# Patient Record
Sex: Male | Born: 1945 | Race: White | Hispanic: No | Smoking: Current every day smoker
Health system: Southern US, Community
[De-identification: ages and names within clinical notes are randomized; demographics above are authoritative.]

## PROBLEM LIST (undated history)

## (undated) DIAGNOSIS — I350 Nonrheumatic aortic (valve) stenosis: Secondary | ICD-10-CM

## (undated) DIAGNOSIS — I10 Essential (primary) hypertension: Secondary | ICD-10-CM

## (undated) DIAGNOSIS — F32A Depression, unspecified: Secondary | ICD-10-CM

## (undated) DIAGNOSIS — I779 Disorder of arteries and arterioles, unspecified: Secondary | ICD-10-CM

## (undated) DIAGNOSIS — K219 Gastro-esophageal reflux disease without esophagitis: Secondary | ICD-10-CM

## (undated) DIAGNOSIS — F419 Anxiety disorder, unspecified: Secondary | ICD-10-CM

## (undated) DIAGNOSIS — R011 Cardiac murmur, unspecified: Secondary | ICD-10-CM

## (undated) DIAGNOSIS — J189 Pneumonia, unspecified organism: Secondary | ICD-10-CM

## (undated) DIAGNOSIS — I739 Peripheral vascular disease, unspecified: Secondary | ICD-10-CM

## (undated) DIAGNOSIS — K5790 Diverticulosis of intestine, part unspecified, without perforation or abscess without bleeding: Secondary | ICD-10-CM

## (undated) DIAGNOSIS — I700 Atherosclerosis of aorta: Secondary | ICD-10-CM

## (undated) DIAGNOSIS — G629 Polyneuropathy, unspecified: Secondary | ICD-10-CM

## (undated) DIAGNOSIS — I509 Heart failure, unspecified: Secondary | ICD-10-CM

## (undated) DIAGNOSIS — E119 Type 2 diabetes mellitus without complications: Secondary | ICD-10-CM

## (undated) DIAGNOSIS — N529 Male erectile dysfunction, unspecified: Secondary | ICD-10-CM

## (undated) DIAGNOSIS — J449 Chronic obstructive pulmonary disease, unspecified: Secondary | ICD-10-CM

## (undated) DIAGNOSIS — H409 Unspecified glaucoma: Secondary | ICD-10-CM

## (undated) DIAGNOSIS — R0600 Dyspnea, unspecified: Secondary | ICD-10-CM

## (undated) DIAGNOSIS — Z8639 Personal history of other endocrine, nutritional and metabolic disease: Secondary | ICD-10-CM

## (undated) DIAGNOSIS — M1990 Unspecified osteoarthritis, unspecified site: Secondary | ICD-10-CM

## (undated) DIAGNOSIS — Z8719 Personal history of other diseases of the digestive system: Secondary | ICD-10-CM

## (undated) HISTORY — DX: Diverticulosis of intestine, part unspecified, without perforation or abscess without bleeding: K57.90

## (undated) HISTORY — PX: CARDIAC CATHETERIZATION: SHX172

## (undated) HISTORY — PX: EYE SURGERY: SHX253

## (undated) HISTORY — DX: Atherosclerosis of aorta: I70.0

## (undated) HISTORY — DX: Type 2 diabetes mellitus without complications: E11.9

## (undated) HISTORY — DX: Disorder of arteries and arterioles, unspecified: I77.9

## (undated) HISTORY — DX: Essential (primary) hypertension: I10

## (undated) HISTORY — DX: Male erectile dysfunction, unspecified: N52.9

## (undated) HISTORY — PX: TONSILLECTOMY: SUR1361

## (undated) HISTORY — DX: Peripheral vascular disease, unspecified: I73.9

## (undated) HISTORY — DX: Nonrheumatic aortic (valve) stenosis: I35.0

## (undated) HISTORY — PX: HERNIA REPAIR: SHX51

---

## 2009-06-06 ENCOUNTER — Ambulatory Visit (HOSPITAL_COMMUNITY): Admission: RE | Admit: 2009-06-06 | Discharge: 2009-06-06 | Payer: Self-pay | Admitting: Family Medicine

## 2013-03-23 NOTE — H&P (Signed)
  NTS SOAP Note  Vital Signs:  Vitals as of: 03/23/2013: Systolic 198: Diastolic 119: Heart Rate 68: Temp 37F: Height 38ft 1in: Weight 174Lbs 0 Ounces: BMI 22.96  BMI : 22.96 kg/m2  Subjective: This 67 Years 31 Months old Male presents for screening TCS.  Never has had a colonoscopy.  Denies any significant gi complaints.  No family h/o colon cancer.   Review of Symptoms:  Constitutional:unremarkable   Head:unremarkable    Eyes:unremarkable   Nose/Mouth/Throat:unremarkable Cardiovascular:  unremarkable   Respiratory:  dyspnea,cough Gastrointestin    heartburn Genitourinary:    frequency Musculoskeletal:unremarkable   Skin:unremarkable Hematolgic/Lymphatic:unremarkable     Allergic/Immunologic:unremarkable     Past Medical History:    Reviewed   Past Medical History  Surgical History: hernia, hip dislocation Medical Problems:  Diabetes, High Blood pressure Allergies: nkda Medications: enalapril, timolol, lantanoprost, metformin, glipizide   Social History:Reviewed  Social History  Preferred Language: English Race:  White Ethnicity: Not Hispanic / Latino Age: 67 Years 6 Months Marital Status:  S Alcohol:  No Recreational drug(s):  No   Smoking Status: Current every day smoker reviewed on 03/23/2013 Started Date: [birth year]/30/1965 Packs per day: 2.00 Functional Status reviewed on mm/dd/yyyy ------------------------------------------------ Bathing: Normal Cooking: Normal Dressing: Normal Driving: Normal Eating: Normal Managing Meds: Normal Oral Care: Normal Shopping: Normal Toileting: Normal Transferring: Normal Walking: Normal Cognitive Status reviewed on mm/dd/yyyy ------------------------------------------------ Attention: Normal Decision Making: Normal Language: Normal Memory: Normal Motor: Normal Perception: Normal Problem Solving: Normal Visual and Spatial: Normal   Family History:  Reviewed   Family  History  Is there a family history of:No family h/o colon cancer    Objective Information: General:  Well appearing, well nourished in no distress. Heart:  RRR, no murmur Lungs:    CTA bilaterally, no wheezes, rhonchi, rales.  Breathing unlabored. Abdomen:Soft, NT/ND, no HSM, no masses.   deferred to procedure  Assessment:Need for screening TCS  Diagnosis &amp; Procedure Smart Code   Plan:Scheduled for TCS on 04/06/13.   Patient Education:Alternative treatments to surgery were discussed with patient (and family).  Risks and benefits  of procedure including bleeding and perforation were fully explained to the patient (and family) who gave informed consent. Patient/family questions were addressed.  Follow-up:Pending Surgery

## 2013-03-25 ENCOUNTER — Encounter (HOSPITAL_COMMUNITY): Payer: Self-pay | Admitting: Pharmacy Technician

## 2013-04-06 ENCOUNTER — Encounter (HOSPITAL_COMMUNITY): Payer: Self-pay

## 2013-04-06 ENCOUNTER — Ambulatory Visit (HOSPITAL_COMMUNITY)
Admission: RE | Admit: 2013-04-06 | Discharge: 2013-04-06 | Disposition: A | Discharge status: Home / Self Care | Payer: Medicare Other | Source: Ambulatory Visit | Attending: General Surgery | Admitting: General Surgery

## 2013-04-06 ENCOUNTER — Encounter (HOSPITAL_COMMUNITY)
Admission: RE | Disposition: A | Discharge status: Home / Self Care | Payer: Self-pay | Source: Ambulatory Visit | Attending: General Surgery

## 2013-04-06 DIAGNOSIS — K5730 Diverticulosis of large intestine without perforation or abscess without bleeding: Secondary | ICD-10-CM | POA: Insufficient documentation

## 2013-04-06 DIAGNOSIS — E119 Type 2 diabetes mellitus without complications: Secondary | ICD-10-CM | POA: Insufficient documentation

## 2013-04-06 DIAGNOSIS — I10 Essential (primary) hypertension: Secondary | ICD-10-CM | POA: Insufficient documentation

## 2013-04-06 DIAGNOSIS — D126 Benign neoplasm of colon, unspecified: Secondary | ICD-10-CM | POA: Insufficient documentation

## 2013-04-06 DIAGNOSIS — Z01812 Encounter for preprocedural laboratory examination: Secondary | ICD-10-CM | POA: Insufficient documentation

## 2013-04-06 DIAGNOSIS — D128 Benign neoplasm of rectum: Secondary | ICD-10-CM | POA: Insufficient documentation

## 2013-04-06 DIAGNOSIS — Z1211 Encounter for screening for malignant neoplasm of colon: Secondary | ICD-10-CM | POA: Insufficient documentation

## 2013-04-06 HISTORY — DX: Type 2 diabetes mellitus without complications: E11.9

## 2013-04-06 HISTORY — DX: Chronic obstructive pulmonary disease, unspecified: J44.9

## 2013-04-06 HISTORY — DX: Personal history of other diseases of the digestive system: Z87.19

## 2013-04-06 HISTORY — PX: COLONOSCOPY: SHX5424

## 2013-04-06 HISTORY — DX: Essential (primary) hypertension: I10

## 2013-04-06 HISTORY — DX: Gastro-esophageal reflux disease without esophagitis: K21.9

## 2013-04-06 SURGERY — COLONOSCOPY
Anesthesia: Moderate Sedation

## 2013-04-06 MED ORDER — MEPERIDINE HCL 25 MG/ML IJ SOLN
INTRAMUSCULAR | Status: DC | PRN
  Administered 2013-04-06: 50 mg via INTRAVENOUS

## 2013-04-06 MED ORDER — MEPERIDINE HCL 50 MG/ML IJ SOLN
INTRAMUSCULAR | Status: AC
  Filled 2013-04-06: qty 1

## 2013-04-06 MED ORDER — MIDAZOLAM HCL 5 MG/5ML IJ SOLN
INTRAMUSCULAR | Status: AC
  Filled 2013-04-06: qty 5

## 2013-04-06 MED ORDER — STERILE WATER FOR IRRIGATION IR SOLN
Status: DC | PRN
  Administered 2013-04-06: 09:00:00

## 2013-04-06 MED ORDER — MIDAZOLAM HCL 5 MG/5ML IJ SOLN
INTRAMUSCULAR | Status: DC | PRN
  Administered 2013-04-06: 1 mg via INTRAVENOUS
  Administered 2013-04-06: 4 mg via INTRAVENOUS

## 2013-04-06 MED ORDER — SODIUM CHLORIDE 0.9 % IV SOLN
INTRAVENOUS | Status: DC
  Administered 2013-04-06: 09:00:00 via INTRAVENOUS

## 2013-04-06 NOTE — Op Note (Signed)
[facility] [address] [place], [address]   COLONOSCOPY PROCEDURE REPORT  PATIENT: Patrick, Beck  MR#: [id number] BIRTHDATE: 01-25-1946 , 66  yrs. old GENDER: Male ENDOSCOPIST: Franky Macho, MD REFERRED [id number], Brett Canales PROCEDURE DATE:  04/06/2013 PROCEDURE:   Colonoscopy with snare polypectomy ASA CLASS:   Class II INDICATIONS:Average risk patient for colon cancer. MEDICATIONS: Versed 5 mg IV and Demerol 50 mg IV  DESCRIPTION OF PROCEDURE:   After the risks benefits and alternatives of the procedure were thoroughly explained, informed consent was obtained.  A digital rectal exam revealed a palpable rectal mass.   The EC-[id number] ([id number])  endoscope was introduced through the anus and advanced to the cecum, which was identified by both the appendix and ileocecal valve. No adverse events experienced.   The quality of the prep was adequate, using MoviPrep The instrument was then slowly withdrawn as the colon was fully examined.      COLON FINDINGS: A small sessile polyp was found in the proximal transverse colon.  A polypectomy was performed using snare cautery. The resection was complete and the polyp tissue was completely retrieved.   A small sessile polyp was found in the distal transverse colon.  A polypectomy was performed using snare cautery. The resection was complete and the polyp tissue was completely retrieved.   A large polypoid shaped pedunculated polyp was found in rectum seen upon the retroflexed view.  A polypectomy was performed using snare cautery.  The resection was complete and the polyp tissue was completely retrieved.   There was mild scattered diverticulosis noted throughout the entire examined colon.     The time to cecum=7 minutes 0 seconds.  Withdrawal time=32 minutes 0 seconds.  The scope was withdrawn and the procedure completed. COMPLICATIONS: There were no complications.  ENDOSCOPIC IMPRESSION: 1.   Small sessile polyp was  found in the proximal transverse colon; polypectomy was performed using snare cautery 2.   Small sessile polyp was found in the distal transverse colon; polypectomy was performed using snare cautery 3.   Large pedunculated polyp was found in rectum seen upon the retroflexed view; polypectomy was performed using snare cautery 4.   There was mild diverticulosis noted throughout the entire examined colon  RECOMMENDATIONS: 1.  Await pathology results 2.  Timing of repeat colonoscopy will be determined by pathology findings.   eSigned:  Franky Macho, MD 04/06/2013 10:13 AM   cc:   PATIENT NAME:  Patrick, Beck MR#: [id number]

## 2013-04-06 NOTE — Interval H&P Note (Signed)
History and Physical Interval Note:  04/06/2013 9:12 AM  Patrick Beck  has presented today for surgery, with the diagnosis of screening  The various methods of treatment have been discussed with the patient and family. After consideration of risks, benefits and other options for treatment, the patient has consented to  Procedure(s): COLONOSCOPY (N/A) as a surgical intervention .  The patient's history has been reviewed, patient examined, no change in status, stable for surgery.  I have reviewed the patient's chart and labs.  Questions were answered to the patient's satisfaction.     Franky Macho A

## 2013-04-12 ENCOUNTER — Encounter (HOSPITAL_COMMUNITY): Payer: Self-pay | Admitting: General Surgery

## 2015-09-17 ENCOUNTER — Emergency Department (HOSPITAL_COMMUNITY): Payer: Medicare Other

## 2015-09-17 ENCOUNTER — Encounter (HOSPITAL_COMMUNITY): Payer: Self-pay | Admitting: Emergency Medicine

## 2015-09-17 ENCOUNTER — Emergency Department (HOSPITAL_COMMUNITY)
Admission: EM | Admit: 2015-09-17 | Discharge: 2015-09-17 | Disposition: A | Discharge status: Home / Self Care | Payer: Medicare Other | Attending: Emergency Medicine | Admitting: Emergency Medicine

## 2015-09-17 DIAGNOSIS — Z7982 Long term (current) use of aspirin: Secondary | ICD-10-CM | POA: Insufficient documentation

## 2015-09-17 DIAGNOSIS — I10 Essential (primary) hypertension: Secondary | ICD-10-CM | POA: Diagnosis not present

## 2015-09-17 DIAGNOSIS — M25562 Pain in left knee: Secondary | ICD-10-CM

## 2015-09-17 DIAGNOSIS — E119 Type 2 diabetes mellitus without complications: Secondary | ICD-10-CM | POA: Diagnosis not present

## 2015-09-17 DIAGNOSIS — Z720 Tobacco use: Secondary | ICD-10-CM | POA: Insufficient documentation

## 2015-09-17 DIAGNOSIS — W010XXA Fall on same level from slipping, tripping and stumbling without subsequent striking against object, initial encounter: Secondary | ICD-10-CM | POA: Insufficient documentation

## 2015-09-17 DIAGNOSIS — Z8719 Personal history of other diseases of the digestive system: Secondary | ICD-10-CM | POA: Diagnosis not present

## 2015-09-17 DIAGNOSIS — Y9389 Activity, other specified: Secondary | ICD-10-CM | POA: Insufficient documentation

## 2015-09-17 DIAGNOSIS — Y9289 Other specified places as the place of occurrence of the external cause: Secondary | ICD-10-CM | POA: Diagnosis not present

## 2015-09-17 DIAGNOSIS — J449 Chronic obstructive pulmonary disease, unspecified: Secondary | ICD-10-CM | POA: Insufficient documentation

## 2015-09-17 DIAGNOSIS — Y998 Other external cause status: Secondary | ICD-10-CM | POA: Diagnosis not present

## 2015-09-17 DIAGNOSIS — Z79899 Other long term (current) drug therapy: Secondary | ICD-10-CM | POA: Diagnosis not present

## 2015-09-17 DIAGNOSIS — S8992XA Unspecified injury of left lower leg, initial encounter: Secondary | ICD-10-CM | POA: Diagnosis present

## 2015-09-17 DIAGNOSIS — W19XXXA Unspecified fall, initial encounter: Secondary | ICD-10-CM

## 2015-09-17 NOTE — Discharge Instructions (Signed)
Crutches, ice, elevate, Tylenol. Recommend knee brace at [facility] or [facility].  Follow-up with orthopedic doctor if not getting better. Phone number given.

## 2015-09-17 NOTE — ED Provider Notes (Signed)
CSN: [id number]     Arrival date & time 09/17/15  8341 History  This chart was scribed for Nat Christen, MD by Stephania Fragmin, ED Scribe. This patient was seen in room APA04/APA04 and the patient's care was started at 9:21 AM.   Chief Complaint  Patient presents with  . Fall   The history is provided by the patient. No language interpreter was used.    HPI Comments: Lawrnce Beck is a 69 y.o. male with a history of HTN, COPD, and DM, who presents to the Emergency Department S/P tripping and falling yesterday afternoon, complaining of constant, moderate, worsening left knee pain and swelling. Patient states he tripped over wires in his friend's backyard from standing height, falling on both knees and left wrist. He denies any other injuries, including wrist pain or any complaints with his right knee.   Past Medical History  Diagnosis Date  . Hypertension   . COPD (chronic obstructive pulmonary disease)   . Diabetes mellitus without complication   . GERD (gastroesophageal reflux disease)   . H/O hiatal hernia    Past Surgical History  Procedure Laterality Date  . Hernia repair      times 3 all before 1966  . Colonoscopy N/A 04/06/2013    Procedure: COLONOSCOPY;  Surgeon: Jamesetta So, MD;  Location: [facility];  Service: Gastroenterology;  Laterality: N/A;   History reviewed. No pertinent family history. Social History  Substance Use Topics  . Smoking status: Heavy Tobacco Smoker -- 2.00 packs/day for 50 years    Types: Cigarettes  . Smokeless tobacco: None  . Alcohol Use: No    Review of Systems  Musculoskeletal: Positive for joint swelling and arthralgias (left knee).  All other systems reviewed and are negative.  Allergies  Macadamia nut oil  Home Medications   Prior to Admission medications   Medication Sig Start Date End Date Taking? Authorizing Avion Patella  aspirin 325 MG EC tablet Take 325 mg by mouth daily.   Yes Historical Vitalia Stough, MD  enalapril (VASOTEC) 10 MG tablet  Take 5 mg by mouth 2 (two) times daily.    Yes Historical Ayumi Wangerin, MD  glimepiride (AMARYL) 2 MG tablet Take 2 mg by mouth daily before breakfast.   Yes Historical Gerardine Peltz, MD  ibuprofen (ADVIL,MOTRIN) 200 MG tablet Take 800 mg by mouth every 6 (six) hours as needed for mild pain.   Yes Historical Umar Patmon, MD  metFORMIN (GLUCOPHAGE) 1000 MG tablet Take 1,000 mg by mouth 2 (two) times daily with a meal.   Yes Historical Hajra Port, MD  pravastatin (PRAVACHOL) 40 MG tablet Take 40 mg by mouth daily.   Yes Historical Aubrie Lucien, MD  timolol (BETIMOL) 0.5 % ophthalmic solution Place 1 drop into both eyes daily.   Yes Historical Senie Lanese, MD   BP 146/93 mmHg  Pulse 70  Temp(Src) 97.9 F (36.6 C) (Oral)  Resp 18  Ht 6\' 1"  (1.854 m)  Wt 166 lb (75.297 kg)  BMI 21.91 kg/m2  SpO2 97% Physical Exam  Constitutional: He is oriented to person, place, and time. He appears well-developed and well-nourished. No distress.  HENT:  Head: Normocephalic and atraumatic.  Eyes: Conjunctivae and EOM are normal.  Neck: Neck supple. No tracheal deviation present.  Cardiovascular: Normal rate.   Pulmonary/Chest: Effort normal. No respiratory distress.  Musculoskeletal: Normal range of motion. He exhibits tenderness.  Swelling in distal femur and distal quadriceps. Diffusely tender throughout knee.   Neurological: He is alert and oriented to person, place, and  time.  Skin: Skin is warm and dry.  Psychiatric: He has a normal mood and affect. His behavior is normal.  Nursing note and vitals reviewed.   ED Course  Procedures (including critical care time)  DIAGNOSTIC STUDIES: Oxygen Saturation is 100% on RA, normal by my interpretation.    COORDINATION OF CARE: 9:31 AM - Discussed treatment plan with pt at bedside which includes elevation, ice, pain management, rest, and time. Will perform left knee XR to r/o bony involvement.  Pt verbalized understanding and agreed to plan.   10:57 AM - XR findings  reviewed with patient. Reiterated treatment plan with pt at bedside. Will give crutches. Will also prescribe Rx Flexeril for pain. Pt verbalized understanding and agreed to plan.  Imaging Review No results found. I have personally reviewed and evaluated these images and lab results as part of my medical decision-making.  MDM   Final diagnoses:  Fall, initial encounter  Left knee pain   x-ray of left knee shows a joint effusion with no fracture. Ice, elevate, compression, referral to orthopedics.  I personally performed the services described in this documentation, which was scribed in my presence. The recorded information has been reviewed and is accurate.     Nat Christen, MD 09/19/15 [phone]

## 2015-09-17 NOTE — ED Notes (Signed)
Fell yesterday on both knees but only c/o pain to left knee.  Rates pain 8/10.  Took Advil without relief.  Caught self with right hand and both knees.

## 2015-09-17 NOTE — ED Notes (Signed)
Pt made aware to return if symptoms worsen or if any life threatening symptoms occur.   

## 2017-10-15 ENCOUNTER — Other Ambulatory Visit (HOSPITAL_COMMUNITY): Payer: Self-pay | Admitting: Family Medicine

## 2017-10-15 ENCOUNTER — Ambulatory Visit (HOSPITAL_COMMUNITY)
Admission: RE | Admit: 2017-10-15 | Discharge: 2017-10-15 | Disposition: A | Discharge status: Home / Self Care | Payer: Medicare Other | Source: Ambulatory Visit | Attending: Family Medicine | Admitting: Family Medicine

## 2017-10-15 DIAGNOSIS — J449 Chronic obstructive pulmonary disease, unspecified: Secondary | ICD-10-CM | POA: Diagnosis not present

## 2017-10-15 DIAGNOSIS — R799 Abnormal finding of blood chemistry, unspecified: Secondary | ICD-10-CM

## 2017-10-15 DIAGNOSIS — R918 Other nonspecific abnormal finding of lung field: Secondary | ICD-10-CM | POA: Insufficient documentation

## 2017-10-21 ENCOUNTER — Other Ambulatory Visit (HOSPITAL_COMMUNITY): Payer: Self-pay | Admitting: Family Medicine

## 2017-10-21 DIAGNOSIS — R9389 Abnormal findings on diagnostic imaging of other specified body structures: Secondary | ICD-10-CM

## 2017-10-21 DIAGNOSIS — B59 Pneumocystosis: Secondary | ICD-10-CM

## 2017-10-22 ENCOUNTER — Ambulatory Visit (HOSPITAL_COMMUNITY)
Admission: RE | Admit: 2017-10-22 | Discharge: 2017-10-22 | Disposition: A | Discharge status: Home / Self Care | Payer: Medicare Other | Source: Ambulatory Visit | Attending: Family Medicine | Admitting: Family Medicine

## 2017-10-22 ENCOUNTER — Encounter (HOSPITAL_COMMUNITY): Payer: Self-pay

## 2017-10-22 ENCOUNTER — Inpatient Hospital Stay (HOSPITAL_COMMUNITY)
Admission: AD | Admit: 2017-10-22 | Discharge: 2017-10-24 | DRG: 194 | Disposition: A | Discharge status: Home / Self Care | Payer: Medicare Other | Source: Ambulatory Visit | Attending: Family Medicine | Admitting: Family Medicine

## 2017-10-22 DIAGNOSIS — Z23 Encounter for immunization: Secondary | ICD-10-CM | POA: Diagnosis not present

## 2017-10-22 DIAGNOSIS — R9389 Abnormal findings on diagnostic imaging of other specified body structures: Secondary | ICD-10-CM

## 2017-10-22 DIAGNOSIS — F17200 Nicotine dependence, unspecified, uncomplicated: Secondary | ICD-10-CM | POA: Diagnosis not present

## 2017-10-22 DIAGNOSIS — J984 Other disorders of lung: Secondary | ICD-10-CM

## 2017-10-22 DIAGNOSIS — Z7982 Long term (current) use of aspirin: Secondary | ICD-10-CM | POA: Diagnosis not present

## 2017-10-22 DIAGNOSIS — E119 Type 2 diabetes mellitus without complications: Secondary | ICD-10-CM | POA: Diagnosis present

## 2017-10-22 DIAGNOSIS — J440 Chronic obstructive pulmonary disease with acute lower respiratory infection: Secondary | ICD-10-CM | POA: Diagnosis present

## 2017-10-22 DIAGNOSIS — I10 Essential (primary) hypertension: Secondary | ICD-10-CM | POA: Diagnosis present

## 2017-10-22 DIAGNOSIS — J189 Pneumonia, unspecified organism: Principal | ICD-10-CM | POA: Diagnosis present

## 2017-10-22 DIAGNOSIS — Z66 Do not resuscitate: Secondary | ICD-10-CM | POA: Diagnosis present

## 2017-10-22 DIAGNOSIS — J449 Chronic obstructive pulmonary disease, unspecified: Secondary | ICD-10-CM | POA: Diagnosis not present

## 2017-10-22 DIAGNOSIS — B59 Pneumocystosis: Secondary | ICD-10-CM

## 2017-10-22 DIAGNOSIS — H409 Unspecified glaucoma: Secondary | ICD-10-CM | POA: Diagnosis present

## 2017-10-22 DIAGNOSIS — F17210 Nicotine dependence, cigarettes, uncomplicated: Secondary | ICD-10-CM | POA: Diagnosis present

## 2017-10-22 DIAGNOSIS — Z79899 Other long term (current) drug therapy: Secondary | ICD-10-CM | POA: Diagnosis not present

## 2017-10-22 DIAGNOSIS — Z825 Family history of asthma and other chronic lower respiratory diseases: Secondary | ICD-10-CM

## 2017-10-22 DIAGNOSIS — Z809 Family history of malignant neoplasm, unspecified: Secondary | ICD-10-CM | POA: Diagnosis not present

## 2017-10-22 DIAGNOSIS — K219 Gastro-esophageal reflux disease without esophagitis: Secondary | ICD-10-CM | POA: Diagnosis present

## 2017-10-22 DIAGNOSIS — Z7984 Long term (current) use of oral hypoglycemic drugs: Secondary | ICD-10-CM | POA: Diagnosis not present

## 2017-10-22 HISTORY — DX: Unspecified glaucoma: H40.9

## 2017-10-22 LAB — BASIC METABOLIC PANEL
ANION GAP: 11 (ref 5–15)
BUN: 18 mg/dL (ref 6–20)
CHLORIDE: 96 mmol/L — AB (ref 101–111)
CO2: 27 mmol/L (ref 22–32)
Calcium: 8.1 mg/dL — ABNORMAL LOW (ref 8.9–10.3)
Creatinine, Ser: 0.99 mg/dL (ref 0.61–1.24)
GFR calc non Af Amer: >60 mL/min (ref >60)
Glucose, Bld: 103 mg/dL — ABNORMAL HIGH (ref 65–99)
Potassium: 3.7 mmol/L (ref 3.5–5.1)
Sodium: 134 mmol/L — ABNORMAL LOW (ref 135–145)

## 2017-10-22 LAB — EXPECTORATED SPUTUM ASSESSMENT W GRAM STAIN, RFLX TO RESP C: Special Requests: NORMAL

## 2017-10-22 LAB — GLUCOSE, CAPILLARY: Glucose-Capillary: 176 mg/dL — ABNORMAL HIGH (ref 65–99)

## 2017-10-22 MED ORDER — ENOXAPARIN SODIUM 40 MG/0.4ML ~~LOC~~ SOLN
40.0000 mg | SUBCUTANEOUS | Status: DC
  Administered 2017-10-22 – 2017-10-23 (×2): 40 mg via SUBCUTANEOUS
  Filled 2017-10-22 (×2): qty 0.4

## 2017-10-22 MED ORDER — INSULIN ASPART 100 UNIT/ML ~~LOC~~ SOLN: 0.0000 [IU] | Freq: Every day | SUBCUTANEOUS | Status: DC

## 2017-10-22 MED ORDER — IOPAMIDOL (ISOVUE-300) INJECTION 61%
75.0000 mL | Freq: Once | INTRAVENOUS | Status: AC | PRN
  Administered 2017-10-22: 75 mL via INTRAVENOUS

## 2017-10-22 MED ORDER — INSULIN ASPART 100 UNIT/ML ~~LOC~~ SOLN
0.0000 [IU] | Freq: Three times a day (TID) | SUBCUTANEOUS | Status: DC
  Administered 2017-10-23: 2 [IU] via SUBCUTANEOUS
  Administered 2017-10-23: 5 [IU] via SUBCUTANEOUS
  Administered 2017-10-24: 2 [IU] via SUBCUTANEOUS

## 2017-10-22 MED ORDER — PIPERACILLIN-TAZOBACTAM 3.375 G IVPB
3.3750 g | Freq: Three times a day (TID) | INTRAVENOUS | Status: DC
  Administered 2017-10-22 – 2017-10-24 (×5): 3.375 g via INTRAVENOUS
  Filled 2017-10-22 (×6): qty 50

## 2017-10-22 MED ORDER — ALBUTEROL SULFATE (2.5 MG/3ML) 0.083% IN NEBU: 2.5000 mg | INHALATION_SOLUTION | RESPIRATORY_TRACT | Status: DC | PRN

## 2017-10-22 MED ORDER — PNEUMOCOCCAL VAC POLYVALENT 25 MCG/0.5ML IJ INJ
0.5000 mL | INJECTION | INTRAMUSCULAR | Status: AC
  Administered 2017-10-23: 0.5 mL via INTRAMUSCULAR

## 2017-10-22 MED ORDER — IPRATROPIUM-ALBUTEROL 0.5-2.5 (3) MG/3ML IN SOLN
3.0000 mL | Freq: Four times a day (QID) | RESPIRATORY_TRACT | Status: DC
  Administered 2017-10-22 – 2017-10-23 (×2): 3 mL via RESPIRATORY_TRACT
  Filled 2017-10-22 (×3): qty 3

## 2017-10-22 MED ORDER — PRAVASTATIN SODIUM 40 MG PO TABS
40.0000 mg | ORAL_TABLET | Freq: Every day | ORAL | Status: DC
  Administered 2017-10-23 – 2017-10-24 (×2): 40 mg via ORAL
  Filled 2017-10-22 (×2): qty 1

## 2017-10-22 MED ORDER — LATANOPROST 0.005 % OP SOLN
1.0000 [drp] | Freq: Every day | OPHTHALMIC | Status: DC
  Administered 2017-10-23: 1 [drp] via OPHTHALMIC
  Filled 2017-10-22: qty 2.5

## 2017-10-22 MED ORDER — TIMOLOL MALEATE 0.5 % OP SOLN
1.0000 [drp] | Freq: Every day | OPHTHALMIC | Status: DC
  Filled 2017-10-22: qty 5

## 2017-10-22 MED ORDER — ASPIRIN EC 325 MG PO TBEC
325.0000 mg | DELAYED_RELEASE_TABLET | Freq: Every day | ORAL | Status: DC
  Administered 2017-10-22 – 2017-10-24 (×3): 325 mg via ORAL
  Filled 2017-10-22 (×3): qty 1

## 2017-10-22 MED ORDER — SODIUM CHLORIDE 0.9 % IV SOLN
INTRAVENOUS | Status: DC
  Administered 2017-10-22 – 2017-10-24 (×2): via INTRAVENOUS

## 2017-10-22 MED ORDER — GUAIFENESIN-CODEINE 100-10 MG/5ML PO SOLN
10.0000 mL | ORAL | Status: DC | PRN
  Administered 2017-10-23 – 2017-10-24 (×3): 10 mL via ORAL
  Filled 2017-10-22 (×3): qty 10

## 2017-10-22 MED ORDER — TIMOLOL HEMIHYDRATE 0.5 % OP SOLN: 1.0000 [drp] | Freq: Every day | OPHTHALMIC | Status: DC

## 2017-10-22 MED ORDER — ASPIRIN EC 325 MG PO TBEC: 325.0000 mg | DELAYED_RELEASE_TABLET | Freq: Every day | ORAL | Status: DC

## 2017-10-22 NOTE — Progress Notes (Signed)
Dr. Lorin Mercy paged and made aware that no abx were ordered for this pt with diagnosis of PNA. Waiting for orders.

## 2017-10-22 NOTE — H&P (Signed)
History and Physical    Ammar Moffatt YKD:[id number] DOB: [birth year] DOA: 10/22/2017  PCP: Lemmie Evens, MD Consultants:  Gershon Crane - ophthalmology Patient coming from:  Home - lives with wife; [facility]: wife, [phone]  Chief Complaint: chest congestion  HPI: Patrick Beck is a 71 y.o. male with medical history significant of HTN, Glaucoma, GERD, DM, and COPD (not on home O2) presenting with chest congestion, getting worse and worse and worse.  Symptoms started about a month ago. He saw Dr. Karie Kirks when his symptoms started and his BP was 93/63.  He did not receive antibiotics at that time.  He reports that it "feels like a pleurisy", which he has had on and off for years, but this is the worst it's ever been and the longest it's ever lasted.  He usually treats it with Theraflu and Advil with resolution after a couple of days, but this time it is not getting better and actually getting worse.  He ended up with chest x-rays and then a CT.   + Bad night sweats, bad chills occasionally for 45-60 mintues.  ?fever - no thermometer.  Weight loss but also anorexia.  +productive cough - either very thick and clear or very thick and cloudy white.  No blood in his sputum.  No sick contacts.    +SOB with ambulation.  When he walks the dog outside, he has to sit outside for 15 minutes before he can take her back in; this has been getting worse.  He was given Levaquin 2 days ago and took it the last 2 days (none today) without improvement.  He was seen again by Dr. Karie Kirks today after his CT and was sent for direct admission.    Review of Systems: As per HPI; otherwise review of systems reviewed and negative.   Ambulatory Status:  Ambulates without assistance  Past Medical History:  Diagnosis Date  . COPD (chronic obstructive pulmonary disease) (HCC)    not on home O2  . Diabetes mellitus without complication ([place])   . GERD (gastroesophageal reflux disease)   . Glaucoma   . H/O hiatal hernia   .  Hypertension     Past Surgical History:  Procedure Laterality Date  . COLONOSCOPY N/A 04/06/2013   Procedure: COLONOSCOPY;  Surgeon: Jamesetta So, MD;  Location: [facility];  Service: Gastroenterology;  Laterality: N/A;  . HERNIA REPAIR     times 3 all before 1966    Social History   Social History  . Marital status: Divorced    Spouse name: N/A  . Number of children: N/A  . Years of education: N/A   Occupational History  . retired    Social History Main Topics  . Smoking status: Heavy Tobacco Smoker    Packs/day: 2.00    Years: 56.00    Types: Cigarettes  . Smokeless tobacco: Never Used     Comment: pt refuses hand outs   . Alcohol use No  . Drug use: No  . Sexual activity: Not on file   Other Topics Concern  . Not on file   Social History Narrative  . No narrative on file    Allergies  Allergen Reactions  . Macadamia Nut Oil     Throat swelling and can't breathe    Family History  Problem Relation Age of Onset  . Cancer Neg Hx     Prior to Admission medications   Medication Sig Start Date End Date Taking? Authorizing Provider  aspirin 325 MG EC  tablet Take 325 mg by mouth daily.   Yes [provider]  enalapril (VASOTEC) 10 MG tablet Take 5 mg by mouth 2 (two) times daily.    Yes [provider]  glimepiride (AMARYL) 2 MG tablet Take 2 mg by mouth daily before breakfast.   Yes [provider]  metFORMIN (GLUCOPHAGE) 1000 MG tablet Take 1,000 mg by mouth 2 (two) times daily with a meal.   Yes [provider]  pravastatin (PRAVACHOL) 40 MG tablet Take 40 mg by mouth daily.   Yes [provider]  timolol (BETIMOL) 0.5 % ophthalmic solution Place 1 drop into both eyes daily.   Yes [provider]  ibuprofen (ADVIL,MOTRIN) 200 MG tablet Take 800 mg by mouth every 6 (six) hours as needed for mild pain.    [provider]    Physical Exam: Vitals:   10/22/17 1620  BP: 116/68  Pulse: 99    Resp: 12  Temp: 98.2 F (36.8 C)  TempSrc: Oral  SpO2: 96%  Weight: 65.1 kg (143 lb 7 oz)  Height: 6\' 1"  (1.854 m)     General:  Appears calm and comfortable and is NAD Eyes:  PERRL, EOMI, normal lids, iris ENT:  grossly normal hearing, lips & tongue, mmm; very poor dentition but without apparent abscess Neck:  no LAD, masses or thyromegaly Cardiovascular:  RRR, no m/r/g. No LE edema.  Respiratory:   CTA bilaterally with no wheezes/rales/rhonchi. Diminished air movement in LUL.  Normal respiratory effort. Abdomen:  soft, NT, ND, NABS Back:   normal alignment, no CVAT Skin:  no rash or induration seen on limited exam Musculoskeletal:  grossly normal tone BUE/BLE, good ROM, no bony abnormality Lower extremity:  Trace pedal edema. 2+ distal pulses. Psychiatric:  grossly normal mood and affect, speech fluent and appropriate, AOx3 Neurologic:  CN 2-12 grossly intact, moves all extremities in coordinated fashion, sensation intact    Radiological Exams on Admission: Ct Chest W Contrast  Result Date: 10/22/2017 CLINICAL DATA:  Abnormal chest radiograph EXAM: CT CHEST WITH CONTRAST TECHNIQUE: Multidetector CT imaging of the chest was performed during intravenous contrast administration. CONTRAST:  73mL ISOVUE-300 IOPAMIDOL (ISOVUE-300) INJECTION 61% COMPARISON:  10/15/2017 FINDINGS: Cardiovascular: The heart size appears within normal. No pericardial effusion identified. Calcification in the LAD coronary artery identified. No pericardial effusion. Mediastinum/Nodes: Right paratracheal node is enlarged measuring 2.2 cm. There is a left-sided pre-vascular lymph node which measures 1.3 cm, image 73 of series 2. Enlarged left hilar node measures 1.4 cm, image 86 of series 2. No enlarged supraclavicular or axillary adenopathy. The trachea is patent and appears midline. Normal appearance of the esophagus. Lungs/Pleura: Moderate changes of centrilobular paraseptal emphysema. No pleural effusion.  There is a large area of masslike consolidation with central cavitation involving the anterior and anteromedial left upper lobe measuring 7.6 x 10.6 by 9.1 cm. Additional patchy areas of ground-glass attenuation scattered throughout the left lung. A second cavitary process is identified within the central aspect of the posteromedial right lower lobe measuring 3.1 x 1.7 by 1.8 cm. A few scattered subcentimeter solid-appearing nodules are identified in both lungs and are nonspecific. Within the medial right upper lobe there is a solid nodule measuring 5 mm, image 39 of series 4. Upper Abdomen: Low-attenuation structure in the left lobe of liver likely represents a small cyst. There is no acute abnormality noted within the upper abdomen. Musculoskeletal: No aggressive lytic or sclerotic bone lesions identified. IMPRESSION: 1. Large cavitary mass within the  left upper lobe is identified. In the acute setting, in a patient with signs and symptoms of infection, findings are compatible with necrotizing pneumonia. This would be difficult to distinguish from necrotic tumor and followup imaging following appropriate antibiotic therapy is advised to ensure resolution. 2. A second smaller area of cavitation is identified within the central portion of the posterior and medial right lower lobe. Again, findings are compatible with either necrotizing pneumonia or tumor and follow-up imaging is advised following appropriate antibiotic therapy to ensure resolution. 3. Enlarged mediastinal and hilar lymph nodes. In the setting of infection these may be reactive in etiology. Cannot exclude metastatic adenopathy and attention to these nodes on follow-up imaging is advised. 4. Aortic Atherosclerosis (ICD10-I70.0) and Emphysema (ICD10-J43.9). LAD coronary artery calcifications noted. These results were called by telephone at the time of interpretation on 10/22/2017 at 9:44 am to Lenon Oms NP who verbally acknowledged these results.  Electronically Signed   By: Kerby Moors M.D.   On: 10/22/2017 09:46    EKG: not done   Labs on Admission: I have personally reviewed the available labs and imaging studies at the time of the admission.  Pertinent labs:   None   Assessment/Plan Principal Problem:   Cavitary pneumonia Active Problems:   COPD (chronic obstructive pulmonary disease) (HCC)   Tobacco dependence   Type 2 diabetes mellitus without complication (HCC)   Essential hypertension   Glaucoma   Cavitary lung lesion -Persistent respiratory symptoms for about 1 month -Also with B symptoms -CXR on 10/17 concerning for PNA - ordered Levaquin and he took 2 days' worth -CT today shows 2 cavitary lesions with LAD - concerning for PNA vs. Malignancy -He does not have significant symptoms suggestive of PNA such as other infectious symptoms including fever -Aspiration PNA, poor dentition may be contributing factors -His age alone gives him a CURB-65 score of 1 -He is hemodynamically stable and not requiring O2 -Based on 2 days of Levaquin, this marginally qualifies for failure of outpatient therapy. -Will start Zosyn for now for empiric coverage of aspiration PNA and other anaerobic causes of cavitary lesions. -NS @ 75cc/hr -Fever control -Repeat CBC in am -Sputum cultures -Blood cultures -Strep pneumo testing -albuterol PRN -Standing Duonebs -Robitussin AC for cough -In addition to PNA, there is at least as great a concern that this is due to an underlying malignancy. -Will consult Dr. Luan Pulling, pulmonology, as the patient may benefit from bronchoscopy for further evaluation.  COPD -Despite a large tobacco pack-year history, he does not appear to be taking any medications for COPD. -He denies wheezing -Will add Duonebs with prn albuterol to see if this brings improvement to his cough. -No hypoxia.  DM -Will check A1c -hold Glucophage and Amaryl -Cover with sensitive-scale SSI  HTN -Patient brought  in his medication bottles and I reviewed them but did not see enalapril among them -BP is appropriate for now -Will hold enalapril with plan to resume if needed  Tobacco dependence -Tobacco Dependence: encourage cessation.  This was discussed with the patient and should be reviewed on an ongoing basis.   -Patch declined by patient.  Glaucoma -Patient was very clear about latanoprost and had his home bottle at bedside -He did not mention Timolol or have this bottle with him but it was listed by pharmacy on his medication list -will order Timolol for now but if patient refuses will discontinue  DVT prophylaxis: Lovenox Code Status:  DNR - confirmed with patient/family Family Communication: Wife present throughout  evaluation  Disposition Plan:  Home once clinically improved Consults called: Pulmonology  Admission status: Admit - It is my clinical opinion that admission to INPATIENT is reasonable and necessary because this patient will require at least 2 midnights in the hospital to treat this condition based on the medical complexity of the problems presented.  Given the aforementioned information, the predictability of an adverse outcome is felt to be significant.    Karmen Bongo MD [facility]  If note is complete, please contact covering daytime or nighttime physician. [email or web address] Password [facility]  10/22/2017, 8:39 PM

## 2017-10-22 NOTE — Progress Notes (Signed)
Pharmacy Antibiotic Note  Patrick Beck is a 71 y.o. male admitted on 10/22/2017 with pneumonia.  Pharmacy has been consulted for Zosyn dosing.  Plan: Zosyn 3.375g IV q8h (4 hour infusion).  Monitor labs, micro and vitals.   Height: 6\' 1"  (185.4 cm) Weight: 143 lb 7 oz (65.1 kg) IBW/kg (Calculated) : 79.9  Temp (24hrs), Avg:98.2 F (36.8 C), Min:98.2 F (36.8 C), Max:98.2 F (36.8 C)  No results for input(s): WBC, CREATININE, LATICACIDVEN, VANCOTROUGH, VANCOPEAK, VANCORANDOM, GENTTROUGH, GENTPEAK, GENTRANDOM, TOBRATROUGH, TOBRAPEAK, TOBRARND, AMIKACINPEAK, AMIKACINTROU, AMIKACIN in the last 168 hours.  CrCl cannot be calculated (No order found.).    Allergies  Allergen Reactions  . Macadamia Nut Oil     Throat swelling and can't breathe    Antimicrobials this admission: Zosyn 10/24 >>    >>   Dose adjustments this admission: N/a, renal labs pending  Microbiology results: 10/24 BCx: pending  UCx:   10/24 Sputum: pending   MRSA PCR:  10/24 Strep pneumoniae UA: pending  Thank you for allowing pharmacy to be a part of this patient's care.  Pricilla Larsson 10/22/2017 7:42 PM

## 2017-10-23 DIAGNOSIS — E119 Type 2 diabetes mellitus without complications: Secondary | ICD-10-CM

## 2017-10-23 DIAGNOSIS — J189 Pneumonia, unspecified organism: Principal | ICD-10-CM

## 2017-10-23 DIAGNOSIS — I10 Essential (primary) hypertension: Secondary | ICD-10-CM

## 2017-10-23 DIAGNOSIS — H409 Unspecified glaucoma: Secondary | ICD-10-CM

## 2017-10-23 DIAGNOSIS — F17200 Nicotine dependence, unspecified, uncomplicated: Secondary | ICD-10-CM

## 2017-10-23 DIAGNOSIS — J449 Chronic obstructive pulmonary disease, unspecified: Secondary | ICD-10-CM

## 2017-10-23 DIAGNOSIS — J984 Other disorders of lung: Secondary | ICD-10-CM

## 2017-10-23 LAB — CBC WITH DIFFERENTIAL/PLATELET
BASOS ABS: 0 10*3/uL (ref 0.0–0.1)
BASOS PCT: 0 %
Eosinophils Absolute: 0.2 10*3/uL (ref 0.0–0.7)
Eosinophils Relative: 1 %
HEMATOCRIT: 32.4 % — AB (ref 39.0–52.0)
HEMOGLOBIN: 10.4 g/dL — AB (ref 13.0–17.0)
LYMPHS PCT: 13 %
Lymphs Abs: 2 10*3/uL (ref 0.7–4.0)
MCH: 30 pg (ref 26.0–34.0)
MCHC: 32.1 g/dL (ref 30.0–36.0)
MCV: 93.4 fL (ref 78.0–100.0)
Monocytes Absolute: 1.1 10*3/uL — ABNORMAL HIGH (ref 0.1–1.0)
Monocytes Relative: 8 %
NEUTROS ABS: 11.6 10*3/uL — AB (ref 1.7–7.7)
NEUTROS PCT: 78 %
Platelets: 521 10*3/uL — ABNORMAL HIGH (ref 150–400)
RBC: 3.47 MIL/uL — ABNORMAL LOW (ref 4.22–5.81)
RDW: 14 % (ref 11.5–15.5)
WBC: 14.9 10*3/uL — ABNORMAL HIGH (ref 4.0–10.5)

## 2017-10-23 LAB — STREP PNEUMONIAE URINARY ANTIGEN: Strep Pneumo Urinary Antigen: NEGATIVE

## 2017-10-23 LAB — BASIC METABOLIC PANEL
ANION GAP: 8 (ref 5–15)
BUN: 16 mg/dL (ref 6–20)
CALCIUM: 7.6 mg/dL — AB (ref 8.9–10.3)
CO2: 31 mmol/L (ref 22–32)
Chloride: 96 mmol/L — ABNORMAL LOW (ref 101–111)
Creatinine, Ser: 1.01 mg/dL (ref 0.61–1.24)
Glucose, Bld: 66 mg/dL (ref 65–99)
POTASSIUM: 3.9 mmol/L (ref 3.5–5.1)
Sodium: 135 mmol/L (ref 135–145)

## 2017-10-23 LAB — GLUCOSE, CAPILLARY
GLUCOSE-CAPILLARY: 72 mg/dL (ref 65–99)
GLUCOSE-CAPILLARY: 161 mg/dL — AB (ref 65–99)
Glucose-Capillary: 297 mg/dL — ABNORMAL HIGH (ref 65–99)

## 2017-10-23 MED ORDER — TRAZODONE HCL 50 MG PO TABS
50.0000 mg | ORAL_TABLET | Freq: Once | ORAL | Status: AC | PRN
  Administered 2017-10-23: 50 mg via ORAL
  Filled 2017-10-23: qty 1

## 2017-10-23 MED ORDER — NON FORMULARY: 6.0000 mg | Freq: Once | Status: DC | PRN

## 2017-10-23 NOTE — Progress Notes (Signed)
Initial Nutrition Assessment  DOCUMENTATION CODES:      INTERVENTION:   High protein snack q hs   NUTRITION DIAGNOSIS:   Increased nutrient needs   GOAL:  .go Patient will meet greater than or equal to 90% of their needs   MONITOR:   PO intake, Labs, Weight trends  REASON FOR ASSESSMENT:   Malnutrition Screening Tool    ASSESSMENT:  71 yo male with cavitary pneumonia. He has a hx of COPD, DM, HTN, GERD and smokes 2 ppd cigarettes.  His home diet is "healthy". He and spouse say they eat very little processed foods. Meals usually include protein vegetable and starch. His appetite has been very good up until the past week when he has not been eating as much. Expect that chart weight may be erroneous. His weight at the Dr. office yesterday 150 lb. He weighed 166 lbs 2 years ago. His weight has gradually "fallen off". He appears thin and has mild buccal fat depletion and temporal muscle loss.    Recent Labs Lab 10/22/17 1840 10/23/17 0407  NA 134* 135  K 3.7 3.9  CL 96* 96*  CO2 27 31  BUN 18 16  CREATININE 0.99 1.01  CALCIUM 8.1* 7.6*  GLUCOSE 103* 66   Labs and meds reviewed.   Diet Order:  Diet Carb Modified Fluid consistency: Thin; Room service appropriate? Yes  Skin:  Reviewed, no issues  Last BM:  10/24  Height:   Ht Readings from Last 1 Encounters:  10/22/17 6\' 1"  (1.854 m)    Weight:   Wt Readings from Last 1 Encounters:  10/22/17 143 lb 7 oz (65.1 kg)    Ideal Body Weight:  84 kg  BMI:  Body mass index is 18.92 kg/m.  Estimated Nutritional Needs:   Kcal:  1950-2145 (30-33 kcal/kg)  Protein:  78-91 (1.2-1.4 gr/kg)  Fluid:  1.9-2.0 liters daily  EDUCATION NEEDS: encouraged continued heart healthy diet. Based on diet hx pt is compliant with carbohydrate modified diet  Education needs addressed Colman Cater MS,RD,CSG,LDN Office: [phone] Pager: [phone]

## 2017-10-23 NOTE — Consult Note (Signed)
Consult requested by: [facility] Consult requested for: Cavitary pneumonia  HPI: This is a 71 year old who's had an approximately month-long history of chest congestion cough productive of clear sputum. He has been having chills at home. He has not checked his temperature. He has a little bit of chest pain. He is more short of breath than usual and does have COPD. He's not had nausea vomiting or diarrhea. No headache. He has some nasal drainage. His blood sugar has been out of control which is unusual for him.  Past Medical History:  Diagnosis Date  . COPD (chronic obstructive pulmonary disease) (HCC)    not on home O2  . Diabetes mellitus without complication ([place])   . GERD (gastroesophageal reflux disease)   . Glaucoma   . H/O hiatal hernia   . Hypertension      Family History  Problem Relation Age of Onset  . Cancer Neg Hx    Positive history of COPD  Social History   Social History  . Marital status: Divorced    Spouse name: N/A  . Number of children: N/A  . Years of education: N/A   Occupational History  . retired    Social History Main Topics  . Smoking status: Heavy Tobacco Smoker    Packs/day: 2.00    Years: 56.00    Types: Cigarettes  . Smokeless tobacco: Never Used     Comment: pt refuses hand outs   . Alcohol use No  . Drug use: No  . Sexual activity: Not Asked   Other Topics Concern  . None   Social History Narrative  . None     ROS: Except as mentioned 10 point review of systems is negative. No significant weight loss. No hemoptysis    Objective: Vital signs in last 24 hours: Temp:  [98.2 F (36.8 C)-99 F (37.2 C)] 98.9 F (37.2 C) (10/25 0521) Pulse Rate:  [89-99] 89 (10/25 0521) Resp:  [12-20] 18 (10/25 0521) BP: (115-121)/(63-68) 121/68 (10/25 0521) SpO2:  [93 %-96 %] 96 % (10/25 0751) Weight:  [65.1 kg (143 lb 7 oz)] 65.1 kg (143 lb 7 oz) (10/24 1620) Weight change:  Last BM Date: 10/22/17  Intake/Output from previous  day: 10/24 0701 - 10/25 0700 In: 657.5 [P.O.:240; I.V.:367.5; IV Piggyback:50] Out: 800 [Urine:800]  PHYSICAL EXAM Constitutional: He is awake and alert and in no acute distress. Eyes: Pupils react EOMI. Ears nose mouth and throat: His throat is clear. He does not have any sinus tenderness. His hearing is grossly normal. Cardiovascular: His heart is regular with normal heart sounds. No edema. Respiratory: His respiratory effort is normal. His lungs show some rhonchi bilaterally. Gastrointestinal: His abdomen is soft with no masses. Skin: Warm and dry. Musculoskeletal: Normal strength in all extremities. Neurological: No focal abnormalities. Psychiatric: Normal mood and affect  Lab Results: Basic Metabolic Panel:  Recent Labs  10/22/17 1840 10/23/17 0407  NA 134* 135  K 3.7 3.9  CL 96* 96*  CO2 27 31  GLUCOSE 103* 66  BUN 18 16  CREATININE 0.99 1.01  CALCIUM 8.1* 7.6*   Liver Function Tests: No results for input(s): AST, ALT, ALKPHOS, BILITOT, PROT, ALBUMIN in the last 72 hours. No results for input(s): LIPASE, AMYLASE in the last 72 hours. No results for input(s): AMMONIA in the last 72 hours. CBC:  Recent Labs  10/23/17 0407  WBC 14.9*  NEUTROABS 11.6*  HGB 10.4*  HCT 32.4*  MCV 93.4  PLT 521*   Cardiac Enzymes:  No results for input(s): CKTOTAL, CKMB, CKMBINDEX, TROPONINI in the last 72 hours. BNP: No results for input(s): PROBNP in the last 72 hours. D-Dimer: No results for input(s): DDIMER in the last 72 hours. CBG:  Recent Labs  10/22/17 2156  GLUCAP 176*   Hemoglobin A1C: No results for input(s): HGBA1C in the last 72 hours. Fasting Lipid Panel: No results for input(s): CHOL, HDL, LDLCALC, TRIG, CHOLHDL, LDLDIRECT in the last 72 hours. Thyroid Function Tests: No results for input(s): TSH, T4TOTAL, FREET4, T3FREE, THYROIDAB in the last 72 hours. Anemia Panel: No results for input(s): VITAMINB12, FOLATE, FERRITIN, TIBC, IRON, RETICCTPCT in the last 72  hours. Coagulation: No results for input(s): LABPROT, INR in the last 72 hours. Urine Drug Screen: Drugs of Abuse  No results found for: LABOPIA, COCAINSCRNUR, LABBENZ, AMPHETMU, THCU, LABBARB  Alcohol Level: No results for input(s): ETH in the last 72 hours. Urinalysis: No results for input(s): COLORURINE, LABSPEC, PHURINE, GLUCOSEU, HGBUR, BILIRUBINUR, KETONESUR, PROTEINUR, UROBILINOGEN, NITRITE, LEUKOCYTESUR in the last 72 hours.  Invalid input(s): APPERANCEUR Misc. Labs:   ABGS: No results for input(s): PHART, PO2ART, TCO2, HCO3 in the last 72 hours.  Invalid input(s): PCO2   MICROBIOLOGY: Recent Results (from the past 240 hour(s))  Culture, blood (routine x 2) Call MD if unable to obtain prior to antibiotics being given     Status: None (Preliminary result)   Collection Time: 10/22/17  6:42 PM  Result Value Ref Range Status   Specimen Description LEFT ANTECUBITAL  Final   Special Requests   Final    BOTTLES DRAWN AEROBIC AND ANAEROBIC Blood Culture adequate volume   Culture NO GROWTH < 12 HOURS  Final   Report Status PENDING  Incomplete  Culture, blood (routine x 2) Call MD if unable to obtain prior to antibiotics being given     Status: None (Preliminary result)   Collection Time: 10/22/17  6:42 PM  Result Value Ref Range Status   Specimen Description BLOOD LEFT HAND  Final   Special Requests   Final    BOTTLES DRAWN AEROBIC AND ANAEROBIC Blood Culture adequate volume   Culture NO GROWTH < 12 HOURS  Final   Report Status PENDING  Incomplete  Culture, sputum-assessment     Status: None   Collection Time: 10/22/17 10:40 PM  Result Value Ref Range Status   Specimen Description SPUTUM  Final   Special Requests Normal  Final   Sputum evaluation   Final    THIS SPECIMEN IS ACCEPTABLE FOR SPUTUM CULTURE PERFORMED AT [facility]    Report Status 10/22/2017 FINAL  Final    Studies/Results: Ct Chest W Contrast  Result Date: 10/22/2017 CLINICAL DATA:  Abnormal chest  radiograph EXAM: CT CHEST WITH CONTRAST TECHNIQUE: Multidetector CT imaging of the chest was performed during intravenous contrast administration. CONTRAST:  50mL ISOVUE-300 IOPAMIDOL (ISOVUE-300) INJECTION 61% COMPARISON:  10/15/2017 FINDINGS: Cardiovascular: The heart size appears within normal. No pericardial effusion identified. Calcification in the LAD coronary artery identified. No pericardial effusion. Mediastinum/Nodes: Right paratracheal node is enlarged measuring 2.2 cm. There is a left-sided pre-vascular lymph node which measures 1.3 cm, image 73 of series 2. Enlarged left hilar node measures 1.4 cm, image 86 of series 2. No enlarged supraclavicular or axillary adenopathy. The trachea is patent and appears midline. Normal appearance of the esophagus. Lungs/Pleura: Moderate changes of centrilobular paraseptal emphysema. No pleural effusion. There is a large area of masslike consolidation with central cavitation involving the anterior and anteromedial left upper lobe measuring 7.6 x  10.6 by 9.1 cm. Additional patchy areas of ground-glass attenuation scattered throughout the left lung. A second cavitary process is identified within the central aspect of the posteromedial right lower lobe measuring 3.1 x 1.7 by 1.8 cm. A few scattered subcentimeter solid-appearing nodules are identified in both lungs and are nonspecific. Within the medial right upper lobe there is a solid nodule measuring 5 mm, image 39 of series 4. Upper Abdomen: Low-attenuation structure in the left lobe of liver likely represents a small cyst. There is no acute abnormality noted within the upper abdomen. Musculoskeletal: No aggressive lytic or sclerotic bone lesions identified. IMPRESSION: 1. Large cavitary mass within the left upper lobe is identified. In the acute setting, in a patient with signs and symptoms of infection, findings are compatible with necrotizing pneumonia. This would be difficult to distinguish from necrotic tumor and  followup imaging following appropriate antibiotic therapy is advised to ensure resolution. 2. A second smaller area of cavitation is identified within the central portion of the posterior and medial right lower lobe. Again, findings are compatible with either necrotizing pneumonia or tumor and follow-up imaging is advised following appropriate antibiotic therapy to ensure resolution. 3. Enlarged mediastinal and hilar lymph nodes. In the setting of infection these may be reactive in etiology. Cannot exclude metastatic adenopathy and attention to these nodes on follow-up imaging is advised. 4. Aortic Atherosclerosis (ICD10-I70.0) and Emphysema (ICD10-J43.9). LAD coronary artery calcifications noted. These results were called by telephone at the time of interpretation on 10/22/2017 at 9:44 am to Lenon Oms NP who verbally acknowledged these results. Electronically Signed   By: Kerby Moors M.D.   On: 10/22/2017 09:46    Medications:  I have reviewed the patient's current medications. Prior to Admission:  Prescriptions Prior to Admission  Medication Sig Dispense Refill Last Dose  . aspirin 325 MG EC tablet Take 325 mg by mouth daily.   10/21/2017 at 2000  . enalapril (VASOTEC) 10 MG tablet Take 5 mg by mouth 2 (two) times daily.    10/21/2017 at 2000  . glimepiride (AMARYL) 2 MG tablet Take 2 mg by mouth daily before breakfast.   10/22/2017 at 0900  . latanoprost (XALATAN) 0.005 % ophthalmic solution PLACE 1 DROP INTO BOTH EYES AT BEDTIME     . metFORMIN (GLUCOPHAGE) 1000 MG tablet Take 1,000 mg by mouth 2 (two) times daily with a meal.   10/22/2017 at 0900  . pravastatin (PRAVACHOL) 40 MG tablet Take 40 mg by mouth daily.   10/21/2017 at 2000  . timolol (BETIMOL) 0.5 % ophthalmic solution Place 1 drop into both eyes daily.   10/21/2017 at 2000  . ibuprofen (ADVIL,MOTRIN) 200 MG tablet Take 800 mg by mouth every 6 (six) hours as needed for mild pain.   09/17/2015 at Unknown time   Scheduled: .  aspirin EC  325 mg Oral Daily  . enoxaparin (LOVENOX) injection  40 mg Subcutaneous Q24H  . insulin aspart  0-5 Units Subcutaneous QHS  . insulin aspart  0-9 Units Subcutaneous TID WC  . ipratropium-albuterol  3 mL Nebulization Q6H  . latanoprost  1 drop Both Eyes QHS  . pneumococcal 23 valent vaccine  0.5 mL Intramuscular Tomorrow-1000  . pravastatin  40 mg Oral Daily  . timolol  1 drop Both Eyes Daily   Continuous: . sodium chloride 75 mL/hr at 10/22/17 2233  . piperacillin-tazobactam (ZOSYN)  IV Stopped (10/23/17 0741)   KDX:[id number], guaiFENesin-codeine  Assesment: He was admitted with cavitary pneumonia. I agree with  current treatment. He has COPD and a significant smoking history so certainly this could be a cavitary cancer. I discussed that with him and he understands. Principal Problem:   Cavitary pneumonia Active Problems:   COPD (chronic obstructive pulmonary disease) (HCC)   Tobacco dependence   Type 2 diabetes mellitus without complication ([place])   Essential hypertension   Glaucoma    Plan: Continue IV antibiotics    LOS: 1 day   Layah Skousen L 10/23/2017, 9:08 AM

## 2017-10-23 NOTE — Progress Notes (Signed)
Pt stating he wants Melatonin to sleep tonight. Dr. Kennon Holter ordered Melatonin 6mg , but pharmacy states we do not carry melatonin here. Dr. Kennon Holter paged again to request something else for sleep. Waiting for orders/call back.

## 2017-10-23 NOTE — Progress Notes (Signed)

## 2017-10-23 NOTE — Progress Notes (Signed)
PROGRESS NOTE    Patrick Beck  VZC:[id number]  DOB: Oct 01, 1946  DOA: 10/22/2017 PCP: Lemmie Evens, MD  Brief Admission Hx: Patrick Beck is a 71 y.o. male with medical history significant of HTN, Glaucoma, GERD, DM, and COPD (not on home O2) presenting with chest congestion, getting worse and worse and worse.  Symptoms started about a month ago.  He was given Levaquin 2 days ago and took it the last 2 days (none today) without improvement.  He was seen again by Dr. Karie Kirks today after his CT and was sent for direct admission.  MDM/Assessment & Plan:   1. Left upper lobe large cavitary lung mass - suspicious for necrotizing pneumonia - He is being treated for the possible pneumonia, but the differential also includes cancer.  I have asked for an inpatient pulmonary consultation for further evaluation.  Continue IV antibiotics for now.  He is on zosyn which also provides some anaerobic coverage.   2. COPD - Continue duonebs as needed.  No supplemental oxygen has been required.  3. Leukocytosis - suspect secondary to presumed pneumonia, follow CBC with appropriate treatment.  4. Type 2 Diabetes Mellitus - continue sliding scale coverage, follow up on A1c.  5. Chronic active nicotine dependence - continue counseling on tobacco cessation, Pt has been resistant, refusing nicotine patch.  6. Glaucoma - resume home eye drop medications.   DVT prophylaxis: lovenox Code Status: DNR Family Communication:  Disposition Plan: Home    Consultants:  pulmonology  Subjective: Pt says that he is having clear to whitish sputum, it has not been blood tinged, no SOB  Objective: Vitals:   10/22/17 1620 10/22/17 2050 10/22/17 2212 10/23/17 0521  BP: 116/68  115/63 121/68  Pulse: 99  98 89  Resp: 12  20 18   Temp: 98.2 F (36.8 C)  99 F (37.2 C) 98.9 F (37.2 C)  TempSrc: Oral  Oral Oral  SpO2: 96% 95% 93% 95%  Weight: 65.1 kg (143 lb 7 oz)     Height: 6\' 1"  (1.854 m)       Intake/Output  Summary (Last 24 hours) at 10/23/17 1287 Last data filed at 10/23/17 0450  Gross per 24 hour  Intake            657.5 ml  Output              800 ml  Net           -142.5 ml   Filed Weights   10/22/17 1620  Weight: 65.1 kg (143 lb 7 oz)    REVIEW OF SYSTEMS  As per history otherwise all reviewed and reported negative  Exam:  General exam: awake, alert, NAD, thin male. Cooperative.   Respiratory system: fairly clear.  No increased work of breathing. Cardiovascular system: S1 & S2 heard, RRR. No JVD, murmurs, gallops, clicks or pedal edema. Gastrointestinal system: Abdomen is nondistended, soft and nontender. Normal bowel sounds heard. Central nervous system: Alert and oriented. No focal neurological deficits. Extremities: no cyanosis or edema.   Data Reviewed: Basic Metabolic Panel:  Recent Labs Lab 10/22/17 1840 10/23/17 0407  NA 134* 135  K 3.7 3.9  CL 96* 96*  CO2 27 31  GLUCOSE 103* 66  BUN 18 16  CREATININE 0.99 1.01  CALCIUM 8.1* 7.6*   Liver Function Tests: No results for input(s): AST, ALT, ALKPHOS, BILITOT, PROT, ALBUMIN in the last 168 hours. No results for input(s): LIPASE, AMYLASE in the last 168 hours. No results for  input(s): AMMONIA in the last 168 hours. CBC:  Recent Labs Lab 10/23/17 0407  WBC 14.9*  NEUTROABS 11.6*  HGB 10.4*  HCT 32.4*  MCV 93.4  PLT 521*   Cardiac Enzymes: No results for input(s): CKTOTAL, CKMB, CKMBINDEX, TROPONINI in the last 168 hours. CBG (last 3)   Recent Labs  10/22/17 2156  GLUCAP 176*   Recent Results (from the past 240 hour(s))  Culture, blood (routine x 2) Call MD if unable to obtain prior to antibiotics being given     Status: None (Preliminary result)   Collection Time: 10/22/17  6:42 PM  Result Value Ref Range Status   Specimen Description LEFT ANTECUBITAL  Final   Special Requests   Final    BOTTLES DRAWN AEROBIC AND ANAEROBIC Blood Culture adequate volume   Culture PENDING  Incomplete   Report  Status PENDING  Incomplete  Culture, blood (routine x 2) Call MD if unable to obtain prior to antibiotics being given     Status: None (Preliminary result)   Collection Time: 10/22/17  6:42 PM  Result Value Ref Range Status   Specimen Description BLOOD LEFT HAND  Final   Special Requests   Final    BOTTLES DRAWN AEROBIC AND ANAEROBIC Blood Culture adequate volume   Culture PENDING  Incomplete   Report Status PENDING  Incomplete  Culture, sputum-assessment     Status: None   Collection Time: 10/22/17 10:40 PM  Result Value Ref Range Status   Specimen Description SPUTUM  Final   Special Requests Normal  Final   Sputum evaluation   Final    THIS SPECIMEN IS ACCEPTABLE FOR SPUTUM CULTURE PERFORMED AT [facility]    Report Status 10/22/2017 FINAL  Final     Studies: Ct Chest W Contrast  Result Date: 10/22/2017 CLINICAL DATA:  Abnormal chest radiograph EXAM: CT CHEST WITH CONTRAST TECHNIQUE: Multidetector CT imaging of the chest was performed during intravenous contrast administration. CONTRAST:  27mL ISOVUE-300 IOPAMIDOL (ISOVUE-300) INJECTION 61% COMPARISON:  10/15/2017 FINDINGS: Cardiovascular: The heart size appears within normal. No pericardial effusion identified. Calcification in the LAD coronary artery identified. No pericardial effusion. Mediastinum/Nodes: Right paratracheal node is enlarged measuring 2.2 cm. There is a left-sided pre-vascular lymph node which measures 1.3 cm, image 73 of series 2. Enlarged left hilar node measures 1.4 cm, image 86 of series 2. No enlarged supraclavicular or axillary adenopathy. The trachea is patent and appears midline. Normal appearance of the esophagus. Lungs/Pleura: Moderate changes of centrilobular paraseptal emphysema. No pleural effusion. There is a large area of masslike consolidation with central cavitation involving the anterior and anteromedial left upper lobe measuring 7.6 x 10.6 by 9.1 cm. Additional patchy areas of ground-glass attenuation scattered  throughout the left lung. A second cavitary process is identified within the central aspect of the posteromedial right lower lobe measuring 3.1 x 1.7 by 1.8 cm. A few scattered subcentimeter solid-appearing nodules are identified in both lungs and are nonspecific. Within the medial right upper lobe there is a solid nodule measuring 5 mm, image 39 of series 4. Upper Abdomen: Low-attenuation structure in the left lobe of liver likely represents a small cyst. There is no acute abnormality noted within the upper abdomen. Musculoskeletal: No aggressive lytic or sclerotic bone lesions identified. IMPRESSION: 1. Large cavitary mass within the left upper lobe is identified. In the acute setting, in a patient with signs and symptoms of infection, findings are compatible with necrotizing pneumonia. This would be difficult to distinguish from necrotic tumor and  followup imaging following appropriate antibiotic therapy is advised to ensure resolution. 2. A second smaller area of cavitation is identified within the central portion of the posterior and medial right lower lobe. Again, findings are compatible with either necrotizing pneumonia or tumor and follow-up imaging is advised following appropriate antibiotic therapy to ensure resolution. 3. Enlarged mediastinal and hilar lymph nodes. In the setting of infection these may be reactive in etiology. Cannot exclude metastatic adenopathy and attention to these nodes on follow-up imaging is advised. 4. Aortic Atherosclerosis (ICD10-I70.0) and Emphysema (ICD10-J43.9). LAD coronary artery calcifications noted. These results were called by telephone at the time of interpretation on 10/22/2017 at 9:44 am to Lenon Oms NP who verbally acknowledged these results. Electronically Signed   By: Kerby Moors M.D.   On: 10/22/2017 09:46     Scheduled Meds: . aspirin EC  325 mg Oral Daily  . enoxaparin (LOVENOX) injection  40 mg Subcutaneous Q24H  . insulin aspart  0-5 Units  Subcutaneous QHS  . insulin aspart  0-9 Units Subcutaneous TID WC  . ipratropium-albuterol  3 mL Nebulization Q6H  . latanoprost  1 drop Both Eyes QHS  . pneumococcal 23 valent vaccine  0.5 mL Intramuscular Tomorrow-1000  . pravastatin  40 mg Oral Daily  . timolol  1 drop Both Eyes Daily   Continuous Infusions: . sodium chloride 75 mL/hr at 10/22/17 2233  . piperacillin-tazobactam (ZOSYN)  IV 3.375 g (10/23/17 0352)    Principal Problem:   Cavitary pneumonia Active Problems:   COPD (chronic obstructive pulmonary disease) (HCC)   Tobacco dependence   Type 2 diabetes mellitus without complication (HCC)   Essential hypertension   Glaucoma   Time spent:   Irwin Brakeman, MD, FAAFP [facility] Pager [phone]  If 7PM-7AM, please contact night-coverage [email or web address] Password [email or web address] 10/23/2017, 7:28 AM    LOS: 1 day

## 2017-10-24 LAB — COMPREHENSIVE METABOLIC PANEL
ALBUMIN: 2.2 g/dL — AB (ref 3.5–5.0)
ALT: 18 U/L (ref 17–63)
AST: 31 U/L (ref 15–41)
Alkaline Phosphatase: 96 U/L (ref 38–126)
Anion gap: 9 (ref 5–15)
BUN: 9 mg/dL (ref 6–20)
CHLORIDE: 99 mmol/L — AB (ref 101–111)
CO2: 28 mmol/L (ref 22–32)
Calcium: 7.7 mg/dL — ABNORMAL LOW (ref 8.9–10.3)
Creatinine, Ser: 0.83 mg/dL (ref 0.61–1.24)
GFR calc Af Amer: >60 mL/min (ref >60)
GLUCOSE: 154 mg/dL — AB (ref 65–99)
POTASSIUM: 4 mmol/L (ref 3.5–5.1)
SODIUM: 136 mmol/L (ref 135–145)
Total Bilirubin: 0.6 mg/dL (ref 0.3–1.2)
Total Protein: 5.6 g/dL — ABNORMAL LOW (ref 6.5–8.1)

## 2017-10-24 LAB — GLUCOSE, CAPILLARY
Glucose-Capillary: 124 mg/dL — ABNORMAL HIGH (ref 65–99)
Glucose-Capillary: 156 mg/dL — ABNORMAL HIGH (ref 65–99)

## 2017-10-24 LAB — CBC WITH DIFFERENTIAL/PLATELET
Basophils Absolute: 0 10*3/uL (ref 0.0–0.1)
Basophils Relative: 0 %
EOS PCT: 2 %
Eosinophils Absolute: 0.3 10*3/uL (ref 0.0–0.7)
HCT: 34.1 % — ABNORMAL LOW (ref 39.0–52.0)
HEMOGLOBIN: 10.9 g/dL — AB (ref 13.0–17.0)
LYMPHS ABS: 1.9 10*3/uL (ref 0.7–4.0)
LYMPHS PCT: 14 %
MCH: 29.8 pg (ref 26.0–34.0)
MCHC: 32 g/dL (ref 30.0–36.0)
MCV: 93.2 fL (ref 78.0–100.0)
Monocytes Absolute: 1.1 10*3/uL — ABNORMAL HIGH (ref 0.1–1.0)
Monocytes Relative: 8 %
Neutro Abs: 10.9 10*3/uL — ABNORMAL HIGH (ref 1.7–7.7)
Neutrophils Relative %: 76 %
Platelets: 498 10*3/uL — ABNORMAL HIGH (ref 150–400)
RBC: 3.66 MIL/uL — AB (ref 4.22–5.81)
RDW: 13.7 % (ref 11.5–15.5)
WBC: 14.3 10*3/uL — AB (ref 4.0–10.5)

## 2017-10-24 LAB — HEMOGLOBIN A1C
HEMOGLOBIN A1C: 7.3 % — AB (ref 4.8–5.6)
Mean Plasma Glucose: 163 mg/dL

## 2017-10-24 LAB — HIV ANTIBODY (ROUTINE TESTING W REFLEX): HIV Screen 4th Generation wRfx: NONREACTIVE

## 2017-10-24 MED ORDER — GUAIFENESIN-CODEINE 100-10 MG/5ML PO SOLN: 10.0000 mL | ORAL | 0 refills | Status: DC | PRN

## 2017-10-24 MED ORDER — AMOXICILLIN-POT CLAVULANATE 875-125 MG PO TABS: 1.0000 | ORAL_TABLET | Freq: Two times a day (BID) | ORAL | 0 refills | Status: AC

## 2017-10-24 NOTE — Discharge Summary (Signed)
Physician Discharge Summary  Patrick Beck RJJ:[id number] DOB: 1946-10-01 DOA: 10/22/2017  PCP: Lemmie Evens, MD Pulmonologist: Dr. Luan Pulling  Admit date: 10/22/2017 Discharge date: 10/24/2017  Admitted From: Home  Disposition: Home   Recommendations for Outpatient Follow-up:  1. Follow up with PCP in 1 weeks 2. Follow up with pulmonologist in 2-4 weeks 3. Please repeat CXR in about 2 weeks after completing antibiotic treatment to ensure full resolution  4. Please follow up on the following pending results: final culture results 5. Please repeat CBC, CMP on outpatient follow up  Discharge Condition: STABLE   CODE STATUS: DNR   Brief Hospitalization Summary: Please see all hospital notes, images, labs for full details of the hospitalization. FROM HPI: Patrick Beck is a 71 y.o. male with medical history significant of HTN, Glaucoma, GERD, DM, and COPD (not on home O2) presenting with chest congestion, getting worse and worse and worse.  Symptoms started about a month ago. He saw Dr. Karie Kirks when his symptoms started and his BP was 93/63.  He did not receive antibiotics at that time.  He reports that it "feels like a pleurisy", which he has had on and off for years, but this is the worst it's ever been and the longest it's ever lasted.  He usually treats it with Theraflu and Advil with resolution after a couple of days, but this time it is not getting better and actually getting worse.  He ended up with chest x-rays and then a CT.   + Bad night sweats, bad chills occasionally for 45-60 mintues.  ?fever - no thermometer.  Weight loss but also anorexia.  +productive cough - either very thick and clear or very thick and cloudy white.  No blood in his sputum.  No sick contacts.    +SOB with ambulation.  When he walks the dog outside, he has to sit outside for 15 minutes before he can take her back in; this has been getting worse.  He was given Levaquin 2 days ago and took it the last 2 days (none today)  without improvement.  He was seen again by Dr. Karie Kirks today after his CT and was sent for direct admission.  Brief Admission Hx: Patrick Beck a 71 y.o.malewith medical history significant of HTN, Glaucoma, GERD, DM, and COPD (not on home O2) presenting with chest congestion, getting worse and worse and worse. Symptoms started about a month ago.  He was given Levaquin 2 days ago and took it the last 2 days (none today) without improvement. He was seen again by Dr. Karie Kirks today after his CT and was sent for direct admission.  MDM/Assessment & Plan:   1. Left upper lobe large cavitary lung mass - suspicious for necrotizing pneumonia - He is being treated for the possible pneumonia, but the differential also includes cancer.  I have asked for an inpatient pulmonary consultation for further evaluation.  He was treated with IV antibiotics and felt better and asked to go home.  I spoke with Dr. Luan Pulling, he recommended that he take 2 weeks of oral augmentin 875 mg and then have repeat CXR done in about 2 weeks to ensure full resolution.     2. COPD - Continue duonebs as needed.  No supplemental oxygen has been required.  3. Leukocytosis - suspect secondary to presumed pneumonia, follow CBC with appropriate treatment.  4. Type 2 Diabetes Mellitus - continue sliding scale coverage, follow up on A1c.  5. Chronic active nicotine dependence - continue counseling on tobacco  cessation, Pt has been resistant, refusing nicotine patch.  6. Glaucoma - resume home eye drop medications.   DVT prophylaxis: lovenox Code Status: DNR Family Communication:  Disposition Plan: Home   Discharge Diagnoses:  Principal Problem:   Cavitary pneumonia Active Problems:   COPD (chronic obstructive pulmonary disease) (HCC)   Tobacco dependence   Type 2 diabetes mellitus without complication ([place])   Essential hypertension   Glaucoma  Discharge Instructions: Discharge Instructions    Call MD for:  difficulty  breathing, headache or visual disturbances    Complete by:  As directed    Call MD for:  extreme fatigue    Complete by:  As directed    Call MD for:  persistant dizziness or light-headedness    Complete by:  As directed    Call MD for:  persistant nausea and vomiting    Complete by:  As directed    Call MD for:  severe uncontrolled pain    Complete by:  As directed    Increase activity slowly    Complete by:  As directed      Allergies as of 10/24/2017      Reactions   Macadamia Nut Oil    Throat swelling and can't breathe      Medication List    TAKE these medications   amoxicillin-clavulanate 875-125 MG tablet Commonly known as:  AUGMENTIN Take 1 tablet by mouth 2 (two) times daily.   aspirin 325 MG EC tablet Take 325 mg by mouth daily.   enalapril 10 MG tablet Commonly known as:  VASOTEC Take 5 mg by mouth 2 (two) times daily.   glimepiride 2 MG tablet Commonly known as:  AMARYL Take 2 mg by mouth daily before breakfast.   guaiFENesin-codeine 100-10 MG/5ML syrup Take 10 mLs by mouth every 4 (four) hours as needed for cough.   ibuprofen 200 MG tablet Commonly known as:  ADVIL,MOTRIN Take 800 mg by mouth every 6 (six) hours as needed for mild pain.   latanoprost 0.005 % ophthalmic solution Commonly known as:  XALATAN PLACE 1 DROP INTO BOTH EYES AT BEDTIME   metFORMIN 1000 MG tablet Commonly known as:  GLUCOPHAGE Take 1,000 mg by mouth 2 (two) times daily with a meal.   pravastatin 40 MG tablet Commonly known as:  PRAVACHOL Take 40 mg by mouth daily.   timolol 0.5 % ophthalmic solution Commonly known as:  BETIMOL Place 1 drop into both eyes daily.      Follow-up Information    Lemmie Evens, MD. Schedule an appointment as soon as possible for a visit in 1 week(s).   Specialty:  Family Medicine Why:  Hospital Follow Up  Contact information: [place] [address] [phone]        Sinda Du, MD. Schedule an  appointment as soon as possible for a visit in 2 week(s).   Specialty:  Pulmonary Disease Why:  Hospital Follow Up  Contact information: [address] [place] [address] [phone]          Allergies  Allergen Reactions  . Macadamia Nut Oil     Throat swelling and can't breathe   Current Discharge Medication List    START taking these medications   Details  amoxicillin-clavulanate (AUGMENTIN) 875-125 MG tablet Take 1 tablet by mouth 2 (two) times daily. Qty: 28 tablet, Refills: 0    guaiFENesin-codeine 100-10 MG/5ML syrup Take 10 mLs by mouth every 4 (four) hours as needed for  cough. Qty: 120 mL, Refills: 0      CONTINUE these medications which have NOT CHANGED   Details  aspirin 325 MG EC tablet Take 325 mg by mouth daily.    enalapril (VASOTEC) 10 MG tablet Take 5 mg by mouth 2 (two) times daily.     glimepiride (AMARYL) 2 MG tablet Take 2 mg by mouth daily before breakfast.    ibuprofen (ADVIL,MOTRIN) 200 MG tablet Take 800 mg by mouth every 6 (six) hours as needed for mild pain.    latanoprost (XALATAN) 0.005 % ophthalmic solution PLACE 1 DROP INTO BOTH EYES AT BEDTIME    metFORMIN (GLUCOPHAGE) 1000 MG tablet Take 1,000 mg by mouth 2 (two) times daily with a meal.    pravastatin (PRAVACHOL) 40 MG tablet Take 40 mg by mouth daily.    timolol (BETIMOL) 0.5 % ophthalmic solution Place 1 drop into both eyes daily.        Procedures/Studies: Dg Chest 2 View  Result Date: 10/15/2017 CLINICAL DATA:  Abnormal blood finding EXAM: CHEST  2 VIEW COMPARISON:  None. FINDINGS: COPD with pulmonary hyperinflation. Patchy airspace disease left upper lobe anteriorly. This could be due to infection or tumor. Negative for heart failure. Right lung clear. No effusion. Atherosclerotic aorta. No acute skeletal abnormality. IMPRESSION: COPD with patchy left upper lobe airspace disease. Possible pneumonia however tumor cannot be excluded. If the patient has  symptoms of acute pneumonia, close follow-up radiographs can be performed. Otherwise, CT chest with contrast at this time is recommended to rule out neoplasm. Electronically Signed   By: Franchot Gallo M.D.   On: 10/15/2017 15:36   Ct Chest W Contrast  Result Date: 10/22/2017 CLINICAL DATA:  Abnormal chest radiograph EXAM: CT CHEST WITH CONTRAST TECHNIQUE: Multidetector CT imaging of the chest was performed during intravenous contrast administration. CONTRAST:  93mL ISOVUE-300 IOPAMIDOL (ISOVUE-300) INJECTION 61% COMPARISON:  10/15/2017 FINDINGS: Cardiovascular: The heart size appears within normal. No pericardial effusion identified. Calcification in the LAD coronary artery identified. No pericardial effusion. Mediastinum/Nodes: Right paratracheal node is enlarged measuring 2.2 cm. There is a left-sided pre-vascular lymph node which measures 1.3 cm, image 73 of series 2. Enlarged left hilar node measures 1.4 cm, image 86 of series 2. No enlarged supraclavicular or axillary adenopathy. The trachea is patent and appears midline. Normal appearance of the esophagus. Lungs/Pleura: Moderate changes of centrilobular paraseptal emphysema. No pleural effusion. There is a large area of masslike consolidation with central cavitation involving the anterior and anteromedial left upper lobe measuring 7.6 x 10.6 by 9.1 cm. Additional patchy areas of ground-glass attenuation scattered throughout the left lung. A second cavitary process is identified within the central aspect of the posteromedial right lower lobe measuring 3.1 x 1.7 by 1.8 cm. A few scattered subcentimeter solid-appearing nodules are identified in both lungs and are nonspecific. Within the medial right upper lobe there is a solid nodule measuring 5 mm, image 39 of series 4. Upper Abdomen: Low-attenuation structure in the left lobe of liver likely represents a small cyst. There is no acute abnormality noted within the upper abdomen. Musculoskeletal: No  aggressive lytic or sclerotic bone lesions identified. IMPRESSION: 1. Large cavitary mass within the left upper lobe is identified. In the acute setting, in a patient with signs and symptoms of infection, findings are compatible with necrotizing pneumonia. This would be difficult to distinguish from necrotic tumor and followup imaging following appropriate antibiotic therapy is advised to ensure resolution. 2. A second smaller area of cavitation is  identified within the central portion of the posterior and medial right lower lobe. Again, findings are compatible with either necrotizing pneumonia or tumor and follow-up imaging is advised following appropriate antibiotic therapy to ensure resolution. 3. Enlarged mediastinal and hilar lymph nodes. In the setting of infection these may be reactive in etiology. Cannot exclude metastatic adenopathy and attention to these nodes on follow-up imaging is advised. 4. Aortic Atherosclerosis (ICD10-I70.0) and Emphysema (ICD10-J43.9). LAD coronary artery calcifications noted. These results were called by telephone at the time of interpretation on 10/22/2017 at 9:44 am to Lenon Oms NP who verbally acknowledged these results. Electronically Signed   By: Kerby Moors M.D.   On: 10/22/2017 09:46      Subjective: Pt says he feels better and asking about going home.    Discharge Exam: Vitals:   10/23/17 2139 10/24/17 0524  BP: 129/65 (!) 148/88  Pulse: 95 84  Resp: 18 18  Temp: 98.7 F (37.1 C) 98.8 F (37.1 C)  SpO2: 96% 96%   Vitals:   10/23/17 0751 10/23/17 1343 10/23/17 2139 10/24/17 0524  BP:  134/72 129/65 (!) 148/88  Pulse:  94 95 84  Resp:  18 18 18   Temp:  98.6 F (37 C) 98.7 F (37.1 C) 98.8 F (37.1 C)  TempSrc:  Oral Oral Oral  SpO2: 96% 95% 96% 96%  Weight:      Height:       General exam: awake, alert, NAD, thin male. Cooperative.   Respiratory system: clear.  No increased work of breathing. Cardiovascular system: S1 & S2 heard,  RRR. No JVD, murmurs, gallops, clicks or pedal edema. Gastrointestinal system: Abdomen is nondistended, soft and nontender. Normal bowel sounds heard. Central nervous system: Alert and oriented. No focal neurological deficits. Extremities: no cyanosis or edema.    The results of significant diagnostics from this hospitalization (including imaging, microbiology, ancillary and laboratory) are listed below for reference.     Microbiology: Recent Results (from the past 240 hour(s))  Culture, blood (routine x 2) Call MD if unable to obtain prior to antibiotics being given     Status: None (Preliminary result)   Collection Time: 10/22/17  6:42 PM  Result Value Ref Range Status   Specimen Description LEFT ANTECUBITAL  Final   Special Requests   Final    BOTTLES DRAWN AEROBIC AND ANAEROBIC Blood Culture adequate volume   Culture NO GROWTH 2 DAYS  Final   Report Status PENDING  Incomplete  Culture, blood (routine x 2) Call MD if unable to obtain prior to antibiotics being given     Status: None (Preliminary result)   Collection Time: 10/22/17  6:42 PM  Result Value Ref Range Status   Specimen Description BLOOD LEFT HAND  Final   Special Requests   Final    BOTTLES DRAWN AEROBIC AND ANAEROBIC Blood Culture adequate volume   Culture NO GROWTH 2 DAYS  Final   Report Status PENDING  Incomplete  Culture, sputum-assessment     Status: None   Collection Time: 10/22/17 10:40 PM  Result Value Ref Range Status   Specimen Description SPUTUM  Final   Special Requests Normal  Final   Sputum evaluation   Final    THIS SPECIMEN IS ACCEPTABLE FOR SPUTUM CULTURE PERFORMED AT [facility]    Report Status 10/22/2017 FINAL  Final  Culture, respiratory (NON-Expectorated)     Status: None (Preliminary result)   Collection Time: 10/22/17 10:40 PM  Result Value Ref Range Status   Specimen  Description SPUTUM  Final   Special Requests Normal Reflexed from [id number]  Final   Gram Stain   Final    RARE WBC PRESENT,BOTH  PMN AND MONONUCLEAR FEW SQUAMOUS EPITHELIAL CELLS PRESENT FEW GRAM POSITIVE COCCI IN PAIRS RARE GRAM VARIABLE ROD Performed at [facility], [place] [address] [place], [place] [address]    Culture PENDING  Incomplete   Report Status PENDING  Incomplete     Labs: BNP (last 3 results) No results for input(s): BNP in the last 8760 hours. Basic Metabolic Panel:  Recent Labs Lab 10/22/17 1840 10/23/17 0407 10/24/17 0450  NA 134* 135 136  K 3.7 3.9 4.0  CL 96* 96* 99*  CO2 27 31 28   GLUCOSE 103* 66 154*  BUN 18 16 9   CREATININE 0.99 1.01 0.83  CALCIUM 8.1* 7.6* 7.7*   Liver Function Tests:  Recent Labs Lab 10/24/17 0450  AST 31  ALT 18  ALKPHOS 96  BILITOT 0.6  PROT 5.6*  ALBUMIN 2.2*   No results for input(s): LIPASE, AMYLASE in the last 168 hours. No results for input(s): AMMONIA in the last 168 hours. CBC:  Recent Labs Lab 10/23/17 0407 10/24/17 0450  WBC 14.9* 14.3*  NEUTROABS 11.6* 10.9*  HGB 10.4* 10.9*  HCT 32.4* 34.1*  MCV 93.4 93.2  PLT 521* 498*   Cardiac Enzymes: No results for input(s): CKTOTAL, CKMB, CKMBINDEX, TROPONINI in the last 168 hours. BNP: Invalid input(s): POCBNP CBG:  Recent Labs Lab 10/22/17 2156 10/23/17 0732 10/23/17 1115 10/23/17 1607  GLUCAP 176* 72 297* 161*   D-Dimer No results for input(s): DDIMER in the last 72 hours. Hgb A1c  Recent Labs  10/22/17 1842  HGBA1C 7.3*   Lipid Profile No results for input(s): CHOL, HDL, LDLCALC, TRIG, CHOLHDL, LDLDIRECT in the last 72 hours. Thyroid function studies No results for input(s): TSH, T4TOTAL, T3FREE, THYROIDAB in the last 72 hours.  Invalid input(s): FREET3 Anemia work up No results for input(s): VITAMINB12, FOLATE, FERRITIN, TIBC, IRON, RETICCTPCT in the last 72 hours. Urinalysis No results found for: COLORURINE, APPEARANCEUR, [place], [place], [place], [place], [place], [place], PROTEINUR, UROBILINOGEN, NITRITE, LEUKOCYTESUR Sepsis Labs Invalid  input(s): PROCALCITONIN,  WBC,  LACTICIDVEN Microbiology Recent Results (from the past 240 hour(s))  Culture, blood (routine x 2) Call MD if unable to obtain prior to antibiotics being given     Status: None (Preliminary result)   Collection Time: 10/22/17  6:42 PM  Result Value Ref Range Status   Specimen Description LEFT ANTECUBITAL  Final   Special Requests   Final    BOTTLES DRAWN AEROBIC AND ANAEROBIC Blood Culture adequate volume   Culture NO GROWTH 2 DAYS  Final   Report Status PENDING  Incomplete  Culture, blood (routine x 2) Call MD if unable to obtain prior to antibiotics being given     Status: None (Preliminary result)   Collection Time: 10/22/17  6:42 PM  Result Value Ref Range Status   Specimen Description BLOOD LEFT HAND  Final   Special Requests   Final    BOTTLES DRAWN AEROBIC AND ANAEROBIC Blood Culture adequate volume   Culture NO GROWTH 2 DAYS  Final   Report Status PENDING  Incomplete  Culture, sputum-assessment     Status: None   Collection Time: 10/22/17 10:40 PM  Result Value Ref Range Status   Specimen Description SPUTUM  Final   Special Requests Normal  Final   Sputum evaluation   Final    THIS SPECIMEN IS  ACCEPTABLE FOR SPUTUM CULTURE PERFORMED AT [facility]    Report Status 10/22/2017 FINAL  Final  Culture, respiratory (NON-Expectorated)     Status: None (Preliminary result)   Collection Time: 10/22/17 10:40 PM  Result Value Ref Range Status   Specimen Description SPUTUM  Final   Special Requests Normal Reflexed from [id number]  Final   Gram Stain   Final    RARE WBC PRESENT,BOTH PMN AND MONONUCLEAR FEW SQUAMOUS EPITHELIAL CELLS PRESENT FEW GRAM POSITIVE COCCI IN PAIRS RARE GRAM VARIABLE ROD Performed at [facility], [place] [address], [place], [place] [address]    Culture PENDING  Incomplete   Report Status PENDING  Incomplete    Time coordinating discharge: 32 mins  SIGNED:  Irwin Brakeman, MD  [facility] 10/24/2017, 9:08 AM Pager  [phone]  If 7PM-7AM, please contact night-coverage [email or web address] Password [email or web address]

## 2017-10-24 NOTE — Care Management Important Message (Signed)
Important Message  Patient Details  Name: Taino Maertens MRN: [id number] Date of Birth: 11/07/1946   Medicare Important Message Given:       Nickola Major, RN 10/24/2017, 9:55 AM

## 2017-10-24 NOTE — Progress Notes (Signed)
Subjective: He says he feels much better and wants to go home. He has no new complaints. He wondered about having a repeat CT but I think it's much too early for any significant change and I explained that to him.  Objective: Vital signs in last 24 hours: Temp:  [98.6 F (37 C)-98.8 F (37.1 C)] 98.8 F (37.1 C) (10/26 0524) Pulse Rate:  [84-95] 84 (10/26 0524) Resp:  [18] 18 (10/26 0524) BP: (129-148)/(65-88) 148/88 (10/26 0524) SpO2:  [95 %-96 %] 96 % (10/26 0524) Weight change:  Last BM Date: 10/23/17  Intake/Output from previous day: 10/25 0701 - 10/26 0700 In: 2337.5 [P.O.:840; I.V.:1347.5; IV Piggyback:150] Out: 1950 [Urine:1950]  PHYSICAL EXAM General appearance: alert, cooperative and no distress Resp: rhonchi bilaterally Cardio: regular rate and rhythm, S1, S2 normal, no murmur, click, rub or gallop GI: soft, non-tender; bowel sounds normal; no masses,  no organomegaly Extremities: extremities normal, atraumatic, no cyanosis or edema Skin turgor good  Lab Results:  Results for orders placed or performed during the hospital encounter of 10/22/17 (from the past 48 hour(s))  Basic metabolic panel     Status: Abnormal   Collection Time: 10/22/17  6:40 PM  Result Value Ref Range   Sodium 134 (L) 135 - 145 mmol/L   Potassium 3.7 3.5 - 5.1 mmol/L   Chloride 96 (L) 101 - 111 mmol/L   CO2 27 22 - 32 mmol/L   Glucose, Bld 103 (H) 65 - 99 mg/dL   BUN 18 6 - 20 mg/dL   Creatinine, Ser 0.99 0.61 - 1.24 mg/dL   Calcium 8.1 (L) 8.9 - 10.3 mg/dL   GFR calc non Af Amer >60 >60 mL/min   GFR calc Af Amer >60 >60 mL/min    Comment: (NOTE) The eGFR has been calculated using the CKD EPI equation. This calculation has not been validated in all clinical situations. eGFR's persistently <60 mL/min signify possible Chronic Kidney Disease.    Anion gap 11 5 - 15  HIV antibody     Status: None   Collection Time: 10/22/17  6:42 PM  Result Value Ref Range   HIV Screen 4th Generation  wRfx Non Reactive Non Reactive    Comment: (NOTE) Performed At: [facility] [address] [place], [place] [id number] Lindon Romp MD WU:[id number]   Culture, blood (routine x 2) Call MD if unable to obtain prior to antibiotics being given     Status: None (Preliminary result)   Collection Time: 10/22/17  6:42 PM  Result Value Ref Range   Specimen Description LEFT ANTECUBITAL    Special Requests      BOTTLES DRAWN AEROBIC AND ANAEROBIC Blood Culture adequate volume   Culture NO GROWTH 2 DAYS    Report Status PENDING   Culture, blood (routine x 2) Call MD if unable to obtain prior to antibiotics being given     Status: None (Preliminary result)   Collection Time: 10/22/17  6:42 PM  Result Value Ref Range   Specimen Description BLOOD LEFT HAND    Special Requests      BOTTLES DRAWN AEROBIC AND ANAEROBIC Blood Culture adequate volume   Culture NO GROWTH 2 DAYS    Report Status PENDING   Hemoglobin A1c     Status: Abnormal   Collection Time: 10/22/17  6:42 PM  Result Value Ref Range   Hgb A1c MFr Bld 7.3 (H) 4.8 - 5.6 %    Comment: (NOTE)  Prediabetes: 5.7 - 6.4         Diabetes: >6.4         Glycemic control for adults with diabetes: <7.0    Mean Plasma Glucose 163 mg/dL    Comment: (NOTE) Performed At: [facility] [place], [place] [id number] Lindon Romp MD [id number]   Glucose, capillary     Status: Abnormal   Collection Time: 10/22/17  9:56 PM  Result Value Ref Range   Glucose-Capillary 176 (H) 65 - 99 mg/dL  Culture, sputum-assessment     Status: None   Collection Time: 10/22/17 10:40 PM  Result Value Ref Range   Specimen Description SPUTUM    Special Requests Normal    Sputum evaluation      THIS SPECIMEN IS ACCEPTABLE FOR SPUTUM CULTURE PERFORMED AT [facility]    Report Status 10/22/2017 FINAL   Culture, respiratory (NON-Expectorated)     Status: None (Preliminary result)   Collection Time: 10/22/17 10:40 PM  Result  Value Ref Range   Specimen Description SPUTUM    Special Requests Normal Reflexed from [id number]    Gram Stain      RARE WBC PRESENT,BOTH PMN AND MONONUCLEAR FEW SQUAMOUS EPITHELIAL CELLS PRESENT FEW GRAM POSITIVE COCCI IN PAIRS RARE GRAM VARIABLE ROD Performed at [facility], [place] [address] [place], [place] [address]    Culture PENDING    Report Status PENDING   Strep pneumoniae urinary antigen     Status: None   Collection Time: 10/23/17 12:00 AM  Result Value Ref Range   Strep Pneumo Urinary Antigen NEGATIVE NEGATIVE    Comment:        Infection due to S. pneumoniae cannot be absolutely ruled out since the antigen present may be below the detection limit of the test. Performed at [facility], [place] [address], [place], [place] [address]   Basic metabolic panel     Status: Abnormal   Collection Time: 10/23/17  4:07 AM  Result Value Ref Range   Sodium 135 135 - 145 mmol/L   Potassium 3.9 3.5 - 5.1 mmol/L   Chloride 96 (L) 101 - 111 mmol/L   CO2 31 22 - 32 mmol/L   Glucose, Bld 66 65 - 99 mg/dL   BUN 16 6 - 20 mg/dL   Creatinine, Ser 1.01 0.61 - 1.24 mg/dL   Calcium 7.6 (L) 8.9 - 10.3 mg/dL   GFR calc non Af Amer >60 >60 mL/min   GFR calc Af Amer >60 >60 mL/min    Comment: (NOTE) The eGFR has been calculated using the CKD EPI equation. This calculation has not been validated in all clinical situations. eGFR's persistently <60 mL/min signify possible Chronic Kidney Disease.    Anion gap 8 5 - 15  CBC WITH DIFFERENTIAL     Status: Abnormal   Collection Time: 10/23/17  4:07 AM  Result Value Ref Range   WBC 14.9 (H) 4.0 - 10.5 K/uL   RBC 3.47 (L) 4.22 - 5.81 MIL/uL   Hemoglobin 10.4 (L) 13.0 - 17.0 g/dL   HCT 32.4 (L) 39.0 - 52.0 %   MCV 93.4 78.0 - 100.0 fL   MCH 30.0 26.0 - 34.0 pg   MCHC 32.1 30.0 - 36.0 g/dL   RDW 14.0 11.5 - 15.5 %   Platelets 521 (H) 150 - 400 K/uL   Neutrophils Relative % 78 %   Neutro Abs 11.6 (H) 1.7 - 7.7 K/uL   Lymphocytes  Relative 13 %  Lymphs Abs 2.0 0.7 - 4.0 K/uL   Monocytes Relative 8 %   Monocytes Absolute 1.1 (H) 0.1 - 1.0 K/uL   Eosinophils Relative 1 %   Eosinophils Absolute 0.2 0.0 - 0.7 K/uL   Basophils Relative 0 %   Basophils Absolute 0.0 0.0 - 0.1 K/uL  Glucose, capillary     Status: None   Collection Time: 10/23/17  7:32 AM  Result Value Ref Range   Glucose-Capillary 72 65 - 99 mg/dL  Glucose, capillary     Status: Abnormal   Collection Time: 10/23/17 11:15 AM  Result Value Ref Range   Glucose-Capillary 297 (H) 65 - 99 mg/dL  Glucose, capillary     Status: Abnormal   Collection Time: 10/23/17  4:07 PM  Result Value Ref Range   Glucose-Capillary 161 (H) 65 - 99 mg/dL  Comprehensive metabolic panel     Status: Abnormal   Collection Time: 10/24/17  4:50 AM  Result Value Ref Range   Sodium 136 135 - 145 mmol/L   Potassium 4.0 3.5 - 5.1 mmol/L   Chloride 99 (L) 101 - 111 mmol/L   CO2 28 22 - 32 mmol/L   Glucose, Bld 154 (H) 65 - 99 mg/dL   BUN 9 6 - 20 mg/dL   Creatinine, Ser 0.83 0.61 - 1.24 mg/dL   Calcium 7.7 (L) 8.9 - 10.3 mg/dL   Total Protein 5.6 (L) 6.5 - 8.1 g/dL   Albumin 2.2 (L) 3.5 - 5.0 g/dL   AST 31 15 - 41 U/L   ALT 18 17 - 63 U/L   Alkaline Phosphatase 96 38 - 126 U/L   Total Bilirubin 0.6 0.3 - 1.2 mg/dL   GFR calc non Af Amer >60 >60 mL/min   GFR calc Af Amer >60 >60 mL/min    Comment: (NOTE) The eGFR has been calculated using the CKD EPI equation. This calculation has not been validated in all clinical situations. eGFR's persistently <60 mL/min signify possible Chronic Kidney Disease.    Anion gap 9 5 - 15  CBC with Differential/Platelet     Status: Abnormal   Collection Time: 10/24/17  4:50 AM  Result Value Ref Range   WBC 14.3 (H) 4.0 - 10.5 K/uL   RBC 3.66 (L) 4.22 - 5.81 MIL/uL   Hemoglobin 10.9 (L) 13.0 - 17.0 g/dL   HCT 34.1 (L) 39.0 - 52.0 %   MCV 93.2 78.0 - 100.0 fL   MCH 29.8 26.0 - 34.0 pg   MCHC 32.0 30.0 - 36.0 g/dL   RDW 13.7 11.5 - 15.5  %   Platelets 498 (H) 150 - 400 K/uL   Neutrophils Relative % 76 %   Neutro Abs 10.9 (H) 1.7 - 7.7 K/uL   Lymphocytes Relative 14 %   Lymphs Abs 1.9 0.7 - 4.0 K/uL   Monocytes Relative 8 %   Monocytes Absolute 1.1 (H) 0.1 - 1.0 K/uL   Eosinophils Relative 2 %   Eosinophils Absolute 0.3 0.0 - 0.7 K/uL   Basophils Relative 0 %   Basophils Absolute 0.0 0.0 - 0.1 K/uL    ABGS No results for input(s): PHART, PO2ART, TCO2, HCO3 in the last 72 hours.  Invalid input(s): PCO2 CULTURES Recent Results (from the past 240 hour(s))  Culture, blood (routine x 2) Call MD if unable to obtain prior to antibiotics being given     Status: None (Preliminary result)   Collection Time: 10/22/17  6:42 PM  Result Value Ref Range Status  Specimen Description LEFT ANTECUBITAL  Final   Special Requests   Final    BOTTLES DRAWN AEROBIC AND ANAEROBIC Blood Culture adequate volume   Culture NO GROWTH 2 DAYS  Final   Report Status PENDING  Incomplete  Culture, blood (routine x 2) Call MD if unable to obtain prior to antibiotics being given     Status: None (Preliminary result)   Collection Time: 10/22/17  6:42 PM  Result Value Ref Range Status   Specimen Description BLOOD LEFT HAND  Final   Special Requests   Final    BOTTLES DRAWN AEROBIC AND ANAEROBIC Blood Culture adequate volume   Culture NO GROWTH 2 DAYS  Final   Report Status PENDING  Incomplete  Culture, sputum-assessment     Status: None   Collection Time: 10/22/17 10:40 PM  Result Value Ref Range Status   Specimen Description SPUTUM  Final   Special Requests Normal  Final   Sputum evaluation   Final    THIS SPECIMEN IS ACCEPTABLE FOR SPUTUM CULTURE PERFORMED AT [facility]    Report Status 10/22/2017 FINAL  Final  Culture, respiratory (NON-Expectorated)     Status: None (Preliminary result)   Collection Time: 10/22/17 10:40 PM  Result Value Ref Range Status   Specimen Description SPUTUM  Final   Special Requests Normal Reflexed from [id number]  Final    Gram Stain   Final    RARE WBC PRESENT,BOTH PMN AND MONONUCLEAR FEW SQUAMOUS EPITHELIAL CELLS PRESENT FEW GRAM POSITIVE COCCI IN PAIRS RARE GRAM VARIABLE ROD Performed at [facility], [place] [address], [place], [place] [address]    Culture PENDING  Incomplete   Report Status PENDING  Incomplete   Studies/Results: Ct Chest W Contrast  Result Date: 10/22/2017 CLINICAL DATA:  Abnormal chest radiograph EXAM: CT CHEST WITH CONTRAST TECHNIQUE: Multidetector CT imaging of the chest was performed during intravenous contrast administration. CONTRAST:  40m ISOVUE-300 IOPAMIDOL (ISOVUE-300) INJECTION 61% COMPARISON:  10/15/2017 FINDINGS: Cardiovascular: The heart size appears within normal. No pericardial effusion identified. Calcification in the LAD coronary artery identified. No pericardial effusion. Mediastinum/Nodes: Right paratracheal node is enlarged measuring 2.2 cm. There is a left-sided pre-vascular lymph node which measures 1.3 cm, image 73 of series 2. Enlarged left hilar node measures 1.4 cm, image 86 of series 2. No enlarged supraclavicular or axillary adenopathy. The trachea is patent and appears midline. Normal appearance of the esophagus. Lungs/Pleura: Moderate changes of centrilobular paraseptal emphysema. No pleural effusion. There is a large area of masslike consolidation with central cavitation involving the anterior and anteromedial left upper lobe measuring 7.6 x 10.6 by 9.1 cm. Additional patchy areas of ground-glass attenuation scattered throughout the left lung. A second cavitary process is identified within the central aspect of the posteromedial right lower lobe measuring 3.1 x 1.7 by 1.8 cm. A few scattered subcentimeter solid-appearing nodules are identified in both lungs and are nonspecific. Within the medial right upper lobe there is a solid nodule measuring 5 mm, image 39 of series 4. Upper Abdomen: Low-attenuation structure in the left lobe of liver likely represents a  small cyst. There is no acute abnormality noted within the upper abdomen. Musculoskeletal: No aggressive lytic or sclerotic bone lesions identified. IMPRESSION: 1. Large cavitary mass within the left upper lobe is identified. In the acute setting, in a patient with signs and symptoms of infection, findings are compatible with necrotizing pneumonia. This would be difficult to distinguish from necrotic tumor and followup imaging following appropriate antibiotic therapy is advised  to ensure resolution. 2. A second smaller area of cavitation is identified within the central portion of the posterior and medial right lower lobe. Again, findings are compatible with either necrotizing pneumonia or tumor and follow-up imaging is advised following appropriate antibiotic therapy to ensure resolution. 3. Enlarged mediastinal and hilar lymph nodes. In the setting of infection these may be reactive in etiology. Cannot exclude metastatic adenopathy and attention to these nodes on follow-up imaging is advised. 4. Aortic Atherosclerosis (ICD10-I70.0) and Emphysema (ICD10-J43.9). LAD coronary artery calcifications noted. These results were called by telephone at the time of interpretation on 10/22/2017 at 9:44 am to Lenon Oms NP who verbally acknowledged these results. Electronically Signed   By: Kerby Moors M.D.   On: 10/22/2017 09:46    Medications:  Prior to Admission:  Prescriptions Prior to Admission  Medication Sig Dispense Refill Last Dose  . aspirin 325 MG EC tablet Take 325 mg by mouth daily.   10/21/2017 at 2000  . enalapril (VASOTEC) 10 MG tablet Take 5 mg by mouth 2 (two) times daily.    10/21/2017 at 2000  . glimepiride (AMARYL) 2 MG tablet Take 2 mg by mouth daily before breakfast.   10/22/2017 at 0900  . ibuprofen (ADVIL,MOTRIN) 200 MG tablet Take 800 mg by mouth every 6 (six) hours as needed for mild pain.   unknown  . latanoprost (XALATAN) 0.005 % ophthalmic solution PLACE 1 DROP INTO BOTH EYES  AT BEDTIME   Past Week at Unknown time  . metFORMIN (GLUCOPHAGE) 1000 MG tablet Take 1,000 mg by mouth 2 (two) times daily with a meal.   10/22/2017 at 0900  . pravastatin (PRAVACHOL) 40 MG tablet Take 40 mg by mouth daily.   10/21/2017 at 2000  . timolol (BETIMOL) 0.5 % ophthalmic solution Place 1 drop into both eyes daily.   10/21/2017 at 2000   Scheduled: . aspirin EC  325 mg Oral Daily  . enoxaparin (LOVENOX) injection  40 mg Subcutaneous Q24H  . insulin aspart  0-5 Units Subcutaneous QHS  . insulin aspart  0-9 Units Subcutaneous TID WC  . latanoprost  1 drop Both Eyes QHS  . pravastatin  40 mg Oral Daily   Continuous: . sodium chloride 50 mL/hr at 10/24/17 0347  . piperacillin-tazobactam (ZOSYN)  IV Stopped (10/24/17 0747)   NLG:XQJJHERDE, guaiFENesin-codeine  Assesment:He has cavitary pneumonia and he looks much better. He wants to go home. If he goes home I would send him out on Augmentin 875 mg for at least 2 weeks. Principal Problem:   Cavitary pneumonia Active Problems:   COPD (chronic obstructive pulmonary disease) (HCC)   Tobacco dependence   Type 2 diabetes mellitus without complication ([place])   Essential hypertension   Glaucoma    Plan: As above. He did have chest x-ray in about 2 weeks to see if things seem to be getting better but this may take 6 weeks or more go totally away.    LOS: 2 days   Bearl Talarico L 10/24/2017, 8:50 AM

## 2017-10-24 NOTE — Discharge Instructions (Signed)
Follow with Primary MD  Lemmie Evens, MD  and other consultant's as instructed your Hospitalist MD  Please get a complete blood count and chemistry panel checked by your Primary MD at your next visit, and again as instructed by your Primary MD.  Get Medicines reviewed and adjusted: Please take all your medications with you for your next visit with your Primary MD  Laboratory/radiological data: Please request your Primary MD to go over all hospital tests and procedure/radiological results at the follow up, please ask your Primary MD to get all Hospital records sent to his/her office.  In some cases, they will be blood work, cultures and biopsy results pending at the time of your discharge. Please request that your primary care M.D. follows up on these results.  Also Note the following: If you experience worsening of your admission symptoms, develop shortness of breath, life threatening emergency, suicidal or homicidal thoughts you must seek medical attention immediately by calling 911 or calling your MD immediately  if symptoms less severe.  You must read complete instructions/literature along with all the possible adverse reactions/side effects for all the Medicines you take and that have been prescribed to you. Take any new Medicines after you have completely understood and accpet all the possible adverse reactions/side effects.   Do not drive when taking Pain medications or sleeping medications (Benzodaizepines)  Do not take more than prescribed Pain, Sleep and Anxiety Medications. It is not advisable to combine anxiety,sleep and pain medications without talking with your primary care practitioner  Special Instructions: If you have smoked or chewed Tobacco  in the last 2 yrs please stop smoking, stop any regular Alcohol  and or any Recreational drug use.  Wear Seat belts while driving.  Please note: You were cared for by a hospitalist during your hospital stay. Once you are discharged,  your primary care physician will handle any further medical issues. Please note that NO REFILLS for any discharge medications will be authorized once you are discharged, as it is imperative that you return to your primary care physician (or establish a relationship with a primary care physician if you do not have one) for your post hospital discharge needs so that they can reassess your need for medications and monitor your lab values.

## 2017-10-24 NOTE — Progress Notes (Signed)
IV removed, WNL. D/C instructions given to pt. Verbalized understanding. Pt family member at bedside to transport home.  

## 2017-10-25 LAB — CULTURE, RESPIRATORY W GRAM STAIN: Culture: NORMAL

## 2017-10-25 LAB — CULTURE, RESPIRATORY: SPECIAL REQUESTS: NORMAL

## 2017-10-27 LAB — CULTURE, BLOOD (ROUTINE X 2)
CULTURE: NO GROWTH
CULTURE: NO GROWTH
SPECIAL REQUESTS: ADEQUATE
Special Requests: ADEQUATE

## 2017-11-11 ENCOUNTER — Other Ambulatory Visit (HOSPITAL_COMMUNITY): Payer: Self-pay | Admitting: Pulmonary Disease

## 2017-11-11 ENCOUNTER — Ambulatory Visit (HOSPITAL_COMMUNITY)
Admission: RE | Admit: 2017-11-11 | Discharge: 2017-11-11 | Disposition: A | Discharge status: Home / Self Care | Payer: Medicare Other | Source: Ambulatory Visit | Attending: Pulmonary Disease | Admitting: Pulmonary Disease

## 2017-11-11 DIAGNOSIS — R918 Other nonspecific abnormal finding of lung field: Secondary | ICD-10-CM | POA: Diagnosis not present

## 2017-11-11 DIAGNOSIS — J189 Pneumonia, unspecified organism: Secondary | ICD-10-CM

## 2018-01-06 ENCOUNTER — Other Ambulatory Visit (HOSPITAL_COMMUNITY): Payer: Self-pay | Admitting: Family Medicine

## 2018-01-06 DIAGNOSIS — J449 Chronic obstructive pulmonary disease, unspecified: Secondary | ICD-10-CM

## 2018-01-06 DIAGNOSIS — J189 Pneumonia, unspecified organism: Secondary | ICD-10-CM

## 2018-01-06 DIAGNOSIS — M79604 Pain in right leg: Secondary | ICD-10-CM

## 2018-01-06 DIAGNOSIS — M79605 Pain in left leg: Secondary | ICD-10-CM

## 2018-01-06 DIAGNOSIS — M79602 Pain in left arm: Secondary | ICD-10-CM

## 2018-01-07 ENCOUNTER — Ambulatory Visit (HOSPITAL_COMMUNITY)
Admission: RE | Admit: 2018-01-07 | Discharge: 2018-01-07 | Disposition: A | Discharge status: Home / Self Care | Payer: Medicare Other | Source: Ambulatory Visit | Attending: Family Medicine | Admitting: Family Medicine

## 2018-01-07 DIAGNOSIS — M79602 Pain in left arm: Secondary | ICD-10-CM | POA: Insufficient documentation

## 2018-01-07 DIAGNOSIS — I7389 Other specified peripheral vascular diseases: Secondary | ICD-10-CM | POA: Diagnosis not present

## 2018-01-07 DIAGNOSIS — R918 Other nonspecific abnormal finding of lung field: Secondary | ICD-10-CM | POA: Diagnosis not present

## 2018-01-07 DIAGNOSIS — J449 Chronic obstructive pulmonary disease, unspecified: Secondary | ICD-10-CM | POA: Insufficient documentation

## 2018-01-07 DIAGNOSIS — M79605 Pain in left leg: Secondary | ICD-10-CM | POA: Insufficient documentation

## 2018-01-07 DIAGNOSIS — J189 Pneumonia, unspecified organism: Secondary | ICD-10-CM

## 2018-01-07 DIAGNOSIS — M79604 Pain in right leg: Secondary | ICD-10-CM | POA: Diagnosis not present

## 2018-01-07 DIAGNOSIS — I700 Atherosclerosis of aorta: Secondary | ICD-10-CM | POA: Insufficient documentation

## 2018-01-27 ENCOUNTER — Other Ambulatory Visit (HOSPITAL_COMMUNITY): Payer: Self-pay | Admitting: Family Medicine

## 2018-01-27 DIAGNOSIS — R9389 Abnormal findings on diagnostic imaging of other specified body structures: Secondary | ICD-10-CM

## 2018-02-04 ENCOUNTER — Ambulatory Visit (HOSPITAL_COMMUNITY)
Admission: RE | Admit: 2018-02-04 | Discharge: 2018-02-04 | Disposition: A | Discharge status: Home / Self Care | Payer: Medicare Other | Source: Ambulatory Visit | Attending: Family Medicine | Admitting: Family Medicine

## 2018-02-04 DIAGNOSIS — I700 Atherosclerosis of aorta: Secondary | ICD-10-CM | POA: Diagnosis not present

## 2018-02-04 DIAGNOSIS — R590 Localized enlarged lymph nodes: Secondary | ICD-10-CM | POA: Diagnosis not present

## 2018-02-04 DIAGNOSIS — R9389 Abnormal findings on diagnostic imaging of other specified body structures: Secondary | ICD-10-CM | POA: Insufficient documentation

## 2018-02-04 DIAGNOSIS — J432 Centrilobular emphysema: Secondary | ICD-10-CM | POA: Diagnosis not present

## 2018-02-04 DIAGNOSIS — J984 Other disorders of lung: Secondary | ICD-10-CM | POA: Diagnosis not present

## 2018-02-04 DIAGNOSIS — J438 Other emphysema: Secondary | ICD-10-CM | POA: Diagnosis not present

## 2018-02-04 DIAGNOSIS — I2510 Atherosclerotic heart disease of native coronary artery without angina pectoris: Secondary | ICD-10-CM | POA: Diagnosis not present

## 2018-02-04 DIAGNOSIS — R918 Other nonspecific abnormal finding of lung field: Secondary | ICD-10-CM | POA: Insufficient documentation

## 2018-02-04 LAB — POCT I-STAT CREATININE: CREATININE: 0.9 mg/dL (ref 0.61–1.24)

## 2018-02-04 MED ORDER — IOPAMIDOL (ISOVUE-300) INJECTION 61%
75.0000 mL | Freq: Once | INTRAVENOUS | Status: AC | PRN
  Administered 2018-02-04: 75 mL via INTRAVENOUS

## 2018-05-13 IMAGING — CT CT CHEST W/ CM
2 of 3 series · 13 of 36 positions shown, 16 images · IV contrast (iopamidol)
Comparison: Chest CT October 22, 2017 and chest radiograph [place]

CLINICAL DATA: Chronic shortness of breath. Previous cavitary
pneumonia

EXAM:
CT CHEST WITH CONTRAST
TECHNIQUE: Multidetector CT imaging of the chest was performed during
intravenous contrast administration.
CONTRAST:  75mL 7LGBU0-622 IOPAMIDOL (7LGBU0-622) INJECTION 61%

[Series 2: axial st · axial · 0.72mm/px · z∈[+1274,+1584]mm · 10 of 183 slices shown, 13 images]
[im 14/183  mediastinal]
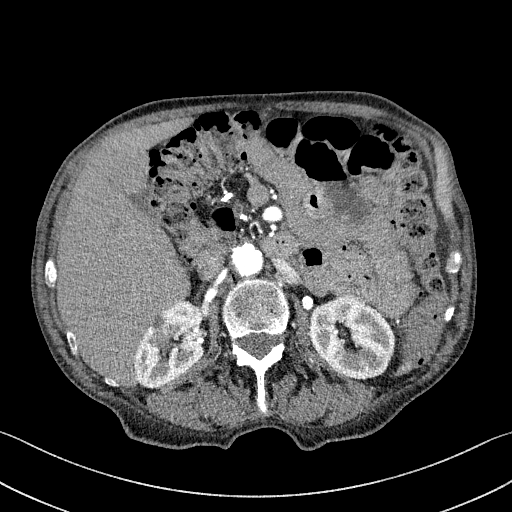
[im 14/183  lung]
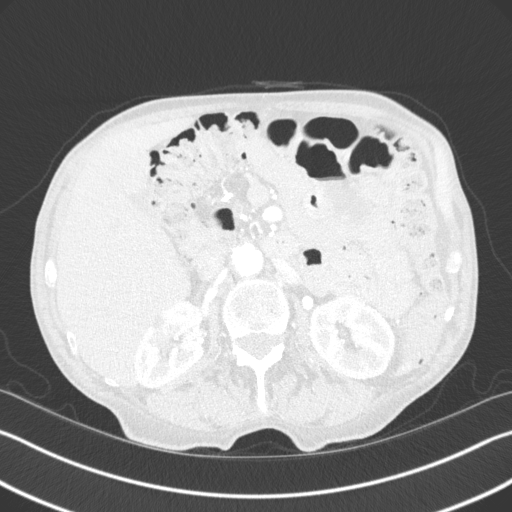
[im 27/183  lung]
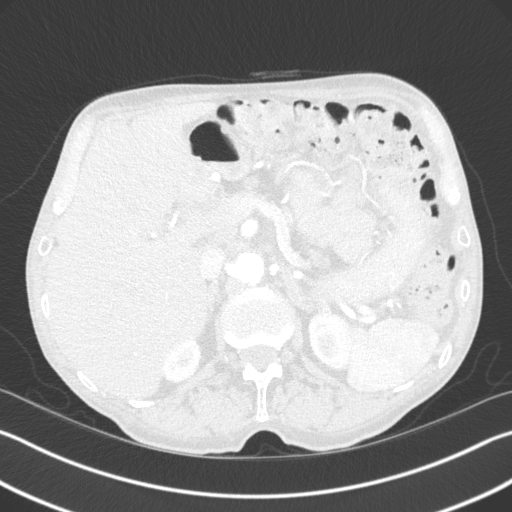
[im 48/183  lung]
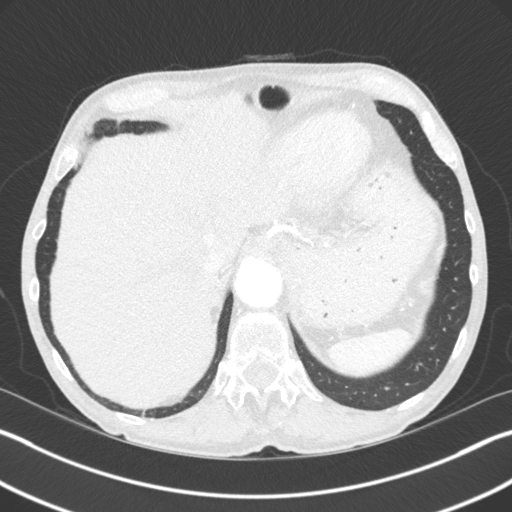
[im 68/183  lung]
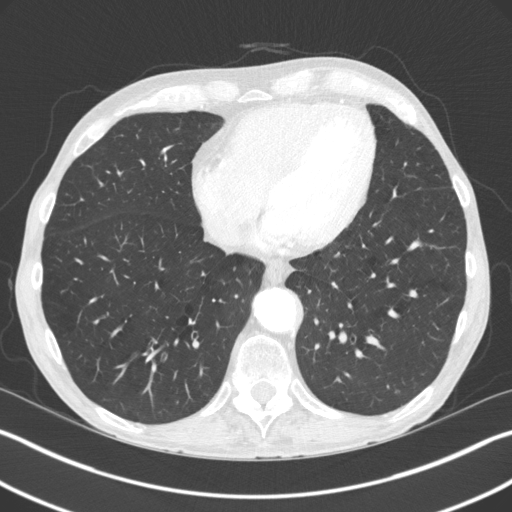
[im 81/183  mediastinal]
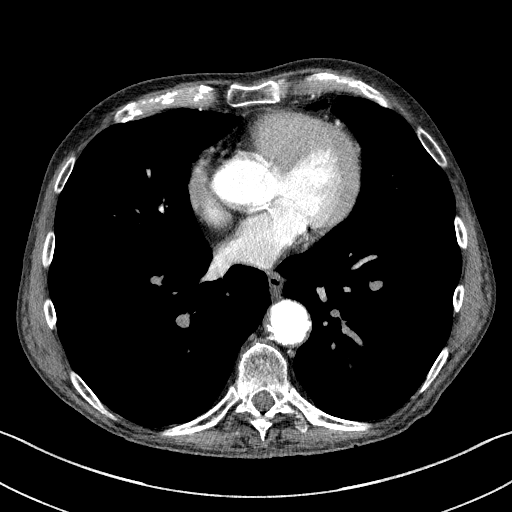
[im 81/183  lung]
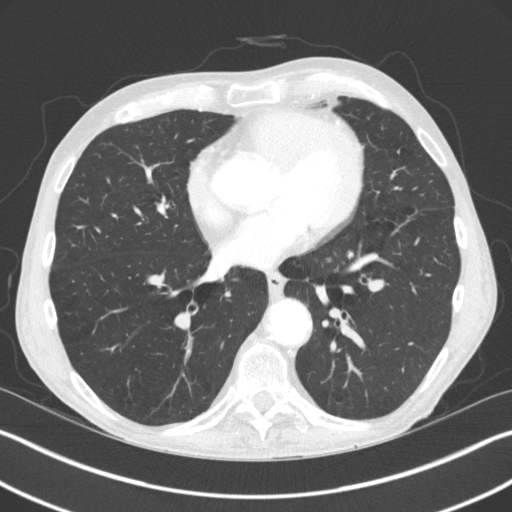
[im 102/183  lung]
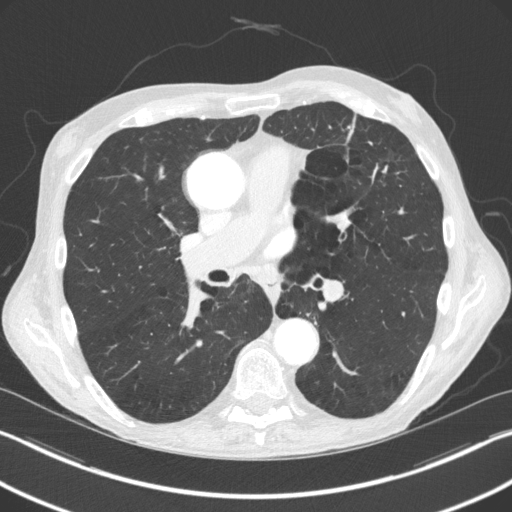
[im 115/183  lung]
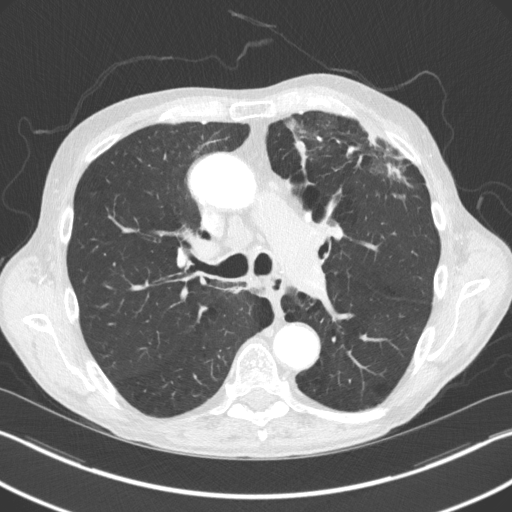
[im 135/183  lung]
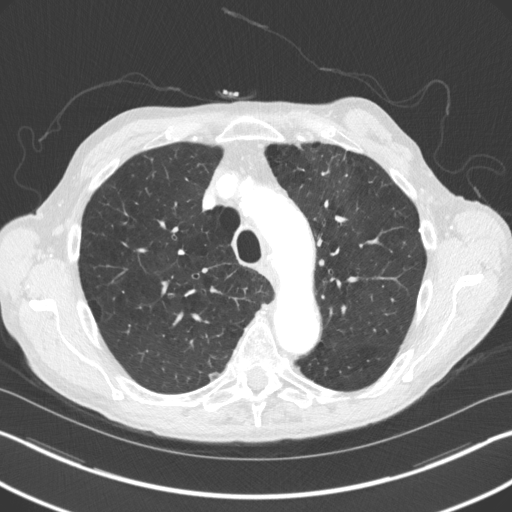
[im 156/183  mediastinal]
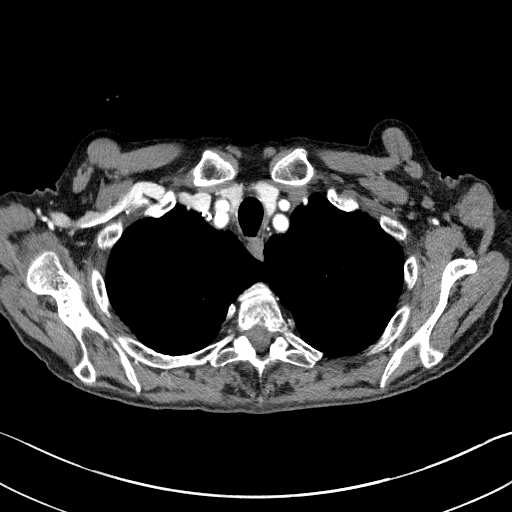
[im 156/183  lung]
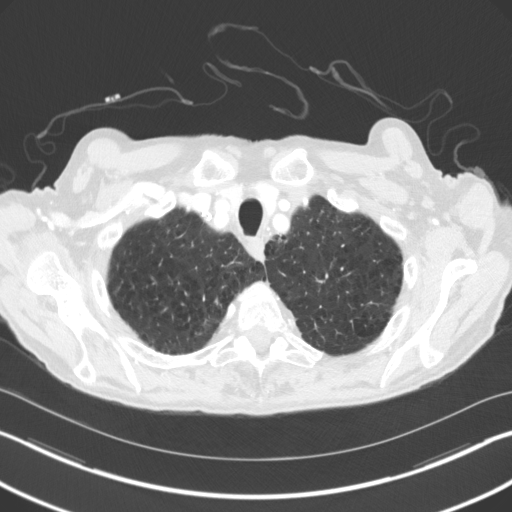
[im 169/183  lung]
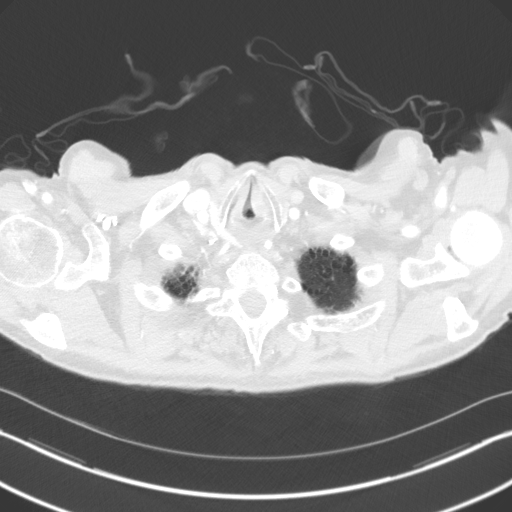

[Series 5: coronal · coronal · 0.66mm/px · 3 of 145 slices shown]
[im 29/145  lung]
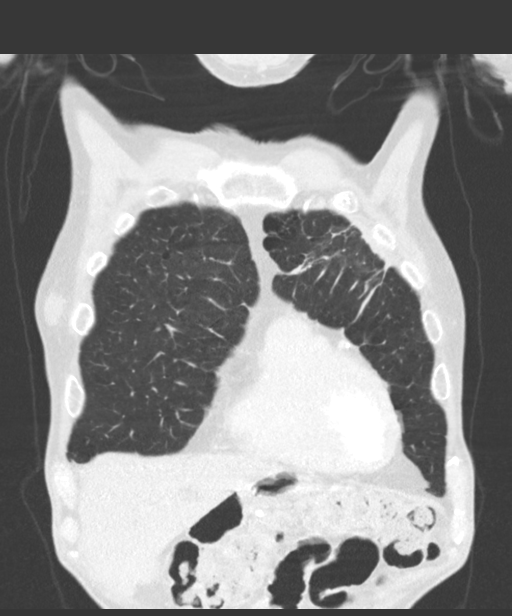
[im 58/145  lung]
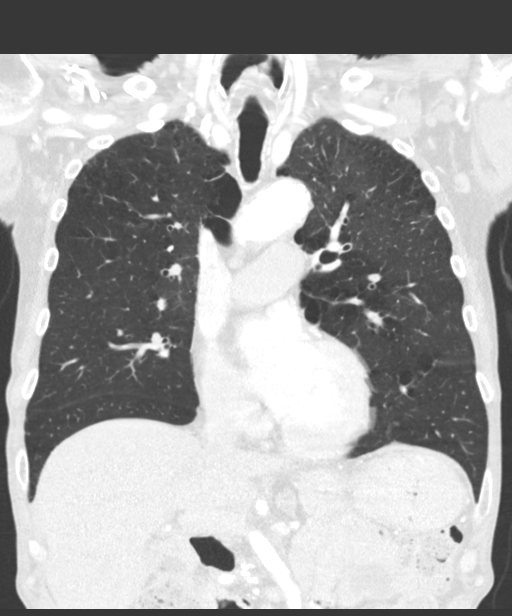
[im 87/145  lung]
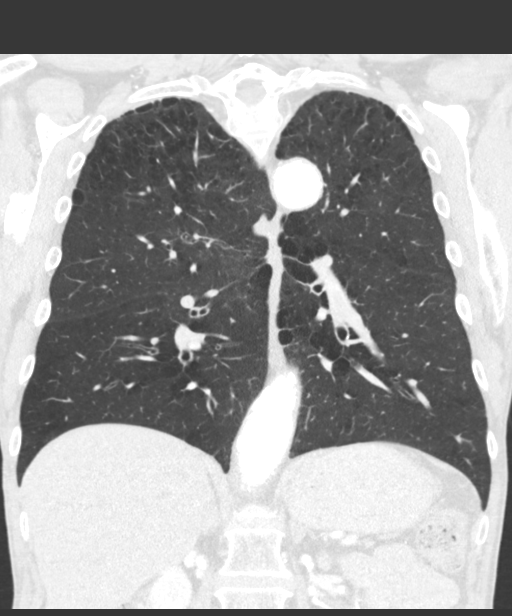

[13 of 36 positions shown; findings below may reference images not displayed]

FINDINGS: Cardiovascular: Ascending thoracic aorta measures 4.1 x 4.0 cm,
unchanged. There is no thoracic aortic dissection. There is this
minimal atherosclerotic calcification in the proximal visualized
great vessels. Visualized great vessels otherwise appear normal.
There are foci of atherosclerotic calcification in the aorta. There
are foci of coronary artery calcification. There is no appreciable
pericardial effusion or pericardial thickening. No major pulmonary
embolus is demonstrable on this study.

Mediastinum/Nodes: Thyroid appears normal. There are scattered
subcentimeter mesenteric lymph nodes. There is a lymph node anterior
to the carina on the right measuring 2.1 x 2.0 cm, stable. There is
a subcarinal lymph node measuring 1.4 x 1.3 cm, slightly smaller
than on recent study. There is a lymph node to the left of the
distal trachea measuring 1.4 x 0.9 cm, marginally smaller than on
previous study. There is a lymph node in the left hilum measuring
1.2 x 1.0 cm, smaller than on prior study. No new adenopathy is
appreciable. No esophageal lesions are evident.

Lungs/Pleura: There is underlying centrilobular and paraseptal
emphysematous change. There has been extensive clearing of
consolidation from the left upper lobe. There is residual
cicatrization in portions of the anterior segment of the left upper
lobe. No cavitation is seen in this area currently. There has been
partial clearing of infiltrate from the medial segment right lower
lobe with mild scarring remaining in this area. There is mild
scarring in the lingula with resolution of patchy infiltrate
compared to the previous study. Currently there is no edema or
consolidation. There is atelectatic change in the right lower lobe.
Previous atelectatic change in the left lower lobe has cleared.

There is a 5 x 4 mm nodular opacity in the posterior segment of the
left upper lobe seen on axial slice 32 series 4, stable. There is a
nodular opacity in the posterior segment of the right upper lobe
abutting the pleura measuring 7 x 6 mm, stable compared to previous
study. This nodular opacity is seen on axial slice 36 series 4. No
new parenchymal nodular opacities are evident. No pleural effusion
or pleural thickening evident.

Upper Abdomen: There is atherosclerotic calcification in the upper
abdominal aorta and major mesenteric vessels. Visualized upper
abdominal structures otherwise appear unremarkable.

Musculoskeletal: No blastic or lytic bone lesions.
IMPRESSION: 1. Underlying emphysematous change. Extensive clearing of apparent
necrotizing pneumonia from the left upper lobe with moderate
residual cicatrization in the anterior segment left upper lobe.
Milder scarring is noted in the lingula. There is no longer
cavitation. No well-defined mass is seen in this area. It should be
noted that there is residual opacification along the periphery of a
portion of the anterior segment left upper lobe which potentially
could mask a small mass/nodular lesion. An additional 3-4 month
follow-up noncontrast CT of the chest may be advisable to further
evaluate in this regard.

2. Significant clearing of right lower lobe infiltrate medially with
mild scarring in this area.

3. Subcentimeter nodular opacities in each lower lobe. Particular
attention to these areas on subsequent evaluations warranted. No new
pulmonary nodular lesions evident.

4. Ascending thoracic aortic diameter 4.1 x 4.0 cm. No dissection.
Recommend annual imaging followup by CTA or MRA. This recommendation
follows 5969 ACCF/AHA/AATS/ACR/ASA/SCA/FEI/HUECK/ATAXCA/SHYANNE Guidelines
for the Diagnosis and Management of Patients with Thoracic Aortic
Disease. Circulation. 5969; 121: [id number].

5. Aortic atherosclerosis. Foci of major mesenteric arterial
calcification as well as foci of coronary artery calcification
noted.

6. Areas of mild adenopathy. Overall, lymph nodes appear either
stable or smaller compared to the previous study suggesting reactive
etiology for the adenopathy.

Aortic Atherosclerosis (KV546-LCT.T) and Emphysema (KV546-3AD.0).

## 2018-05-26 ENCOUNTER — Other Ambulatory Visit (HOSPITAL_COMMUNITY): Payer: Self-pay | Admitting: Family Medicine

## 2018-05-26 DIAGNOSIS — J449 Chronic obstructive pulmonary disease, unspecified: Secondary | ICD-10-CM

## 2018-06-03 ENCOUNTER — Ambulatory Visit (HOSPITAL_COMMUNITY)
Admission: RE | Admit: 2018-06-03 | Discharge: 2018-06-03 | Disposition: A | Discharge status: Home / Self Care | Payer: Medicare Other | Source: Ambulatory Visit | Attending: Family Medicine | Admitting: Family Medicine

## 2018-06-03 DIAGNOSIS — I2510 Atherosclerotic heart disease of native coronary artery without angina pectoris: Secondary | ICD-10-CM | POA: Diagnosis not present

## 2018-06-03 DIAGNOSIS — R918 Other nonspecific abnormal finding of lung field: Secondary | ICD-10-CM | POA: Insufficient documentation

## 2018-06-03 DIAGNOSIS — J449 Chronic obstructive pulmonary disease, unspecified: Secondary | ICD-10-CM

## 2018-06-03 DIAGNOSIS — I700 Atherosclerosis of aorta: Secondary | ICD-10-CM | POA: Insufficient documentation

## 2018-06-03 DIAGNOSIS — J439 Emphysema, unspecified: Secondary | ICD-10-CM | POA: Diagnosis not present

## 2018-08-07 ENCOUNTER — Other Ambulatory Visit (HOSPITAL_COMMUNITY): Payer: Self-pay | Admitting: Nurse Practitioner

## 2018-08-07 ENCOUNTER — Ambulatory Visit (HOSPITAL_COMMUNITY)
Admission: RE | Admit: 2018-08-07 | Discharge: 2018-08-07 | Disposition: A | Discharge status: Home / Self Care | Payer: Medicare Other | Source: Ambulatory Visit | Attending: Nurse Practitioner | Admitting: Nurse Practitioner

## 2018-08-07 ENCOUNTER — Other Ambulatory Visit (HOSPITAL_COMMUNITY)
Admission: RE | Admit: 2018-08-07 | Discharge: 2018-08-07 | Disposition: A | Discharge status: Home / Self Care | Payer: Medicare Other | Source: Ambulatory Visit | Attending: Nurse Practitioner | Admitting: Nurse Practitioner

## 2018-08-07 DIAGNOSIS — R0602 Shortness of breath: Secondary | ICD-10-CM | POA: Diagnosis present

## 2018-08-07 DIAGNOSIS — J449 Chronic obstructive pulmonary disease, unspecified: Secondary | ICD-10-CM | POA: Diagnosis not present

## 2018-08-07 LAB — CBC WITH DIFFERENTIAL/PLATELET
BASOS ABS: 0.1 10*3/uL (ref 0.0–0.1)
BASOS PCT: 1 %
EOS ABS: 0.4 10*3/uL (ref 0.0–0.7)
EOS PCT: 4 %
HCT: 39.9 % (ref 39.0–52.0)
HEMOGLOBIN: 12.8 g/dL — AB (ref 13.0–17.0)
LYMPHS ABS: 3.5 10*3/uL (ref 0.7–4.0)
Lymphocytes Relative: 38 %
MCH: 31.3 pg (ref 26.0–34.0)
MCHC: 32.1 g/dL (ref 30.0–36.0)
MCV: 97.6 fL (ref 78.0–100.0)
Monocytes Absolute: 0.7 10*3/uL (ref 0.1–1.0)
Monocytes Relative: 8 %
NEUTROS PCT: 49 %
Neutro Abs: 4.7 10*3/uL (ref 1.7–7.7)
PLATELETS: 344 10*3/uL (ref 150–400)
RBC: 4.09 MIL/uL — AB (ref 4.22–5.81)
RDW: 13.7 % (ref 11.5–15.5)
WBC: 9.4 10*3/uL (ref 4.0–10.5)

## 2019-02-08 ENCOUNTER — Other Ambulatory Visit: Payer: Self-pay | Admitting: Family Medicine

## 2019-02-08 ENCOUNTER — Other Ambulatory Visit (HOSPITAL_COMMUNITY): Payer: Self-pay | Admitting: Family Medicine

## 2019-02-08 DIAGNOSIS — I77810 Thoracic aortic ectasia: Secondary | ICD-10-CM

## 2019-02-22 ENCOUNTER — Other Ambulatory Visit (HOSPITAL_COMMUNITY): Payer: Medicare Other

## 2019-03-01 ENCOUNTER — Ambulatory Visit (HOSPITAL_COMMUNITY)
Admission: RE | Admit: 2019-03-01 | Discharge: 2019-03-01 | Disposition: A | Discharge status: Home / Self Care | Payer: Medicare Other | Source: Ambulatory Visit | Attending: Family Medicine | Admitting: Family Medicine

## 2019-03-01 DIAGNOSIS — I77810 Thoracic aortic ectasia: Secondary | ICD-10-CM | POA: Insufficient documentation

## 2019-03-01 LAB — POCT I-STAT CREATININE: CREATININE: 0.9 mg/dL (ref 0.61–1.24)

## 2019-03-01 MED ORDER — IOHEXOL 350 MG/ML SOLN
100.0000 mL | Freq: Once | INTRAVENOUS | Status: AC | PRN
  Administered 2019-03-01: 100 mL via INTRAVENOUS

## 2020-02-29 ENCOUNTER — Ambulatory Visit: Payer: Medicare Other | Attending: Internal Medicine

## 2020-02-29 DIAGNOSIS — Z23 Encounter for immunization: Secondary | ICD-10-CM | POA: Insufficient documentation

## 2020-02-29 NOTE — Progress Notes (Signed)
   Covid-19 Vaccination Clinic  Name:  Patrick Beck    MRN: [id number] DOB: 10/02/1946  02/29/2020  [name] was observed post Covid-19 immunization for 30 minutes based on pre-vaccination screening without incident. He was provided with Vaccine Information Sheet and instruction to access the V-Safe system.   [name] was instructed to call 911 with any severe reactions post vaccine: Marland Kitchen Difficulty breathing  . Swelling of face and throat  . A fast heartbeat  . A bad rash all over body  . Dizziness and weakness   Immunizations Administered    Name Date Dose VIS Date Route   Moderna COVID-19 Vaccine 02/29/2020 10:34 AM 0.5 mL 11/30/2019 Intramuscular   Manufacturer: Moderna   Lot: [id number]   [id number]

## 2020-03-28 ENCOUNTER — Ambulatory Visit: Payer: Medicare Other | Attending: Internal Medicine

## 2020-03-28 DIAGNOSIS — Z23 Encounter for immunization: Secondary | ICD-10-CM

## 2020-03-28 NOTE — Progress Notes (Signed)
   Covid-19 Vaccination Clinic  Name:  Marshall Elliff    MRN: [id number] DOB: April 16, 1946  03/28/2020  Mr. Christodoulou was observed post Covid-19 immunization for 15 minutes without incident. He was provided with Vaccine Information Sheet and instruction to access the V-Safe system.   Mr. Blower was instructed to call 911 with any severe reactions post vaccine: Marland Kitchen Difficulty breathing  . Swelling of face and throat  . A fast heartbeat  . A bad rash all over body  . Dizziness and weakness   Immunizations Administered    Name Date Dose VIS Date Route   Moderna COVID-19 Vaccine 03/28/2020 10:36 AM 0.5 mL 11/30/2019 Intramuscular   Manufacturer: Moderna   Lot: [id number]   [place]:[id number]

## 2021-03-05 ENCOUNTER — Other Ambulatory Visit (HOSPITAL_COMMUNITY): Payer: Self-pay | Admitting: Family Medicine

## 2021-03-05 DIAGNOSIS — R0602 Shortness of breath: Secondary | ICD-10-CM

## 2021-03-05 DIAGNOSIS — R011 Cardiac murmur, unspecified: Secondary | ICD-10-CM

## 2021-03-15 ENCOUNTER — Other Ambulatory Visit: Payer: Self-pay

## 2021-03-15 ENCOUNTER — Ambulatory Visit (HOSPITAL_COMMUNITY)
Admission: RE | Admit: 2021-03-15 | Discharge: 2021-03-15 | Disposition: A | Discharge status: Home / Self Care | Payer: Medicare Other | Source: Ambulatory Visit | Attending: Family Medicine | Admitting: Family Medicine

## 2021-03-15 DIAGNOSIS — R011 Cardiac murmur, unspecified: Secondary | ICD-10-CM

## 2021-03-15 DIAGNOSIS — R0602 Shortness of breath: Secondary | ICD-10-CM | POA: Diagnosis not present

## 2021-03-15 LAB — ECHOCARDIOGRAM COMPLETE
AR max vel: 0.72 cm2
AV Area VTI: 0.72 cm2
AV Area mean vel: 0.83 cm2
AV Mean grad: 22 mmHg
AV Peak grad: 36.5 mmHg
Ao pk vel: 3.02 m/s
Area-P 1/2: 1.5 cm2
S' Lateral: 3.2 cm

## 2021-03-15 NOTE — Progress Notes (Signed)
*  PRELIMINARY RESULTS* Echocardiogram 2D Echocardiogram has been performed.  Samuel Germany 03/15/2021, 11:14 AM

## 2021-03-16 ENCOUNTER — Other Ambulatory Visit: Payer: Self-pay

## 2021-03-16 ENCOUNTER — Ambulatory Visit (HOSPITAL_COMMUNITY)
Admission: RE | Admit: 2021-03-16 | Discharge: 2021-03-16 | Disposition: A | Discharge status: Home / Self Care | Payer: Medicare Other | Source: Ambulatory Visit | Attending: Family Medicine | Admitting: Family Medicine

## 2021-03-16 DIAGNOSIS — R0602 Shortness of breath: Secondary | ICD-10-CM | POA: Insufficient documentation

## 2021-03-26 ENCOUNTER — Ambulatory Visit: Payer: Medicare Other | Admitting: Cardiology

## 2021-03-26 ENCOUNTER — Other Ambulatory Visit: Payer: Self-pay

## 2021-03-26 ENCOUNTER — Encounter: Payer: Self-pay | Admitting: Cardiology

## 2021-03-26 VITALS — BP 130/86 | HR 71 | Ht 72.5 in | Wt 140.0 lb

## 2021-03-26 DIAGNOSIS — I10 Essential (primary) hypertension: Secondary | ICD-10-CM | POA: Diagnosis not present

## 2021-03-26 DIAGNOSIS — J449 Chronic obstructive pulmonary disease, unspecified: Secondary | ICD-10-CM

## 2021-03-26 DIAGNOSIS — I350 Nonrheumatic aortic (valve) stenosis: Secondary | ICD-10-CM

## 2021-03-26 DIAGNOSIS — Z720 Tobacco use: Secondary | ICD-10-CM

## 2021-03-26 DIAGNOSIS — E1165 Type 2 diabetes mellitus with hyperglycemia: Secondary | ICD-10-CM

## 2021-03-26 DIAGNOSIS — E782 Mixed hyperlipidemia: Secondary | ICD-10-CM

## 2021-03-26 NOTE — Patient Instructions (Signed)
Medication Instructions:  Your physician recommends that you continue on your current medications as directed. Please refer to the Current Medication list given to you today.  *If you need a refill on your cardiac medications before your next appointment, please call your pharmacy*   Lab Work: None today  If you have labs (blood work) drawn today and your tests are completely normal, you will receive your results only by: Marland Kitchen MyChart Message (if you have MyChart) OR . A paper copy in the mail If you have any lab test that is abnormal or we need to change your treatment, we will call you to review the results.   Testing/Procedures: None today    Follow-Up: At [facility], you and your health needs are our priority.  As part of our continuing mission to provide you with exceptional heart care, we have created designated Provider Care Teams.  These Care Teams include your primary Cardiologist (physician) and Advanced Practice Providers (APPs -  Physician Assistants and Nurse Practitioners) who all work together to provide you with the care you need, when you need it.  We recommend signing up for the patient portal called "MyChart".  Sign up information is provided on this After Visit Summary.  MyChart is used to connect with patients for Virtual Visits (Telemedicine).  Patients are able to view lab/test results, encounter notes, upcoming appointments, etc.  Non-urgent messages can be sent to your provider as well.   To learn more about what you can do with MyChart, go to [email or web address].    Your next appointment:  To be determined with Dr.McDowell after you see the Structural Heart Team   Other Instructions We have referred you to the Structural Heart Team. They will call you for an appointment.       Thank you for choosing [facility] !

## 2021-03-26 NOTE — Progress Notes (Addendum)
Cardiology Office Note  Date: 03/26/2021   ID: Patrick Beck, DOB 22-Nov-1946, MRN [id number]  PCP:  Lemmie Evens, MD  Cardiologist:  Rozann Lesches, MD Electrophysiologist:  None   Chief Complaint  Patient presents with  . Aortic Stenosis    History of Present Illness: Patrick Beck is a 75 y.o. male referred for cardiology consultation by Ms. Ruthann Cancer NP with Dr. Karie Kirks to establish follow-up of aortic stenosis. He is here today with significant other.  He reports fatigue and shortness of breath with activity, this has been going on for the last few years since hospitalization with necrotizing pneumonia.  He has a longstanding tobacco use history and COPD as well (no recent PFTs).  Reports NYHA class III symptoms.  He was recently evaluated at his PCP office, referred for an echocardiogram due to heart murmur and also recent leg swelling responsive to HCTZ and ultimately Lasix.  He had been on enalapril for control of hypertension but was taken off of this due to hyperkalemia, started on amlodipine which also likely contributed to his leg swelling.  He is now off amlodipine.  Recent echocardiogram revealed LVEF 60 to 65% with mild LVH, normal RV contraction, and functionally bicuspid aortic valve with severe calcification, mean gradient 22 mmHg, dimensionless index 0.23, overall severe stenosis.  Interestingly, he had a chest CT in March 2020 that also demonstrated a mildly dilated ascending thoracic aorta at 4.1 cm.  He states that his father underwent aortic valve replacement in his 49s, presumably had a congenitally abnormal valve.  I personally reviewed his ECG today which shows normal sinus rhythm with septal Q waves.  Past Medical History:  Diagnosis Date  . Aortic atherosclerosis ([place])   . Aortic stenosis   . Carotid artery disease ([place])   . COPD (chronic obstructive pulmonary disease) ([place])   . Diverticulosis   . Erectile dysfunction   . Essential hypertension   . GERD  (gastroesophageal reflux disease)   . Glaucoma   . H/O hiatal hernia   . PAD (peripheral artery disease) ([place])   . Type 2 diabetes mellitus ([place])     Past Surgical History:  Procedure Laterality Date  . COLONOSCOPY N/A 04/06/2013   Procedure: COLONOSCOPY;  Surgeon: Jamesetta So, MD;  Location: [facility];  Service: Gastroenterology;  Laterality: N/A;  . HERNIA REPAIR     Three prior surgeries before 1966    Current Outpatient Medications  Medication Sig Dispense Refill  . aspirin 325 MG EC tablet Take 325 mg by mouth daily.    . brimonidine (ALPHAGAN) 0.2 % ophthalmic solution Place 1 drop into both eyes 3 times daily.    . furosemide (LASIX) 40 MG tablet Take 40 mg by mouth daily as needed.    Marland Kitchen glimepiride (AMARYL) 2 MG tablet Take 1 mg by mouth daily before breakfast.    . hydrochlorothiazide (HYDRODIURIL) 12.5 MG tablet Take 12.5 mg by mouth daily.    Marland Kitchen ibuprofen (ADVIL,MOTRIN) 200 MG tablet Take 800 mg by mouth every 6 (six) hours as needed for mild pain.    Marland Kitchen latanoprost (XALATAN) 0.005 % ophthalmic solution PLACE 1 DROP INTO BOTH EYES AT BEDTIME    . metFORMIN (GLUCOPHAGE) 1000 MG tablet Take 1,000 mg by mouth 2 (two) times daily with a meal.    . pravastatin (PRAVACHOL) 40 MG tablet Take 40 mg by mouth daily.    . timolol (BETIMOL) 0.5 % ophthalmic solution Place 1 drop into both eyes daily.  No current facility-administered medications for this visit.   Allergies:  Macadamia nut oil   Social History: The patient  reports that he has been smoking cigarettes. He has a 112.00 pack-year smoking history. He has never used smokeless tobacco. He reports that he does not drink alcohol and does not use drugs.   Family History: The patient's family history includes Aortic stenosis in his father.   ROS: No palpitations or syncope.  Probable claudication.  Physical Exam: VS:  BP 130/86   Pulse 71   Ht 6' 0.5" (1.842 m)   Wt 140 lb (63.5 kg)   SpO2 98%   BMI 18.73 kg/m  , BMI Body mass index is 18.73 kg/m.  Wt Readings from Last 3 Encounters:  03/26/21 140 lb (63.5 kg)  10/22/17 143 lb 7 oz (65.1 kg)  09/17/15 166 lb (75.3 kg)    General: Chronically ill-appearing male, no distress. HEENT: Conjunctiva and lids normal, bearded, wearing a mask. Neck: Supple, no elevated JVP or carotid bruits, no thyromegaly. Lungs: Coarse, decreased breath sounds with scattered rhonchi and no wheezing. Cardiac: Regular rate and rhythm, no S3 3-6/6 systolic murmur, no pericardial rub. Abdomen: Soft, nontender, bowel sounds present. Extremities: Mild ankle edema, distal pulses 2+. Skin: Warm and dry. Musculoskeletal: No kyphosis. Neuropsychiatric: Alert and oriented x3, affect grossly appropriate.  ECG:  No old tracings available for review.  Recent Labwork:  March 2022: Cholesterol 136, HDL 62, triglycerides 89, LDL 57, hemoglobin A1c 8.0%  Other Studies Reviewed Today:  Chest CT 03/01/2019: IMPRESSION: 1. 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 Recommend annual imaging followup by CTA or MRA. This recommendation follows 2010 ACCF/AHA/AATS/ACR/ASA/SCA/SCAI/SIR/STS/SVM Guidelines for the Diagnosis and Management of Patients with Thoracic Aortic Disease. Circulation. 2010; 121: Y403-K742. Aortic aneurysm NOS (ICD10-I71.9) 2. Improving atelectasis/scarring in the anterior lingula. 3. Stable bilateral upper lobe subcentimeter nodules since 2018. 4. Aortic Atherosclerosis (ICD10-I70.0) and Emphysema (ICD10-J43.9).  Echocardiogram 03/15/2021: 1. Images are limited.  2. Left ventricular ejection fraction, by estimation, is 60 to 65%. The  left ventricle has normal function. Left ventricular endocardial border  not optimally defined to evaluate regional wall motion. There is mild left  ventricular hypertrophy. Left  ventricular diastolic parameters are indeterminate.  3. Right ventricular systolic function is normal. The  right ventricular  size is normal. Tricuspid regurgitation signal is inadequate for assessing  PA pressure.  4. The mitral valve is grossly normal. Trivial mitral valve  regurgitation.  5. The aortic valve is likely functionally bicuspid. There is severe  calcifcation of the aortic valve. Aortic valve regurgitation is trivial.  Probable severe aortic valve stenosis based on leallet motion/appearance  as well as dimentionless index 0.23.  Aortic valve mean gradient measures 22 mmHg. Aortic valve Vmax measures  3.02 m/s.  6. The inferior vena cava is normal in size with greater than 50%  respiratory variability, suggesting right atrial pressure of 3 mmHg.   Assessment and Plan:  1.  Functionally bicuspid aortic valve, severely calcified with evidence of severe aortic stenosis based on dimensionless index of 0.23 and valve motion, although mean gradient is only 22 mmHg.  Also mildly dilated ascending aorta at 4.1 cm as of 2020.  He reports fairly chronic dyspnea on exertion and exercise intolerance, could potentially be multifactorial with what seems to be significant COPD and longstanding tobacco use.  Also very likely has underlying ischemic heart disease with previously documented PAD and type 2 diabetes mellitus.  We have discussed these issues  and will plan to refer him to the structural heart clinic for evaluation of potential TAVR.  He is going to need PFTs, assessment of PAD for access determination, reimaging of the chest as it relates to aortic size, and also documentation of coronary anatomy.  2.  Essential hypertension, currently on HCTZ with intermittent use of Lasix per PCP for control of leg swelling.  Renal function uncertain at this time.  Apparently, he did have hyperkalemia on enalapril which was discontinued and could have some degree of renal artery stenosis.  3.  Previously documented PAD, reports leg weakness and potential claudication.  4.  Longstanding tobacco abuse  with COPD.  5.  Mixed hyperlipidemia, recent LDL 57 on Pravachol.  6.  Type 2 diabetes mellitus, currently on Amaryl and Glucophage with follow-up by PCP.  Medication Adjustments/Labs and Tests Ordered: Current medicines are reviewed at length with the patient today.  Concerns regarding medicines are outlined above.   Tests Ordered: Orders Placed This Encounter  Procedures  . EKG 12-Lead    Medication Changes: No orders of the defined types were placed in this encounter.   Disposition:  Follow up after assessment in the structural heart clinic.  Signed, Satira Sark, MD, [facility] 03/26/2021 1:54 PM    [place] at [facility] [address] [place], [place] [address] Phone: [phone]; Fax: [phone]

## 2021-04-04 ENCOUNTER — Encounter: Payer: Self-pay | Admitting: Cardiovascular Disease

## 2021-04-04 ENCOUNTER — Ambulatory Visit: Payer: Medicare Other | Admitting: Cardiovascular Disease

## 2021-04-04 ENCOUNTER — Other Ambulatory Visit: Payer: Self-pay

## 2021-04-04 VITALS — BP 148/80 | HR 68 | Ht 72.05 in | Wt 142.8 lb

## 2021-04-04 DIAGNOSIS — I350 Nonrheumatic aortic (valve) stenosis: Secondary | ICD-10-CM

## 2021-04-04 DIAGNOSIS — Z01812 Encounter for preprocedural laboratory examination: Secondary | ICD-10-CM

## 2021-04-04 NOTE — H&P (View-Only) (Signed)
Structural Heart Clinic Consult Note  Chief Complaint  Patient presents with  . New Patient (Initial Visit)    Severe aortic stenosis    History of Present Illness: 75 yo male with history of carotid artery disease, tobacco abuse, COPD, HTN, GERD, diabetes mellitus and severe aortic stenosis who is here today as a new consult, referred by Dr. Domenic Polite, for further discussion regarding his aortic stenosis and possible TAVR. He has history of necrotizing pneumonia 2 years ago, ongoing tobacco abuse and COPD. He smokes 2 ppd and has done this for 55 years. Recently seen by Dr. Domenic Polite after a murmur was heard in primary care. He has ongoing dyspnea, fatigue and LE edema. Echo 03/15/21 with LVEF=60-65%, mild LVH. The aortic valve leaflets are thickened and calcified. Likely bicuspid valve. Mean gradient 22 mmHg, peak gradient 36.5 mmHg, AVA 0.72 cm2, dimensionless index 0.23, SVI 24. This is consistent with severe aortic stenosis.   He tells me today that he has progressive dyspnea with exertion. No chest pain. He has  Dizziness but no near syncope. He has LE edema. He lives in [place], [place]. He is divorced and has had a girlfriend for 13 years. She is with him here today. He has poor dentition and does not see a dentist. He is retired from [employer].   Primary Care Physician: Lemmie Evens, MD Primary Cardiologist: Domenic Polite Referring Cardiologist: Domenic Polite  Past Medical History:  Diagnosis Date  . Aortic atherosclerosis ([place])   . Aortic stenosis   . Carotid artery disease ([place])   . COPD (chronic obstructive pulmonary disease) ([place])   . Diverticulosis   . Erectile dysfunction   . Essential hypertension   . GERD (gastroesophageal reflux disease)   . Glaucoma   . H/O hiatal hernia   . PAD (peripheral artery disease) ([place])   . Type 2 diabetes mellitus ([place])     Past Surgical History:  Procedure Laterality Date  . COLONOSCOPY N/A 04/06/2013   Procedure: COLONOSCOPY;   Surgeon: Jamesetta So, MD;  Location[facility];  Service: Gastroenterology;  Laterality: N/A;  . HERNIA REPAIR     Three prior surgeries before 1966    Current Outpatient Medications  Medication Sig Dispense Refill  . aspirin 325 MG EC tablet Take 325 mg by mouth daily.    . brimonidine (ALPHAGAN) 0.2 % ophthalmic solution Place 1 drop into both eyes 3 times daily.    . furosemide (LASIX) 40 MG tablet Take 40 mg by mouth daily as needed.    Marland Kitchen glimepiride (AMARYL) 2 MG tablet Take 1 mg by mouth daily before breakfast.    . hydrochlorothiazide (HYDRODIURIL) 12.5 MG tablet Take 12.5 mg by mouth daily.    Marland Kitchen ibuprofen (ADVIL,MOTRIN) 200 MG tablet Take 800 mg by mouth every 6 (six) hours as needed for mild pain.    Marland Kitchen latanoprost (XALATAN) 0.005 % ophthalmic solution PLACE 1 DROP INTO BOTH EYES AT BEDTIME    . metFORMIN (GLUCOPHAGE) 1000 MG tablet Take 1,000 mg by mouth 2 (two) times daily with a meal.    . pravastatin (PRAVACHOL) 40 MG tablet Take 40 mg by mouth daily.    . timolol (BETIMOL) 0.5 % ophthalmic solution Place 1 drop into both eyes daily.     No current facility-administered medications for this visit.    Allergies  Allergen Reactions  . Macadamia Nut Oil     Throat swelling and can't breathe    Social History   Socioeconomic History  .  Marital status: Divorced    Spouse name: Not on file  . Number of children: 0  . Years of education: Not on file  . Highest education level: Not on file  Occupational History  . 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  Tobacco Use  . Smoking status: Heavy Tobacco Smoker    Packs/day: 2.00    Years: 56.00    Pack years: 112.00    Types: Cigarettes  . Smokeless tobacco: Never Used  . Tobacco comment: pt refuses hand outs   Substance and Sexual Activity  . Alcohol use: No  . Drug use: No  . Sexual activity: Not on file  Other Topics Concern  . Not on file  Social History Narrative  . Not on file   Social  Determinants of Health   Financial Resource Strain: Not on file  Food Insecurity: Not on file  Transportation Needs: Not on file  Physical Activity: Not on file  Stress: Not on file  Social Connections: Not on file  Intimate Partner Violence: Not on file    Family History  Problem Relation Age of Onset  . Aortic stenosis Father   . COPD Mother   . Cancer Neg Hx     Review of Systems:  As stated in the HPI and otherwise negative.   BP (!) 148/80   Pulse 68   Ht 6' 0.05" (1.83 m)   Wt 142 lb 12.8 oz (64.8 kg)   SpO2 96%   BMI 19.34 kg/m   Physical Examination: General: Well developed, well nourished, NAD  HEENT: OP clear, mucus membranes moist  SKIN: warm, dry. No rashes. Neuro: No focal deficits  Musculoskeletal: Muscle strength 5/5 all ext  Psychiatric: Mood and affect normal  Neck: No JVD, no carotid bruits, no thyromegaly, no lymphadenopathy.  Lungs:Clear bilaterally, no wheezes, rhonci, crackles. Poor air movement bilaterally.  Cardiovascular: Regular rate and rhythm. Distant heart sounds. Systolic murmur noted.   Abdomen:Soft. Bowel sounds present. Non-tender.  Extremities: No lower extremity edema. Pulses are 2 + in the bilateral DP/PT.  EKG:  EKG is not ordered today. The ekg ordered today demonstrates   Echo 03/15/21: 1. Images are limited.  2. Left ventricular ejection fraction, by estimation, is 60 to 65%. The  left ventricle has normal function. Left ventricular endocardial border  not optimally defined to evaluate regional wall motion. There is mild left  ventricular hypertrophy. Left  ventricular diastolic parameters are indeterminate.  3. Right ventricular systolic function is normal. The right ventricular  size is normal. Tricuspid regurgitation signal is inadequate for assessing  PA pressure.  4. The mitral valve is grossly normal. Trivial mitral valve  regurgitation.  5. The aortic valve is likely functionally bicuspid. There is severe   calcifcation of the aortic valve. Aortic valve regurgitation is trivial.  Probable severe aortic valve stenosis based on leallet motion/appearance  as well as dimentionless index 0.23.  Aortic valve mean gradient measures 22 mmHg. Aortic valve Vmax measures  3.02 m/s.  6. The inferior vena cava is normal in size with greater than 50%  respiratory variability, suggesting right atrial pressure of 3 mmHg.   FINDINGS  Left Ventricle: Left ventricular ejection fraction, by estimation, is 60  to 65%. The left ventricle has normal function. Left ventricular  endocardial border not optimally defined to evaluate regional wall motion.  The left ventricular internal cavity  size was normal in size. There is mild left ventricular hypertrophy. Left  ventricular diastolic parameters are indeterminate.   Right  Ventricle: The right ventricular size is normal. No increase in  right ventricular wall thickness. Right ventricular systolic function is  normal. Tricuspid regurgitation signal is inadequate for assessing PA  pressure.   Left Atrium: Left atrial size was normal in size.   Right Atrium: Right atrial size was normal in size.   Pericardium: There is no evidence of pericardial effusion.   Mitral Valve: The mitral valve is grossly normal. Trivial mitral valve  regurgitation.   Tricuspid Valve: The tricuspid valve is grossly normal. Tricuspid valve  regurgitation is trivial.   Aortic Valve: The aortic valve is bicuspid. There is severe calcifcation  of the aortic valve. Aortic valve regurgitation is trivial. Severe aortic  stenosis is present. Aortic valve mean gradient measures 22.0 mmHg. Aortic  valve peak gradient measures  36.5 mmHg. Aortic valve area, by VTI measures 0.72 cm.   Pulmonic Valve: The pulmonic valve was grossly normal. Pulmonic valve  regurgitation is trivial.   Aorta: The aortic root is normal in size and structure.   Venous: The inferior vena cava is normal in  size with greater than 50%  respiratory variability, suggesting right atrial pressure of 3 mmHg.   IAS/Shunts: No atrial level shunt detected by color flow Doppler.     LEFT VENTRICLE  PLAX 2D  LVIDd:     4.60 cm Diastology  LVIDs:     3.20 cm LV e' medial:  6.74 cm/s  LV PW:     1.00 cm LV E/e' medial: 7.1  LV IVS:    1.10 cm LV e' lateral:  7.18 cm/s  LVOT diam:   2.00 cm LV E/e' lateral: 6.7  LV SV:     45  LV SV Index:  24  LVOT Area:   3.14 cm     RIGHT VENTRICLE  RV S prime:   12.60 cm/s  TAPSE (M-mode): 1.5 cm   LEFT ATRIUM       Index    RIGHT ATRIUM      Index  LA diam:    3.10 cm 1.66 cm/m RA Area:   16.10 cm  LA Vol (A2C):  47.4 ml 25.37 ml/m RA Volume:  41.60 ml 22.27 ml/m  LA Vol (A4C):  49.6 ml 26.55 ml/m  LA Biplane Vol: 53.2 ml 28.48 ml/m  AORTIC VALVE  AV Area (Vmax):  0.72 cm  AV Area (Vmean):  0.83 cm  AV Area (VTI):   0.72 cm  AV Vmax:      302.00 cm/s  AV Vmean:     208.000 cm/s  AV VTI:      0.623 m  AV Peak Grad:   36.5 mmHg  AV Mean Grad:   22.0 mmHg  LVOT Vmax:     69.00 cm/s  LVOT Vmean:    55.100 cm/s  LVOT VTI:     0.143 m  LVOT/AV VTI ratio: 0.23    AORTA  Ao Root diam: 3.10 cm   MITRAL VALVE  MV Area (PHT): 1.50 cm  SHUNTS  MV Decel Time: 507 msec  Systemic VTI: 0.14 m  MV E velocity: 48.00 cm/s Systemic Diam: 2.00 cm  MV A velocity: 82.10 cm/s  MV E/A ratio: 0.58   Recent Labs: No results found for requested labs within last 8760 hours.   Lipid Panel No results found for: CHOL, TRIG, HDL, CHOLHDL, VLDL, LDLCALC, LDLDIRECT   Wt Readings from Last 3 Encounters:  04/04/21 142 lb 12.8 oz (64.8 kg)  03/26/21 140 lb (63.5  kg)  10/22/17 143 lb 7 oz (65.1 kg)     Other studies Reviewed: Additional studies/ records that were reviewed today include: echo images, EKG, office notes Review of the above records  demonstrates: severe aortic stenosis   STS Risk Score: Procedure: AVR + CAB Risk of Mortality: 1.640% Renal Failure: 1.221% Permanent Stroke: 1.808% Prolonged Ventilation: 5.887% DSW Infection: 0.126% Reoperation: 4.153% Morbidity or Mortality: 10.070% Short Length of Stay: 42.099% Long Length of Stay: 5.440%    Assessment and Plan:   1. Severe Aortic Valve Stenosis: He has severe, stage D aortic valve stenosis. I have personally reviewed the echo images. The aortic valve is thickened, calcified with limited leaflet mobility. I think he would benefit from AVR.  I suspect that he will have obstructive CAD given his long standing history of tobacco abuse. He may also have advanced lung disease. He has some degree of PAD so access may be an issue as well. At this time, I would consider him to be a candidate for surgical AVR with bypass if needed vs TAVR.  Further plans to follow after iniitial testing.   I have reviewed the natural history of aortic stenosis with the patient and their family members  who are present today. We have discussed the limitations of medical therapy and the poor prognosis associated with symptomatic aortic stenosis. We have reviewed potential treatment options, including palliative medical therapy, conventional surgical aortic valve replacement, and transcatheter aortic valve replacement. We discussed treatment options in the context of the patient's specific comorbid medical conditions.   He would like to proceed with planning for TAVR. I will arrange a right and left heart catheterization at [facility] 04/18/[facility]. Risks and benefits of the cath procedure and the valve procedure are reviewed with the patient. After the cath, he will have a cardiac CT, CTA of the chest/abdomen and pelvis, carotid artery dopplers, PT assessment and PFTs and will then be referred to see one of the CT surgeons on our TAVR team.   BMET and CBC today.     He has poor dentition and will  need a dental consult prior to AVR.   Current medicines are reviewed at length with the patient today.  The patient does not have concerns regarding medicines.  The following changes have been made:  no change  Labs/ tests ordered today include:   Orders Placed This Encounter  Procedures  . Basic metabolic panel  . CBC     Disposition:   F/U with the valve team.    Signed, [name], MD 04/04/2021 1:29 PM    [facility] [place], [place], [place]  [address] Phone: [phone]; Fax: [phone]

## 2021-04-04 NOTE — Progress Notes (Addendum)
[facility]ural [facility] Clinic Consult Note  Chief Complaint  Patient presents with  . New Patient (Initial Visit)    Severe aortic stenosis    History of Present Illness: 75 yo male with history of carotid artery disease, tobacco abuse, COPD, HTN, GERD, diabetes mellitus and severe aortic stenosis who is here today as a new consult, referred by Dr. Domenic Polite, for further discussion regarding his aortic stenosis and possible TAVR. He has history of necrotizing pneumonia 2 years ago, ongoing tobacco abuse and COPD. He smokes 2 ppd and has done this for 55 years. Recently seen by Dr. Domenic Polite after a murmur was heard in primary care. He has ongoing dyspnea, fatigue and LE edema. Echo 03/15/21 with LVEF=60-65%, mild LVH. The aortic valve leaflets are thickened and calcified. Likely bicuspid valve. Mean gradient 22 mmHg, peak gradient 36.5 mmHg, AVA 0.72 cm2, dimensionless index 0.23, SVI 24. This is consistent with severe aortic stenosis.   He tells me today that he has progressive dyspnea with exertion. No chest pain. He has  Dizziness but no near syncope. He has LE edema. He lives in [place], [place]. He is divorced and has had a girlfriend for 13 years. She is with him here today. He has poor dentition and does not see a dentist. He is retired from [employer].   Primary Care Physician: Lemmie Evens, MD Primary Cardiologist: Domenic Polite Referring Cardiologist: Domenic Polite  Past Medical History:  Diagnosis Date  . Aortic atherosclerosis ([place])   . Aortic stenosis   . Carotid artery disease ([place])   . COPD (chronic obstructive pulmonary disease) ([place])   . Diverticulosis   . Erectile dysfunction   . Essential hypertension   . GERD (gastroesophageal reflux disease)   . Glaucoma   . H/O hiatal hernia   . PAD (peripheral artery disease) ([place])   . Type 2 diabetes mellitus ([place])     Past Surgical History:  Procedure Laterality Date  . COLONOSCOPY N/A 04/06/2013   Procedure: COLONOSCOPY;   Surgeon: Jamesetta So, MD;  Location: [facility];  Service: Gastroenterology;  Laterality: N/A;  . HERNIA REPAIR     Three prior surgeries before 1966    Current Outpatient Medications  Medication Sig Dispense Refill  . aspirin 325 MG EC tablet Take 325 mg by mouth daily.    . brimonidine (ALPHAGAN) 0.2 % ophthalmic solution Place 1 drop into both eyes 3 times daily.    . furosemide (LASIX) 40 MG tablet Take 40 mg by mouth daily as needed.    Marland Kitchen glimepiride (AMARYL) 2 MG tablet Take 1 mg by mouth daily before breakfast.    . hydrochlorothiazide (HYDRODIURIL) 12.5 MG tablet Take 12.5 mg by mouth daily.    Marland Kitchen ibuprofen (ADVIL,MOTRIN) 200 MG tablet Take 800 mg by mouth every 6 (six) hours as needed for mild pain.    Marland Kitchen latanoprost (XALATAN) 0.005 % ophthalmic solution PLACE 1 DROP INTO BOTH EYES AT BEDTIME    . metFORMIN (GLUCOPHAGE) 1000 MG tablet Take 1,000 mg by mouth 2 (two) times daily with a meal.    . pravastatin (PRAVACHOL) 40 MG tablet Take 40 mg by mouth daily.    . timolol (BETIMOL) 0.5 % ophthalmic solution Place 1 drop into both eyes daily.     No current facility-administered medications for this visit.    Allergies  Allergen Reactions  . Macadamia Nut Oil     Throat swelling and can't breathe    Social History   Socioeconomic History  .  Marital status: Divorced    Spouse name: Not on file  . Number of children: 0  . Years of education: Not on file  . Highest education level: Not on file  Occupational History  . 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  Tobacco Use  . Smoking status: Heavy Tobacco Smoker    Packs/day: 2.00    Years: 56.00    Pack years: 112.00    Types: Cigarettes  . Smokeless tobacco: Never Used  . Tobacco comment: pt refuses hand outs   Substance and Sexual Activity  . Alcohol use: No  . Drug use: No  . Sexual activity: Not on file  Other Topics Concern  . Not on file  Social History Narrative  . Not on file   Social  Determinants of Health   Financial Resource Strain: Not on file  Food Insecurity: Not on file  Transportation Needs: Not on file  Physical Activity: Not on file  Stress: Not on file  Social Connections: Not on file  Intimate Partner Violence: Not on file    Family History  Problem Relation Age of Onset  . Aortic stenosis Father   . COPD Mother   . Cancer Neg Hx     Review of Systems:  As stated in the HPI and otherwise negative.   BP (!) 148/80   Pulse 68   Ht 6' 0.05" (1.83 m)   Wt 142 lb 12.8 oz (64.8 kg)   SpO2 96%   BMI 19.34 kg/m   Physical Examination: General: Well developed, well nourished, NAD  HEENT: OP clear, mucus membranes moist  SKIN: warm, dry. No rashes. Neuro: No focal deficits  Musculoskeletal: Muscle strength 5/5 all ext  Psychiatric: Mood and affect normal  Neck: No JVD, no carotid bruits, no thyromegaly, no lymphadenopathy.  Lungs:Clear bilaterally, no wheezes, rhonci, crackles. Poor air movement bilaterally.  Cardiovascular: Regular rate and rhythm. Distant heart sounds. Systolic murmur noted.   Abdomen:Soft. Bowel sounds present. Non-tender.  Extremities: No lower extremity edema. Pulses are 2 + in the bilateral DP/PT.  EKG:  EKG is not ordered today. The ekg ordered today demonstrates   Echo 03/15/21: 1. Images are limited.  2. Left ventricular ejection fraction, by estimation, is 60 to 65%. The  left ventricle has normal function. Left ventricular endocardial border  not optimally defined to evaluate regional wall motion. There is mild left  ventricular hypertrophy. Left  ventricular diastolic parameters are indeterminate.  3. Right ventricular systolic function is normal. The right ventricular  size is normal. Tricuspid regurgitation signal is inadequate for assessing  PA pressure.  4. The mitral valve is grossly normal. Trivial mitral valve  regurgitation.  5. The aortic valve is likely functionally bicuspid. There is severe   calcifcation of the aortic valve. Aortic valve regurgitation is trivial.  Probable severe aortic valve stenosis based on leallet motion/appearance  as well as dimentionless index 0.23.  Aortic valve mean gradient measures 22 mmHg. Aortic valve Vmax measures  3.02 m/s.  6. The inferior vena cava is normal in size with greater than 50%  respiratory variability, suggesting right atrial pressure of 3 mmHg.   FINDINGS  Left Ventricle: Left ventricular ejection fraction, by estimation, is 60  to 65%. The left ventricle has normal function. Left ventricular  endocardial border not optimally defined to evaluate regional wall motion.  The left ventricular internal cavity  size was normal in size. There is mild left ventricular hypertrophy. Left  ventricular diastolic parameters are indeterminate.   Right  Ventricle: The right ventricular size is normal. No increase in  right ventricular wall thickness. Right ventricular systolic function is  normal. Tricuspid regurgitation signal is inadequate for assessing PA  pressure.   Left Atrium: Left atrial size was normal in size.   Right Atrium: Right atrial size was normal in size.   Pericardium: There is no evidence of pericardial effusion.   Mitral Valve: The mitral valve is grossly normal. Trivial mitral valve  regurgitation.   Tricuspid Valve: The tricuspid valve is grossly normal. Tricuspid valve  regurgitation is trivial.   Aortic Valve: The aortic valve is bicuspid. There is severe calcifcation  of the aortic valve. Aortic valve regurgitation is trivial. Severe aortic  stenosis is present. Aortic valve mean gradient measures 22.0 mmHg. Aortic  valve peak gradient measures  36.5 mmHg. Aortic valve area, by VTI measures 0.72 cm.   Pulmonic Valve: The pulmonic valve was grossly normal. Pulmonic valve  regurgitation is trivial.   Aorta: The aortic root is normal in size and structure.   Venous: The inferior vena cava is normal in  size with greater than 50%  respiratory variability, suggesting right atrial pressure of 3 mmHg.   IAS/Shunts: No atrial level shunt detected by color flow Doppler.     LEFT VENTRICLE  PLAX 2D  LVIDd:     4.60 cm Diastology  LVIDs:     3.20 cm LV e' medial:  6.74 cm/s  LV PW:     1.00 cm LV E/e' medial: 7.1  LV IVS:    1.10 cm LV e' lateral:  7.18 cm/s  LVOT diam:   2.00 cm LV E/e' lateral: 6.7  LV SV:     45  LV SV Index:  24  LVOT Area:   3.14 cm     RIGHT VENTRICLE  RV S prime:   12.60 cm/s  TAPSE (M-mode): 1.5 cm   LEFT ATRIUM       Index    RIGHT ATRIUM      Index  LA diam:    3.10 cm 1.66 cm/m RA Area:   16.10 cm  LA Vol (A2C):  47.4 ml 25.37 ml/m RA Volume:  41.60 ml 22.27 ml/m  LA Vol (A4C):  49.6 ml 26.55 ml/m  LA Biplane Vol: 53.2 ml 28.48 ml/m  AORTIC VALVE  AV Area (Vmax):  0.72 cm  AV Area (Vmean):  0.83 cm  AV Area (VTI):   0.72 cm  AV Vmax:      302.00 cm/s  AV Vmean:     208.000 cm/s  AV VTI:      0.623 m  AV Peak Grad:   36.5 mmHg  AV Mean Grad:   22.0 mmHg  LVOT Vmax:     69.00 cm/s  LVOT Vmean:    55.100 cm/s  LVOT VTI:     0.143 m  LVOT/AV VTI ratio: 0.23    AORTA  Ao Root diam: 3.10 cm   MITRAL VALVE  MV Area (PHT): 1.50 cm  SHUNTS  MV Decel Time: 507 msec  Systemic VTI: 0.14 m  MV E velocity: 48.00 cm/s Systemic Diam: 2.00 cm  MV A velocity: 82.10 cm/s  MV E/A ratio: 0.58   Recent Labs: No results found for requested labs within last 8760 hours.   Lipid Panel No results found for: CHOL, TRIG, HDL, CHOLHDL, VLDL, LDLCALC, LDLDIRECT   Wt Readings from Last 3 Encounters:  04/04/21 142 lb 12.8 oz (64.8 kg)  03/26/21 140 lb (63.5  kg)  10/22/17 143 lb 7 oz (65.1 kg)     Other studies Reviewed: Additional studies/ records that were reviewed today include: echo images, EKG, office notes Review of the above records  demonstrates: severe aortic stenosis   STS Risk Score: Procedure: AVR + CAB Risk of Mortality: 1.640% Renal Failure: 1.221% Permanent Stroke: 1.808% Prolonged Ventilation: 5.887% DSW Infection: 0.126% Reoperation: 4.153% Morbidity or Mortality: 10.070% Short Length of Stay: 42.099% Long Length of Stay: 5.440%    Assessment and Plan:   1. Severe Aortic Valve Stenosis: He has severe, stage D aortic valve stenosis. I have personally reviewed the echo images. The aortic valve is thickened, calcified with limited leaflet mobility. I think he would benefit from AVR.  I suspect that he will have obstructive CAD given his long standing history of tobacco abuse. He may also have advanced lung disease. He has some degree of PAD so access may be an issue as well. At this time, I would consider him to be a candidate for surgical AVR with bypass if needed vs TAVR.  Further plans to follow after iniitial testing.   I have reviewed the natural history of aortic stenosis with the patient and their family members  who are present today. We have discussed the limitations of medical therapy and the poor prognosis associated with symptomatic aortic stenosis. We have reviewed potential treatment options, including palliative medical therapy, conventional surgical aortic valve replacement, and transcatheter aortic valve replacement. We discussed treatment options in the context of the patient's specific comorbid medical conditions.   He would like to proceed with planning for TAVR. I will arrange a right and left heart catheterization at [facility] 04/18/21. Risks and benefits of the cath procedure and the valve procedure are reviewed with the patient. After the cath, he will have a cardiac CT, CTA of the chest/abdomen and pelvis, carotid artery dopplers, PT assessment and PFTs and will then be referred to see one of the CT surgeons on our TAVR team.   BMET and CBC today.     He has poor dentition and will  need a dental consult prior to AVR.   Current medicines are reviewed at length with the patient today.  The patient does not have concerns regarding medicines.  The following changes have been made:  no change  Labs/ tests ordered today include:   Orders Placed This Encounter  Procedures  . Basic metabolic panel  . CBC     Disposition:   F/U with the valve team.    Signed, [name], MD 04/04/2021 1:29 PM    [facility] [place], [place], [place]  [address] Phone: [phone]; Fax: [phone]

## 2021-04-04 NOTE — Patient Instructions (Signed)
Medication Instructions:  No changes   Lab Work: Today: bmet, cbc  Testing/Procedures: Your physician has requested that you have a cardiac catheterization. Cardiac catheterization is used to diagnose and/or treat various heart conditions. Doctors may recommend this procedure for a number of different reasons. The most common reason is to evaluate chest pain. Chest pain can be a symptom of coronary artery disease (CAD), and cardiac catheterization can show whether plaque is narrowing or blocking your heart's arteries. This procedure is also used to evaluate the valves, as well as measure the blood flow and oxygen levels in different parts of your heart. For further information please visit [facility]uge[facility]iest[facility]tn. Please follow instruction sheet, as given.  Follow-Up: We will contact you to arrange next steps in testing/work up for TAVR     [facility] [place] OFFICE [place], [place] [address] Dept: [phone] Loc: [phone]  Patrick Beck  04/04/2021  You are scheduled for a Cardiac Catheterization on Wednesday April 20 with Dr. [name].  1. Please arrive at the [facility] (Main Entrance A) at [facility]: [address] [place], [place] [address] at 9:30 am (This time is two hours before your procedure to ensure your preparation). Free valet parking service is available.   Special note: Every effort is made to have your procedure done on time. Please understand that emergencies sometimes delay scheduled procedures.  2. Diet: Do not eat solid foods after midnight.  The patient may have clear liquids until 5am upon the day of the procedure.  3. Labs: You will need to have blood drawn on Wednesday, April 6 at [facility] at [facility]. [address]  Open: 7:30am - 5pm    Phone: [phone]. You do not need to be fasting. Due to recent COVID-19  restrictions implemented by our local and state authorities and in an effort to keep both patients and staff as safe as possible, our hospital system requires COVID-19 testing prior to certain scheduled hospital procedures.  Please go to [place]. [place], [place] [address] on 04/16/21 at 11:20 am.  This is a drive up testing site.  You will not need to exit your vehicle.  You must agree to self-quarantine from the time of your testing until the procedure date on 04/18/21.  This should included staying home with ONLY the people you live with.  Avoid take-out, grocery store shopping or leaving the house for any non-emergent reason.  Failure to have your COVID-19 test done on the date and time you have been scheduled will result in cancellation of your procedure.  Please call our office at [phone] if you have any questions.   4. Medication instructions in preparation for your procedure:   Contrast Allergy: No   No hctz or Lasix on day of procedure No full strength aspirin (325 mg) on day of procedure.  Take aspirin 81 mg that day  Do not take Diabetes Med Glucophage (Metformin) on the day of the procedure and HOLD [place]. (Restart on Saturday 04/21/21)  On the morning of your procedure, take your Aspirin 81 mg  and any morning medicines NOT listed above.  You may use sips of water.  5. Plan for one night stay--bring personal belongings. 6. Bring a current list of your medications and current insurance cards. 7. You MUST have a responsible person to drive you home. 8. Someone MUST be  with you the first 24 hours after you arrive home or your discharge will be delayed. 9. Please wear clothes that are easy to get on and off and wear slip-on shoes.  Thank you for allowing [place] to care for you!   -- [facility]

## 2021-04-05 LAB — BASIC METABOLIC PANEL
BUN/Creatinine Ratio: 14 (ref 10–24)
BUN: 11 mg/dL (ref 8–27)
CO2: 24 mmol/L (ref 20–29)
Calcium: 8.6 mg/dL (ref 8.6–10.2)
Chloride: 101 mmol/L (ref 96–106)
Creatinine, Ser: 0.78 mg/dL (ref 0.76–1.27)
Glucose: 169 mg/dL — ABNORMAL HIGH (ref 65–99)
Potassium: 5.3 mmol/L — ABNORMAL HIGH (ref 3.5–5.2)
Sodium: 138 mmol/L (ref 134–144)
eGFR: 94 mL/min/{1.73_m2} (ref >59)

## 2021-04-05 LAB — CBC
Hematocrit: 40.9 % (ref 37.5–51.0)
Hemoglobin: 13.7 g/dL (ref 13.0–17.7)
MCH: 32.2 pg (ref 26.6–33.0)
MCHC: 33.5 g/dL (ref 31.5–35.7)
MCV: 96 fL (ref 79–97)
Platelets: 272 10*3/uL (ref 150–450)
RBC: 4.25 x10E6/uL (ref 4.14–5.80)
RDW: 12.2 % (ref 11.6–15.4)
WBC: 8.5 10*3/uL (ref 3.4–10.8)

## 2021-04-16 ENCOUNTER — Other Ambulatory Visit (HOSPITAL_COMMUNITY)
Admission: RE | Admit: 2021-04-16 | Discharge: 2021-04-16 | Disposition: A | Discharge status: Home / Self Care | Payer: Medicare Other | Source: Ambulatory Visit | Attending: Cardiovascular Disease | Admitting: Cardiovascular Disease

## 2021-04-16 DIAGNOSIS — Z01812 Encounter for preprocedural laboratory examination: Secondary | ICD-10-CM | POA: Diagnosis present

## 2021-04-16 DIAGNOSIS — Z20822 Contact with and (suspected) exposure to covid-19: Secondary | ICD-10-CM | POA: Diagnosis not present

## 2021-04-16 LAB — SARS CORONAVIRUS 2 (TAT 6-24 HRS): SARS Coronavirus 2: NEGATIVE

## 2021-04-17 ENCOUNTER — Other Ambulatory Visit (HOSPITAL_COMMUNITY): Payer: Medicare Other | Admitting: Dentistry

## 2021-04-17 NOTE — Pre-Procedure Instructions (Signed)
Attempted to call patient regarding discharge instructions for tomorrows procedure.  Left voice mail on the following items.   Nothing to eat or drink after midnight. Ok to take am medications with the exception of HCTZ and Glucophage.  You need responsible adult to drive you home tomorrow as well as stay overnight with you.  Arrival time is 9:30

## 2021-04-18 ENCOUNTER — Encounter (HOSPITAL_COMMUNITY)
Admission: RE | Disposition: A | Discharge status: Home / Self Care | Payer: Self-pay | Source: Home / Self Care | Attending: Cardiovascular Disease

## 2021-04-18 ENCOUNTER — Other Ambulatory Visit: Payer: Self-pay

## 2021-04-18 ENCOUNTER — Ambulatory Visit (HOSPITAL_COMMUNITY)
Admission: RE | Admit: 2021-04-18 | Discharge: 2021-04-18 | Disposition: A | Discharge status: Home / Self Care | Payer: Medicare Other | Attending: Cardiovascular Disease | Admitting: Cardiovascular Disease

## 2021-04-18 DIAGNOSIS — I350 Nonrheumatic aortic (valve) stenosis: Secondary | ICD-10-CM | POA: Diagnosis not present

## 2021-04-18 DIAGNOSIS — J449 Chronic obstructive pulmonary disease, unspecified: Secondary | ICD-10-CM | POA: Insufficient documentation

## 2021-04-18 DIAGNOSIS — Z79899 Other long term (current) drug therapy: Secondary | ICD-10-CM | POA: Diagnosis not present

## 2021-04-18 DIAGNOSIS — Z7982 Long term (current) use of aspirin: Secondary | ICD-10-CM | POA: Diagnosis not present

## 2021-04-18 DIAGNOSIS — E1151 Type 2 diabetes mellitus with diabetic peripheral angiopathy without gangrene: Secondary | ICD-10-CM | POA: Insufficient documentation

## 2021-04-18 DIAGNOSIS — I10 Essential (primary) hypertension: Secondary | ICD-10-CM | POA: Insufficient documentation

## 2021-04-18 DIAGNOSIS — I352 Nonrheumatic aortic (valve) stenosis with insufficiency: Secondary | ICD-10-CM | POA: Insufficient documentation

## 2021-04-18 DIAGNOSIS — F17210 Nicotine dependence, cigarettes, uncomplicated: Secondary | ICD-10-CM | POA: Insufficient documentation

## 2021-04-18 DIAGNOSIS — Z7984 Long term (current) use of oral hypoglycemic drugs: Secondary | ICD-10-CM | POA: Insufficient documentation

## 2021-04-18 HISTORY — PX: RIGHT/LEFT HEART CATH AND CORONARY ANGIOGRAPHY: CATH118266

## 2021-04-18 LAB — POCT I-STAT EG7
Acid-base deficit: 2 mmol/L (ref 0.0–2.0)
Bicarbonate: 26.6 mmol/L (ref 20.0–28.0)
Calcium, Ion: 0.95 mmol/L — ABNORMAL LOW (ref 1.15–1.40)
HCT: 34 % — ABNORMAL LOW (ref 39.0–52.0)
Hemoglobin: 11.6 g/dL — ABNORMAL LOW (ref 13.0–17.0)
O2 Saturation: 74 %
Potassium: 3.8 mmol/L (ref 3.5–5.1)
Sodium: 144 mmol/L (ref 135–145)
TCO2: 29 mmol/L (ref 22–32)
pCO2, Ven: 65.8 mmHg — ABNORMAL HIGH (ref 44.0–60.0)
pH, Ven: 7.215 — ABNORMAL LOW (ref 7.250–7.430)
pO2, Ven: 49 mmHg — ABNORMAL HIGH (ref 32.0–45.0)

## 2021-04-18 LAB — GLUCOSE, CAPILLARY
Glucose-Capillary: 144 mg/dL — ABNORMAL HIGH (ref 70–99)
Glucose-Capillary: 116 mg/dL — ABNORMAL HIGH (ref 70–99)

## 2021-04-18 LAB — POCT I-STAT 7, (LYTES, BLD GAS, ICA,H+H)
Acid-Base Excess: 0 mmol/L (ref 0.0–2.0)
Bicarbonate: 28.7 mmol/L — ABNORMAL HIGH (ref 20.0–28.0)
Calcium, Ion: 1.2 mmol/L (ref 1.15–1.40)
HCT: 38 % — ABNORMAL LOW (ref 39.0–52.0)
Hemoglobin: 12.9 g/dL — ABNORMAL LOW (ref 13.0–17.0)
O2 Saturation: 99 %
Potassium: 4.5 mmol/L (ref 3.5–5.1)
Sodium: 139 mmol/L (ref 135–145)
TCO2: 31 mmol/L (ref 22–32)
pCO2 arterial: 66 mmHg (ref 32.0–48.0)
pH, Arterial: 7.246 — ABNORMAL LOW (ref 7.350–7.450)
pO2, Arterial: 141 mmHg — ABNORMAL HIGH (ref 83.0–108.0)

## 2021-04-18 SURGERY — RIGHT/LEFT HEART CATH AND CORONARY ANGIOGRAPHY
Anesthesia: LOCAL

## 2021-04-18 MED ORDER — LIDOCAINE HCL (PF) 1 % IJ SOLN
INTRAMUSCULAR | Status: DC | PRN
Start: 2021-04-18 — End: 2021-04-18
  Administered 2021-04-18: 4 mL

## 2021-04-18 MED ORDER — HYDRALAZINE HCL 20 MG/ML IJ SOLN: 10.0000 mg | INTRAMUSCULAR | Status: DC | PRN

## 2021-04-18 MED ORDER — FENTANYL CITRATE (PF) 100 MCG/2ML IJ SOLN
INTRAMUSCULAR | Status: AC
  Filled 2021-04-18: qty 2

## 2021-04-18 MED ORDER — ASPIRIN 81 MG PO CHEW: 81.0000 mg | CHEWABLE_TABLET | ORAL | Status: DC

## 2021-04-18 MED ORDER — VERAPAMIL HCL 2.5 MG/ML IV SOLN
INTRAVENOUS | Status: DC | PRN
  Administered 2021-04-18: 10 mL via INTRA_ARTERIAL

## 2021-04-18 MED ORDER — LIDOCAINE HCL (PF) 1 % IJ SOLN
INTRAMUSCULAR | Status: AC
  Filled 2021-04-18: qty 30

## 2021-04-18 MED ORDER — SODIUM CHLORIDE 0.9 % IV SOLN: 250.0000 mL | INTRAVENOUS | Status: DC | PRN

## 2021-04-18 MED ORDER — SODIUM CHLORIDE 0.9% FLUSH: 3.0000 mL | INTRAVENOUS | Status: DC | PRN

## 2021-04-18 MED ORDER — SODIUM CHLORIDE 0.9 % WEIGHT BASED INFUSION
3.0000 mL/kg/h | INTRAVENOUS | Status: AC
  Administered 2021-04-18: 3 mL/kg/h via INTRAVENOUS

## 2021-04-18 MED ORDER — SODIUM CHLORIDE 0.9% FLUSH: 3.0000 mL | Freq: Two times a day (BID) | INTRAVENOUS | Status: DC

## 2021-04-18 MED ORDER — HEPARIN (PORCINE) IN NACL 1000-0.9 UT/500ML-% IV SOLN
INTRAVENOUS | Status: AC
  Filled 2021-04-18: qty 1000

## 2021-04-18 MED ORDER — VERAPAMIL HCL 2.5 MG/ML IV SOLN
INTRAVENOUS | Status: AC
  Filled 2021-04-18: qty 2

## 2021-04-18 MED ORDER — LABETALOL HCL 5 MG/ML IV SOLN: 10.0000 mg | INTRAVENOUS | Status: DC | PRN

## 2021-04-18 MED ORDER — HEPARIN SODIUM (PORCINE) 1000 UNIT/ML IJ SOLN
INTRAMUSCULAR | Status: DC | PRN
  Administered 2021-04-18: 3500 [IU] via INTRAVENOUS

## 2021-04-18 MED ORDER — ACETAMINOPHEN 325 MG PO TABS: 650.0000 mg | ORAL_TABLET | ORAL | Status: DC | PRN

## 2021-04-18 MED ORDER — SODIUM CHLORIDE 0.9 % IV SOLN: INTRAVENOUS | Status: AC

## 2021-04-18 MED ORDER — MIDAZOLAM HCL 2 MG/2ML IJ SOLN
INTRAMUSCULAR | Status: AC
  Filled 2021-04-18: qty 2

## 2021-04-18 MED ORDER — HEPARIN SODIUM (PORCINE) 1000 UNIT/ML IJ SOLN
INTRAMUSCULAR | Status: AC
  Filled 2021-04-18: qty 1

## 2021-04-18 MED ORDER — SODIUM CHLORIDE 0.9 % WEIGHT BASED INFUSION: 1.0000 mL/kg/h | INTRAVENOUS | Status: DC

## 2021-04-18 MED ORDER — MIDAZOLAM HCL 2 MG/2ML IJ SOLN
INTRAMUSCULAR | Status: DC | PRN
  Administered 2021-04-18: 2 mg via INTRAVENOUS

## 2021-04-18 MED ORDER — HEPARIN (PORCINE) IN NACL 1000-0.9 UT/500ML-% IV SOLN
INTRAVENOUS | Status: DC | PRN
Start: 2021-04-18 — End: 2021-04-18
  Administered 2021-04-18 (×2): 500 mL

## 2021-04-18 MED ORDER — IOHEXOL 350 MG/ML SOLN
INTRAVENOUS | Status: DC | PRN
  Administered 2021-04-18: 55 mL

## 2021-04-18 MED ORDER — FENTANYL CITRATE (PF) 100 MCG/2ML IJ SOLN
INTRAMUSCULAR | Status: DC | PRN
  Administered 2021-04-18: 50 ug via INTRAVENOUS

## 2021-04-18 MED ORDER — ONDANSETRON HCL 4 MG/2ML IJ SOLN: 4.0000 mg | Freq: Four times a day (QID) | INTRAMUSCULAR | Status: DC | PRN

## 2021-04-18 SURGICAL SUPPLY — 14 items
CATH 5FR JL3.5 JR4 ANG PIG MP (CATHETERS) ×2 IMPLANT
CATH INFINITI 5FR AL1 (CATHETERS) ×4 IMPLANT
CATH SWAN GANZ 7F STRAIGHT (CATHETERS) ×2 IMPLANT
DEVICE RAD COMP TR BAND LRG (VASCULAR PRODUCTS) ×2 IMPLANT
GLIDESHEATH SLEND SS 6F .021 (SHEATH) ×2 IMPLANT
GUIDEWIRE .025 260CM (WIRE) ×2 IMPLANT
GUIDEWIRE INQWIRE 1.5J.035X260 (WIRE) ×1 IMPLANT
INQWIRE 1.5J .035X260CM (WIRE) ×2
KIT HEART LEFT (KITS) ×2 IMPLANT
PACK CARDIAC CATHETERIZATION (CUSTOM PROCEDURE TRAY) ×2 IMPLANT
TRANSDUCER W/STOPCOCK (MISCELLANEOUS) ×2 IMPLANT
TUBING CIL FLEX 10 FLL-RA (TUBING) ×2 IMPLANT
WIRE EMERALD ST .035X150CM (WIRE) ×2 IMPLANT
WIRE HI TORQ VERSACORE-J 145CM (WIRE) ×2 IMPLANT

## 2021-04-18 NOTE — Interval H&P Note (Signed)
History and Physical Interval Note:  04/18/2021 11:17 AM  Patrick Beck  has presented today for surgery, with the diagnosis of aortic stenosis.  The various methods of treatment have been discussed with the patient and family. After consideration of risks, benefits and other options for treatment, the patient has consented to  Procedure(s): RIGHT/LEFT HEART CATH AND CORONARY ANGIOGRAPHY (N/A) as a surgical intervention.  The patient's history has been reviewed, patient examined, no change in status, stable for surgery.  I have reviewed the patient's chart and labs.  Questions were answered to the patient's satisfaction.    Cath Lab Visit (complete for each Cath Lab visit)  Clinical Evaluation Leading to the Procedure:   ACS: No.  Non-ACS:    Anginal Classification: CCS II  Anti-ischemic medical therapy: No Therapy  Non-Invasive Test Results: No non-invasive testing performed  Prior CABG: No previous CABG        [name]

## 2021-04-18 NOTE — Progress Notes (Signed)
Ambulated in hallway tol  Well no bleeding noted before or after ambulation

## 2021-04-18 NOTE — Discharge Instructions (Signed)
Radial Site Care  This sheet gives you information about how to care for yourself after your procedure. Your health care provider may also give you more specific instructions. If you have problems or questions, contact your health care provider. What can I expect after the procedure? After the procedure, it is common to have:  Bruising and tenderness at the catheter insertion area. Follow these instructions at home: Medicines  Take over-the-counter and prescription medicines only as told by your health care provider. Insertion site care  Follow instructions from your health care provider about how to take care of your insertion site. Make sure you: ? Wash your hands with soap and water before you change your bandage (dressing). If soap and water are not available, use hand sanitizer. ? Change your dressing as told by your health care provider. ? Leave stitches (sutures), skin glue, or adhesive strips in place. These skin closures may need to stay in place for 2 weeks or longer. If adhesive strip edges start to loosen and curl up, you may trim the loose edges. Do not remove adhesive strips completely unless your health care provider tells you to do that.  Check your insertion site every day for signs of infection. Check for: ? Redness, swelling, or pain. ? Fluid or blood. ? Pus or a bad smell. ? Warmth.  Do not take baths, swim, or use a hot tub until your health care provider approves.  You may shower 24-48 hours after the procedure, or as directed by your health care provider. ? Remove the dressing and gently wash the site with plain soap and water. ? Pat the area dry with a clean towel. ? Do not rub the site. That could cause bleeding.  Do not apply powder or lotion to the site. Activity  For 24 hours after the procedure, or as directed by your health care provider: ? Do not flex or bend the affected arm. ? Do not push or pull heavy objects with the affected arm. ? Do not drive  yourself home from the hospital or clinic. You may drive 24 hours after the procedure unless your health care provider tells you not to. ? Do not operate machinery or power tools.  Do not lift anything that is heavier than 10 lb (4.5 kg), or the limit that you are told, until your health care provider says that it is safe.  Ask your health care provider when it is okay to: ? Return to work or school. ? Resume usual physical activities or sports. ? Resume sexual activity.   General instructions  If the catheter site starts to bleed, raise your arm and put firm pressure on the site. If the bleeding does not stop, get help right away. This is a medical emergency.  If you went home on the same day as your procedure, a responsible adult should be with you for the first 24 hours after you arrive home.  Keep all follow-up visits as told by your health care provider. This is important. Contact a health care provider if:  You have a fever.  You have redness, swelling, or yellow drainage around your insertion site. Get help right away if:  You have unusual pain at the radial site.  The catheter insertion area swells very fast.  The insertion area is bleeding, and the bleeding does not stop when you hold steady pressure on the area.  Your arm or hand becomes pale, cool, tingly, or numb. These symptoms may represent a serious   problem that is an emergency. Do not wait to see if the symptoms will go away. Get medical help right away. Call your local emergency services (911 in the [place] Do not drive yourself to the hospital. Summary  After the procedure, it is common to have bruising and tenderness at the site.  Follow instructions from your health care provider about how to take care of your radial site wound. Check the wound every day for signs of infection.  Do not lift anything that is heavier than 10 lb (4.5 kg), or the limit that you are told, until your health care provider says that it  is safe. This information is not intended to replace advice given to you by your health care provider. Make sure you discuss any questions you have with your health care provider. Document Revised: 01/21/2018 Document Reviewed: 01/21/2018 Elsevier Patient Education  2021 [employer]vier Inc.  

## 2021-04-18 NOTE — Progress Notes (Signed)
Discharge instructions reviewed with pt and his partner both voice understanding.

## 2021-04-19 ENCOUNTER — Encounter (HOSPITAL_COMMUNITY): Payer: Self-pay | Admitting: Cardiovascular Disease

## 2021-04-23 ENCOUNTER — Ambulatory Visit (INDEPENDENT_AMBULATORY_CARE_PROVIDER_SITE_OTHER): Payer: Medicare Other | Admitting: Dentistry

## 2021-04-23 ENCOUNTER — Other Ambulatory Visit: Payer: Self-pay

## 2021-04-23 VITALS — BP 161/90 | HR 64 | Temp 98.6°F

## 2021-04-23 DIAGNOSIS — K0889 Other specified disorders of teeth and supporting structures: Secondary | ICD-10-CM

## 2021-04-23 DIAGNOSIS — K0530 Chronic periodontitis, unspecified: Secondary | ICD-10-CM

## 2021-04-23 DIAGNOSIS — K083 Retained dental root: Secondary | ICD-10-CM

## 2021-04-23 DIAGNOSIS — K036 Deposits [accretions] on teeth: Secondary | ICD-10-CM

## 2021-04-23 DIAGNOSIS — Z01818 Encounter for other preprocedural examination: Secondary | ICD-10-CM

## 2021-04-23 DIAGNOSIS — K045 Chronic apical periodontitis: Secondary | ICD-10-CM

## 2021-04-23 DIAGNOSIS — I350 Nonrheumatic aortic (valve) stenosis: Secondary | ICD-10-CM

## 2021-04-23 DIAGNOSIS — K029 Dental caries, unspecified: Secondary | ICD-10-CM

## 2021-04-23 DIAGNOSIS — K08109 Complete loss of teeth, unspecified cause, unspecified class: Secondary | ICD-10-CM

## 2021-04-23 DIAGNOSIS — K0850 Unsatisfactory restoration of tooth, unspecified: Secondary | ICD-10-CM

## 2021-04-23 NOTE — Patient Instructions (Signed)
[facility] Kaylub Detienne B. Owen Pagnotta, D.M.D. Phone: [phone] Fax: [phone]   It was a pleasure seeing you today!  Please refer to the information below regarding your dental visit, and call us should you have any questions or concerns that may come up after you leave.   Thank you for giving us the opportunity to provide care for you.  If there is anything we can do for you, please let us know.    HEART VALVES AND MOUTH CARE   FACTS:  If you have any infection in your mouth, it can infect your heart valve.  If you heart valve is infected, you will be seriously ill.  Infections in the mouth can be SILENT and do not always cause pain.  Examples of infections in the mouth are gum disease, dental cavities, and abscesses.  Some possible signs of infection are: Bad breath, bleeding gums, or teeth that are sensitive to sweets, hot, and/or cold. There are many other signs as well.   WHAT YOU HAVE TO DO:  Brush your teeth after meals and at bedtime.  Spend at least 2 minutes brushing well, especially behind your back teeth and all around your teeth that stand alone.  Brush at the gumline also.  Do not go to bed without brushing your teeth and flossing.  If your gums bleed when you brush or floss, do NOT stop brushing or flossing.  Bleeding can be a sign of inflammation or irritation from bacteria.  It usually means that your gums need more attention and better cleaning.   If your dentist or Dr. Sheppard Luckenbach gave you a prescription mouthwash to use, make sure to use it as directed. If you run out of the medication, get a refill at the pharmacy.  If you were given any other medications or directions by your dentist, please follow them.  If you did not understand the directions or forget what you were told, please call.  We will be happy to refresh your memory.  If you need antibiotics before dental procedures, make sure you take them one hour prior to every  dental visit as directed.   Get a dental check-up every 4-6 months in order to keep your mouth healthy, or to find and treat any new infection. You will most likely need your teeth cleaned or gums treated at the same time.  If you are not able to come in for your scheduled appointment, call your dentist as soon as possible to reschedule.  If you have a problem in between dental visits, call your dentist.   QUESTIONS? . Call our office during office hours [phone].    WE ARE A TEAM.  OUR GOAL IS:  HEALTHY MOUTH, HEALTHY HEART 

## 2021-04-23 NOTE — Progress Notes (Signed)
Department of Dental Medicine     OUTPATIENT CONSULTATION  Service Date:   04/23/2021  Patient Name:  Patrick Beck Date of Birth:   [birth year] Medical Record Number: [id number]  Referring Provider:              Darlina Guys, MD   PLAN & RECOMMENDATIONS   > There are no current signs of acute dental infection including abscess, edema or erythema, or suspicious lesion requiring biopsy.  The patient does have severe caries, retained root tips, severe periodontal disease and several teeth with chronic apical infections. >> Recommend full mouth extractions to decrease the risk of perioperative and postoperative systemic infection and/or other complications in the operating room under general anesthesia.   >>> Plan to discuss with medical team and coordinate treatment as needed.  >>  Recommend the patient establish care at a dental office of their choice for routine dental care including replacement of missing teeth and exams. >>  Discussed in detail all treatment options with the patient and they are agreeable to the plan.   Thank you for consulting with [facility] and for the opportunity to participate in this patient's treatment.  Should you have any questions or concerns, please contact the [facility] at [phone].  04/23/2021      CONSULT NOTE   COVID 19 SCREENING: The patient denies symptoms concerning for COVID-19 infection including fever, chills, cough, or newly developed shortness of breath.   HISTORY OF PRESENT ILLNESS: >> Patrick Beck is a very pleasant 75 y.o. male with h/o carotid artery disease, diverticulitis, HTN, Type 2 DM, GERD, peripheral artery disease who was recently diagnosed with severe aortic stenosis and is anticipating TAVR.  The patient presents today for a medically necessary dental consultation as part of their pre-cardiac surgery work-up.  DENTAL HISTORY: > The patient reports that it has been around 30 years since he has last seen  a dentist.  He reports knowing that the last time he went he was told he had several cavities around his old fillings but due to the expense of replacing all of the restorations he stopped going. He says sometimes he gets food trapped in some of his broken down teeth, but currently denies any dental/orofacial pain or sensitivity. >> Patient is able to manage oral secretions.  Patient denies dysphagia, odynophagia, dysphonia, SOB and neck pain.  Patient denies fever, rigors and malaise.   CHIEF COMPLAINT: Here for a preoperative dental evaluation.   Patient Active Problem List   Diagnosis Date Noted  . Nonrheumatic aortic valve stenosis   . Cavitary pneumonia 10/22/2017  . COPD (chronic obstructive pulmonary disease) ([place]) 10/22/2017  . Tobacco dependence 10/22/2017  . Type 2 diabetes mellitus without complication ([place]) 09/38/1829  . Essential hypertension 10/22/2017  . Glaucoma 10/22/2017   Past Medical History:  Diagnosis Date  . Aortic atherosclerosis ([place])   . Aortic stenosis   . Carotid artery disease ([place])   . COPD (chronic obstructive pulmonary disease) ([place])   . Diverticulosis   . Erectile dysfunction   . Essential hypertension   . GERD (gastroesophageal reflux disease)   . Glaucoma   . H/O hiatal hernia   . PAD (peripheral artery disease) ([place])   . Type 2 diabetes mellitus ([place])    Past Surgical History:  Procedure Laterality Date  . COLONOSCOPY N/A 04/06/2013   Procedure: COLONOSCOPY;  Surgeon: Jamesetta So, MD;  Location: [facility];  Service: Gastroenterology;  Laterality: N/A;  . HERNIA REPAIR  Three prior surgeries before 1966  . RIGHT/LEFT HEART CATH AND CORONARY ANGIOGRAPHY N/A 04/18/2021   Procedure: RIGHT/LEFT HEART CATH AND CORONARY ANGIOGRAPHY;  Surgeon: Burnell Blanks, MD;  Location: [facility];  Service: Cardiovascular;  Laterality: N/A;   Allergies  Allergen Reactions  . Macadamia Nut Oil Anaphylaxis    Throat swelling and can't  breathe   Current Outpatient Medications  Medication Sig Dispense Refill  . aspirin 325 MG EC tablet Take 325 mg by mouth daily.    . brimonidine (ALPHAGAN) 0.2 % ophthalmic solution Place 1 drop into both eyes 3 (three) times daily.    . diphenhydrAMINE (BENADRYL) 25 MG tablet Take 25 mg by mouth daily as needed for allergies.    Marland Kitchen esomeprazole (NEXIUM) 20 MG capsule Take 20 mg by mouth daily at 12 noon.    . furosemide (LASIX) 40 MG tablet Take 40 mg by mouth every other day.    . hydrochlorothiazide (HYDRODIURIL) 12.5 MG tablet Take 12.5 mg by mouth daily.    Marland Kitchen ibuprofen (ADVIL,MOTRIN) 200 MG tablet Take 800 mg by mouth every 6 (six) hours as needed for mild pain.    Marland Kitchen latanoprost (XALATAN) 0.005 % ophthalmic solution Place 1 drop into both eyes at bedtime.    . Melatonin 10 MG CAPS Take 40 mg by mouth at bedtime.    . metFORMIN (GLUCOPHAGE) 1000 MG tablet Take 1,000 mg by mouth 2 (two) times daily with a meal.    . neomycin-bacitracin-polymyxin (NEOSPORIN) ointment Apply 1 application topically as needed for wound care.    . pravastatin (PRAVACHOL) 40 MG tablet Take 40 mg by mouth daily.    . timolol (BETIMOL) 0.5 % ophthalmic solution Place 1 drop into both eyes daily.     No current facility-administered medications for this visit.    LABS: Lab Results  Component Value Date   WBC 8.5 04/04/2021   HGB 12.9 (L) 04/18/2021   HCT 38.0 (L) 04/18/2021   MCV 96 04/04/2021   PLT 272 04/04/2021      Component Value Date/Time   NA 139 04/18/2021 1142   NA 138 04/04/2021 1233   K 4.5 04/18/2021 1142   CL 101 04/04/2021 1233   CO2 24 04/04/2021 1233   GLUCOSE 169 (H) 04/04/2021 1233   GLUCOSE 154 (H) 10/24/2017 0450   BUN 11 04/04/2021 1233   CREATININE 0.78 04/04/2021 1233   CALCIUM 8.6 04/04/2021 1233   GFRNONAA >60 10/24/2017 0450   GFRAA >60 10/24/2017 0450   No results found for: INR, PROTIME No results found for: PTT  Social History   Socioeconomic History  .  Marital status: Divorced    Spouse name: Not on file  . Number of children: 0  . Years of education: Not on file  . Highest education level: Not on file  Occupational History  . 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  Tobacco Use  . Smoking status: Heavy Tobacco Smoker    Packs/day: 2.00    Years: 56.00    Pack years: 112.00    Types: Cigarettes  . Smokeless tobacco: Never Used  . Tobacco comment: pt refuses hand outs   Substance and Sexual Activity  . Alcohol use: No  . Drug use: No  . Sexual activity: Not on file  Other Topics Concern  . Not on file  Social History Narrative  . Not on file   Social Determinants of Health   Financial Resource Strain: Not on file  Food Insecurity: Not on  file  Transportation Needs: Not on file  Physical Activity: Not on file  Stress: Not on file  Social Connections: Not on file  Intimate Partner Violence: Not on file   Family History  Problem Relation Age of Onset  . Aortic stenosis Father   . COPD Mother   . Cancer Neg Hx     REVIEW OF SYSTEMS: Reviewed with the patient as per HPI. PSYCH: Patient denies having dental phobia.   VITAL SIGNS: BP (!) 161/90 (BP Location: Right Arm)   Pulse 64   Temp 98.6 F (37 C) (Oral)    PHYSICAL EXAM: >> General:  Well-developed, comfortable and in no apparent distress. >> Neurological:  Alert and oriented to person, place and  time. >> Extraoral:  Facial symmetry present without any edema or erythema.  No swelling or lymphadenopathy.  TMJ asymptomatic without clicks or crepitations. >> Intraoral:  Soft tissues appear well-perfused and mucous membranes moist.  FOM and vestibules soft and not raised. Oral cavity without mass or lesion. No signs of infection, parulis, sinus tract, edema or erythema evident upon exam.   DENTAL EXAM: Hard tissue exam completed and charted.  >> Dentition:  Overall poor remaining dentition.  Missing teeth, rampant caries, retained root tips, existing  restorations. >> The patient is maintaining poor oral hygiene.  >> Periodontal: Inflamed and erythematous gingival tissue. Generalized plaque and calculus accumulation.  #6 and #11 Class II mobility. >> Caries: Rampant decay in all 4 quadrants >> Retained Root Tips: #2, #5, #7, #8, #9, #10, #12, #17-#23 >> Defective Restorations: #3 existing amalgam restoration with severe recurrent decay >> Removable/Fixed Prosthodontics: Patient denies wearing partial dentures. >> Occlusion: Unable to assess molar occlusion.  Non-functional and supra-erupted teeth #3, #11, #24, #26, #28 and #29.   RADIOGRAPHIC EXAM: PAN and Full Mouth Series exposed and interpreted.  >> Condyles seated bilaterally in fossas.  No evidence of abnormal pathology.  All visualized osseous structures appear WNL. #3 and #24 mesially drifting.  >> Generalized severe horizontal bone loss consistent with severe periodontitis. Radiographic calculus accumulation evident on remaining dentition.   >> Missing teeth, severe caries in all 4 quadrants- #3, #6, #11, #24 and #27 deep decay approximating the pulp. Multiple retained root tips. #2, #5, #7, #8, #9, #10, #12, #17, #18, #19, #20, #21, #22 and #23 have periapical radiolucencies.    ASSESSMENT:  1. Severe aortic stenosis 2. Preoperative dental consultation 3. Missing teeth 4. Caries 5. Retained root tips 6. Chronic apical periodontitis 7. Accretions on teeth 8. Chronic periodontitis 9. Loose teeth 10. Defective dental restoration   PLAN AND RECOMMENDATIONS: >  I discussed the risks, benefits, and complications of various scenarios with the patient in relationship to their medical and dental conditions, which included systemic infection such as endocarditis, bacteremia or other serious issues that could potentially occur either before, during or after their anticipated heart surgery  if dental/oral concerns are not addressed.  I explained that if any chronic or acute  dental/oral infection(s) are addressed and subsequently not maintained following medical optimization and recovery, their risk of the previously mentioned complications are just as high and could potentially occur postoperatively.  I explained all significant findings of the dental consultation with the patient including all remaining teeth with gross decay, mobility/periodontal disease and chronic infections and the recommended care including extractions of all remaining teeth in order to optimize them for heart surgery from a dental standpoint.  The patient verbalized understanding of all findings, discussion, and recommendations. >>  We then discussed various treatment options to include no treatment, multiple extractions with alveoloplasty, pre-prosthetic surgery as indicated, periodontal therapy, dental restorations, root canal therapy, crown and bridge therapy, implant therapy, and replacement of missing teeth as indicated.  The patient verbalized understanding of all options, and currently wishes to proceed with full mouth extractions with alveoloplasty as needed.  Recommend completing all treatment in the operating room under GA due to the amount of treatment needed to prepare the patient for cardiac surgery and his current medical condition. >>>  Plan to discuss all findings and recommendations with medical team and coordinate future care as needed.  <> The patient tolerated today's visit well.  All questions and concerns were addressed and answered, and the patient departed in stable condition.   I spent in excess of 120 minutes during the conduct of this consultation and >50% of this time involved direct face-to-face encounter for counseling and/or coordination of the patient's care. Gem Benson [place], D.M.D.

## 2021-04-25 ENCOUNTER — Other Ambulatory Visit (HOSPITAL_COMMUNITY)
Admission: RE | Admit: 2021-04-25 | Discharge: 2021-04-25 | Disposition: A | Discharge status: Home / Self Care | Payer: Medicare Other | Source: Ambulatory Visit | Attending: Cardiovascular Disease | Admitting: Cardiovascular Disease

## 2021-04-25 DIAGNOSIS — Z20822 Contact with and (suspected) exposure to covid-19: Secondary | ICD-10-CM | POA: Insufficient documentation

## 2021-04-25 DIAGNOSIS — Z01812 Encounter for preprocedural laboratory examination: Secondary | ICD-10-CM | POA: Diagnosis present

## 2021-04-25 LAB — SARS CORONAVIRUS 2 (TAT 6-24 HRS): SARS Coronavirus 2: NEGATIVE

## 2021-04-27 ENCOUNTER — Ambulatory Visit (HOSPITAL_COMMUNITY)
Admission: RE | Admit: 2021-04-27 | Discharge: 2021-04-27 | Disposition: A | Discharge status: Home / Self Care | Payer: Medicare Other | Source: Ambulatory Visit | Attending: Cardiovascular Disease | Admitting: Cardiovascular Disease

## 2021-04-27 ENCOUNTER — Ambulatory Visit (HOSPITAL_COMMUNITY): Admit: 2021-04-27 | Payer: Medicare Other

## 2021-04-27 ENCOUNTER — Other Ambulatory Visit: Payer: Self-pay

## 2021-04-27 DIAGNOSIS — I350 Nonrheumatic aortic (valve) stenosis: Secondary | ICD-10-CM

## 2021-04-27 DIAGNOSIS — I10 Essential (primary) hypertension: Secondary | ICD-10-CM | POA: Diagnosis not present

## 2021-04-27 DIAGNOSIS — E1151 Type 2 diabetes mellitus with diabetic peripheral angiopathy without gangrene: Secondary | ICD-10-CM | POA: Insufficient documentation

## 2021-04-27 DIAGNOSIS — I2510 Atherosclerotic heart disease of native coronary artery without angina pectoris: Secondary | ICD-10-CM | POA: Diagnosis not present

## 2021-04-27 DIAGNOSIS — F17210 Nicotine dependence, cigarettes, uncomplicated: Secondary | ICD-10-CM | POA: Insufficient documentation

## 2021-04-27 DIAGNOSIS — I714 Abdominal aortic aneurysm, without rupture: Secondary | ICD-10-CM | POA: Diagnosis not present

## 2021-04-27 DIAGNOSIS — I700 Atherosclerosis of aorta: Secondary | ICD-10-CM | POA: Insufficient documentation

## 2021-04-27 DIAGNOSIS — J432 Centrilobular emphysema: Secondary | ICD-10-CM | POA: Insufficient documentation

## 2021-04-27 DIAGNOSIS — E119 Type 2 diabetes mellitus without complications: Secondary | ICD-10-CM | POA: Diagnosis not present

## 2021-04-27 DIAGNOSIS — Z01818 Encounter for other preprocedural examination: Secondary | ICD-10-CM | POA: Insufficient documentation

## 2021-04-27 LAB — PULMONARY FUNCTION TEST
DL/VA % pred: 53 %
DL/VA: 2.12 ml/min/mmHg/L
DLCO unc % pred: 46 %
DLCO unc: 13.02 ml/min/mmHg
FEF 25-75 Post: 0.44 L/sec
FEF 25-75 Pre: 0.55 L/sec
FEF2575-%Change-Post: -20 %
FEF2575-%Pred-Post: 16 %
FEF2575-%Pred-Pre: 21 %
FEV1-%Change-Post: -6 %
FEV1-%Pred-Post: 41 %
FEV1-%Pred-Pre: 44 %
FEV1-Post: 1.47 L
FEV1-Pre: 1.58 L
FEV1FVC-%Change-Post: -4 %
FEV1FVC-%Pred-Pre: 58 %
FEV6-%Change-Post: -6 %
FEV6-%Pred-Post: 65 %
FEV6-%Pred-Pre: 69 %
FEV6-Post: 2.97 L
FEV6-Pre: 3.19 L
FEV6FVC-%Change-Post: -1 %
FEV6FVC-%Pred-Post: 90 %
FEV6FVC-%Pred-Pre: 91 %
FVC-%Change-Post: -1 %
FVC-%Pred-Post: 75 %
FVC-%Pred-Pre: 76 %
FVC-Post: 3.63 L
FVC-Pre: 3.69 L
Post FEV1/FVC ratio: 41 %
Post FEV6/FVC ratio: 86 %
Pre FEV1/FVC ratio: 43 %
Pre FEV6/FVC Ratio: 87 %
RV % pred: 170 %
RV: 4.56 L
TLC % pred: 109 %
TLC: 8.34 L

## 2021-04-27 MED ORDER — IOHEXOL 350 MG/ML SOLN
95.0000 mL | Freq: Once | INTRAVENOUS | Status: AC | PRN
  Administered 2021-04-27: 95 mL via INTRAVENOUS

## 2021-04-27 MED ORDER — ALBUTEROL SULFATE (2.5 MG/3ML) 0.083% IN NEBU
2.5000 mg | INHALATION_SOLUTION | Freq: Once | RESPIRATORY_TRACT | Status: AC
  Administered 2021-04-27: 2.5 mg via RESPIRATORY_TRACT

## 2021-04-27 NOTE — Progress Notes (Signed)
Carotid duplex has been completed.  Results can be found under chart review under CV PROC. 04/27/2021 9:22 AM Khalifa Knecht RVT, RDMS

## 2021-05-14 ENCOUNTER — Institutional Professional Consult (permissible substitution): Payer: Medicare Other | Admitting: Thoracic Surgery (Cardiothoracic Vascular Surgery)

## 2021-05-14 ENCOUNTER — Other Ambulatory Visit: Payer: Self-pay

## 2021-05-14 ENCOUNTER — Encounter: Payer: Self-pay | Admitting: Thoracic Surgery (Cardiothoracic Vascular Surgery)

## 2021-05-14 VITALS — BP 132/80 | HR 73 | Temp 98.4°F | Resp 20 | Ht 72.0 in | Wt 140.0 lb

## 2021-05-14 DIAGNOSIS — I350 Nonrheumatic aortic (valve) stenosis: Secondary | ICD-10-CM | POA: Diagnosis not present

## 2021-05-14 NOTE — Progress Notes (Signed)
HEART AND [place] SURGERY CONSULTATION REPORT  Primary Cardiologist is Rozann Lesches, MD PCP is Lemmie Evens, MD  Chief Complaint  Patient presents with  . Aortic Stenosis    Surgical consult for TAVR, review all studies    HPI:  Patient is 75 year old male with history of aortic stenosis, longstanding tobacco abuse with COPD, hypertension, peripheral arterial disease, type 2 diabetes mellitus, and GE reflux disease who has been referred for surgical consultation to discuss treatment options for management of severe symptomatic aortic stenosis.  Patient states that he has known of presence of a heart murmur for many years.  He states that he was in his usual state of health without any physical limitations until fall 2019 when he was hospitalized in [place] for severe necrotizing pneumonia.  He states that he never completely recovered.  Echocardiogram performed at that time reportedly revealed mild aortic stenosis with normal left ventricular systolic function.  The patient was seen in follow-up recently by Dr. Domenic Polite and complaining of worsening symptoms of exertional shortness of breath.  Follow-up echocardiogram performed March 15, 2021 revealed normal left ventricular function with ejection fraction estimated 60 to 65%.  The aortic valve appeared functionally bicuspid with severe calcification and probably severe aortic stenosis.  Peak velocity across aortic valve measured 3.0 m/s but the dimensionless index was notably quite low at 0.23 and aortic valve area calculated 0.72 cm by VTI.  The patient was referred to the multidisciplinary heart valve clinic and has been evaluated previously by Dr. Angelena Form.  Diagnostic cardiac catheterization performed April 18, 2021 revealed mild nonobstructive coronary artery disease normal right-sided pressures and preserved cardiac output.  CT angiography was performed and the patient  referred for surgical consultation.  Patient is single and lives with his close friend in [place].  He has been retired for approximately 10 years.  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  He states that he is never completely recovered from that.  He is now limited by exertional shortness of breath and fatigue.  He gets short of breath with low-level activity.  He denies resting shortness of breath.  He has not had any dizzy spells or syncope.  He has poor energy.  Weight has been stable.  He reports intermittent productive cough and occasional wheezing.  He continues to smoke cigarettes.  He has some difficulty walking because of numbness involving both feet suggestive of diabetic peripheral neuropathy.  However he ambulates without need for any sort of mechanical support or assistance.  Past Medical History:  Diagnosis Date  . Aortic atherosclerosis ([place])   . Aortic stenosis   . Carotid artery disease ([place])   . COPD (chronic obstructive pulmonary disease) ([place])   . Diverticulosis   . Erectile dysfunction   . Essential hypertension   . GERD (gastroesophageal reflux disease)   . Glaucoma   . H/O hiatal hernia   . PAD (peripheral artery disease) ([place])   . Type 2 diabetes mellitus ([place])     Past Surgical History:  Procedure Laterality Date  . COLONOSCOPY N/A 04/06/2013   Procedure: COLONOSCOPY;  Surgeon: Jamesetta So, MD;  Location: [facility];  Service: Gastroenterology;  Laterality: N/A;  . HERNIA REPAIR     Three prior surgeries before 1966  . RIGHT/LEFT HEART CATH AND CORONARY ANGIOGRAPHY N/A 04/18/2021   Procedure: RIGHT/LEFT HEART CATH AND CORONARY  ANGIOGRAPHY;  Surgeon: Burnell Blanks, MD;  Location: [facility];  Service: Cardiovascular;  Laterality: N/A;    Family History  Problem Relation Age of Onset  . Aortic stenosis Father   . COPD Mother   . Cancer Neg Hx      Social History   Socioeconomic History  . Marital status: Divorced    Spouse name: Not on file  . Number of children: 0  . Years of education: Not on file  . Highest education level: Not on file  Occupational History  . 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  Tobacco Use  . Smoking status: Heavy Tobacco Smoker    Packs/day: 2.00    Years: 56.00    Pack years: 112.00    Types: Cigarettes  . Smokeless tobacco: Never Used  . Tobacco comment: pt refuses hand outs   Substance and Sexual Activity  . Alcohol use: No  . Drug use: No  . Sexual activity: Not on file  Other Topics Concern  . Not on file  Social History Narrative  . Not on file   Social Determinants of Health   Financial Resource Strain: Not on file  Food Insecurity: Not on file  Transportation Needs: Not on file  Physical Activity: Not on file  Stress: Not on file  Social Connections: Not on file  Intimate Partner Violence: Not on file    Current Outpatient Medications  Medication Sig Dispense Refill  . aspirin 325 MG EC tablet Take 325 mg by mouth daily.    . brimonidine (ALPHAGAN) 0.2 % ophthalmic solution Place 1 drop into both eyes 3 (three) times daily.    . diphenhydrAMINE (BENADRYL) 25 MG tablet Take 25 mg by mouth daily as needed for allergies.    Marland Kitchen esomeprazole (NEXIUM) 20 MG capsule Take 20 mg by mouth daily at 12 noon.    . furosemide (LASIX) 40 MG tablet Take 40 mg by mouth every other day.    . hydrochlorothiazide (HYDRODIURIL) 12.5 MG tablet Take 12.5 mg by mouth daily.    Marland Kitchen ibuprofen (ADVIL,MOTRIN) 200 MG tablet Take 800 mg by mouth every 6 (six) hours as needed for mild pain.    Marland Kitchen latanoprost (XALATAN) 0.005 % ophthalmic solution Place 1 drop into both eyes at bedtime.    . Melatonin 10 MG CAPS Take 40 mg by mouth at bedtime.    . metFORMIN (GLUCOPHAGE) 1000 MG tablet Take 1,000 mg by mouth 2 (two) times daily with a meal.    . neomycin-bacitracin-polymyxin (NEOSPORIN) ointment Apply  1 application topically as needed for wound care.    . pravastatin (PRAVACHOL) 40 MG tablet Take 40 mg by mouth daily.    . timolol (BETIMOL) 0.5 % ophthalmic solution Place 1 drop into both eyes daily.     No current facility-administered medications for this visit.    Allergies  Allergen Reactions  . Macadamia Nut Oil Anaphylaxis    Throat swelling and can't breathe      Review of Systems:   General:  normal appetite, decreased energy, no weight gain, no weight loss, no fever  Cardiac:  no chest pain with exertion, no chest pain at rest, +SOB with exertion, no resting SOB, no PND, no orthopnea, no palpitations, no arrhythmia, no atrial fibrillation, no LE edema, no dizzy spells, no syncope  Respiratory:  + shortness of breath, no home oxygen, + productive cough, no dry cough, no bronchitis, + wheezing, no hemoptysis, no asthma, no pain with inspiration or cough,  no sleep apnea, no CPAP at night  GI:   no difficulty swallowing, +reflux, + frequent heartburn, no hiatal hernia, no abdominal pain, no constipation, no diarrhea, no hematochezia, no hematemesis, no melena  GU:   no dysuria,  + frequency, no urinary tract infection, no hematuria, no enlarged prostate, no kidney stones, no kidney disease  Vascular:  no pain suggestive of claudication, no pain in feet, no leg cramps, no varicose veins, no DVT, no non-healing foot ulcer  Neuro:   no stroke, no TIA's, no seizures, no headaches, no temporary blindness one eye,  no slurred speech, + peripheral neuropathy, no chronic pain, mild instability of gait, no memory/cognitive dysfunction  Musculoskeletal: no arthritis, no joint swelling, no myalgias, mild difficulty walking, mildly reduced mobility   Skin:   no rash, + itching, no skin infections, no pressure sores or ulcerations  Psych:   no anxiety, no depression, no nervousness, no unusual recent stress  Eyes:   no blurry vision, no floaters, no recent vision changes, + wears glasses or  contacts  ENT:   no hearing loss, + loose or painful teeth, no dentures, last saw dentist last week - needs dental extraction  Hematologic:  no easy bruising, no abnormal bleeding, no clotting disorder, no frequent epistaxis  Endocrine:  + diabetes, does check CBG's at home           Physical Exam:   BP 132/80 (BP Location: Right Arm, Patient Position: Sitting, Cuff Size: Normal)   Pulse 73   Temp 98.4 F (36.9 C) (Skin)   Resp 20   Ht 6' (1.829 m)   Wt 140 lb (63.5 kg)   SpO2 97% Comment: RA  BMI 18.99 kg/m   General:  Elderly, malnourished and somewhat frail-appearing  HEENT:  Unremarkable except for poor dentition  Neck:   no JVD, no bruits, no adenopathy   Chest:   clear to auscultation, symmetrical breath sounds, no wheezes, no rhonchi   CV:   RRR, grade III/VI crescendo/decrescendo murmur heard best at RSB,  no diastolic murmur  Abdomen:  soft, non-tender, no masses   Extremities:  warm, well-perfused, pulses diminished, no LE edema  Rectal/GU  Deferred  Neuro:   Grossly non-focal and symmetrical throughout  Skin:   Clean and dry, no rashes, no breakdown   Diagnostic Tests:   ECHOCARDIOGRAM REPORT       Patient Name:  DEMETRE CANDANOZA Date of Exam: 03/15/2021  Medical Rec #: [id number] Height:    73.0 in  Accession #:  [id number] Weight:    143.4 lb  Date of Birth: 1946-07-16  BSA:     1.868 m  Patient Age:  49 years  BP:      172/88 mmHg  Patient Gender: M     HR:      89 bpm.  Exam Location: [facility]   Procedure: 2D Echo, Cardiac Doppler and Color Doppler   Indications:  R06.02 (ICD-10-CM) - SOB (shortness of breath)    History:    Patient has no prior history of Echocardiogram  examinations.         COPD; Risk Factors:Hypertension, Diabetes, Dyslipidemia  and         Current Smoker.    Sonographer:  Alvino Chapel RCS  Referring Phys: [id number] [place]    1.  Images are limited.  2. Left ventricular ejection fraction, by estimation, is 60 to 65%. The  left ventricle has normal function. Left ventricular endocardial border  not optimally defined to evaluate regional wall motion. There is mild left  ventricular hypertrophy. Left  ventricular diastolic parameters are indeterminate.  3. Right ventricular systolic function is normal. The right ventricular  size is normal. Tricuspid regurgitation signal is inadequate for assessing  PA pressure.  4. The mitral valve is grossly normal. Trivial mitral valve  regurgitation.  5. The aortic valve is likely functionally bicuspid. There is severe  calcifcation of the aortic valve. Aortic valve regurgitation is trivial.  Probable severe aortic valve stenosis based on leallet motion/appearance  as well as dimentionless index 0.23.  Aortic valve mean gradient measures 22 mmHg. Aortic valve Vmax measures  3.02 m/s.  6. The inferior vena cava is normal in size with greater than 50%  respiratory variability, suggesting right atrial pressure of 3 mmHg.   FINDINGS  Left Ventricle: Left ventricular ejection fraction, by estimation, is 60  to 65%. The left ventricle has normal function. Left ventricular  endocardial border not optimally defined to evaluate regional wall motion.  The left ventricular internal cavity  size was normal in size. There is mild left ventricular hypertrophy. Left  ventricular diastolic parameters are indeterminate.   Right Ventricle: The right ventricular size is normal. No increase in  right ventricular wall thickness. Right ventricular systolic function is  normal. Tricuspid regurgitation signal is inadequate for assessing PA  pressure.   Left Atrium: Left atrial size was normal in size.   Right Atrium: Right atrial size was normal in size.   Pericardium: There is no evidence of pericardial effusion.   Mitral Valve: The mitral valve is grossly normal. Trivial mitral valve   regurgitation.   Tricuspid Valve: The tricuspid valve is grossly normal. Tricuspid valve  regurgitation is trivial.   Aortic Valve: The aortic valve is bicuspid. There is severe calcifcation  of the aortic valve. Aortic valve regurgitation is trivial. Severe aortic  stenosis is present. Aortic valve mean gradient measures 22.0 mmHg. Aortic  valve peak gradient measures  36.5 mmHg. Aortic valve area, by VTI measures 0.72 cm.   Pulmonic Valve: The pulmonic valve was grossly normal. Pulmonic valve  regurgitation is trivial.   Aorta: The aortic root is normal in size and structure.   Venous: The inferior vena cava is normal in size with greater than 50%  respiratory variability, suggesting right atrial pressure of 3 mmHg.   IAS/Shunts: No atrial level shunt detected by color flow Doppler.     LEFT VENTRICLE  PLAX 2D  LVIDd:     4.60 cm Diastology  LVIDs:     3.20 cm LV e' medial:  6.74 cm/s  LV PW:     1.00 cm LV E/e' medial: 7.1  LV IVS:    1.10 cm LV e' lateral:  7.18 cm/s  LVOT diam:   2.00 cm LV E/e' lateral: 6.7  LV SV:     45  LV SV Index:  24  LVOT Area:   3.14 cm     RIGHT VENTRICLE  RV S prime:   12.60 cm/s  TAPSE (M-mode): 1.5 cm   LEFT ATRIUM       Index    RIGHT ATRIUM      Index  LA diam:    3.10 cm 1.66 cm/m RA Area:   16.10 cm  LA Vol (A2C):  47.4 ml 25.37 ml/m RA Volume:  41.60 ml 22.27 ml/m  LA Vol (A4C):  49.6 ml 26.55 ml/m  LA Biplane Vol: 53.2 ml 28.48 ml/m  AORTIC  VALVE  AV Area (Vmax):  0.72 cm  AV Area (Vmean):  0.83 cm  AV Area (VTI):   0.72 cm  AV Vmax:      302.00 cm/s  AV Vmean:     208.000 cm/s  AV VTI:      0.623 m  AV Peak Grad:   36.5 mmHg  AV Mean Grad:   22.0 mmHg  LVOT Vmax:     69.00 cm/s  LVOT Vmean:    55.100 cm/s  LVOT VTI:     0.143 m  LVOT/AV VTI ratio: 0.23    AORTA  Ao Root diam: 3.10 cm   MITRAL VALVE   MV Area (PHT): 1.50 cm  SHUNTS  MV Decel Time: 507 msec  Systemic VTI: 0.14 m  MV E velocity: 48.00 cm/s Systemic Diam: 2.00 cm  MV A velocity: 82.10 cm/s  MV E/A ratio: 0.58   Rozann Lesches MD  Electronically signed by Rozann Lesches MD  Signature Date/Time: 03/15/2021/11:33:52 AM      EKG: NSR w/out significant AV conduction delay (03/26/2021)      RIGHT/LEFT HEART CATH AND CORONARY ANGIOGRAPHY    Conclusion  1. No angiographic evidence of CAD 2. Paradoxical low flow/low gradient Severe aortic stenosis by echo (by cath: mean gradient 11.8 mmHg, peak to peak gradient 11 mmHg) 3. RA 1, RV 29/0/1, PA 27/10 (mean 16), PCWP 2, LV 132/1/3, AO 124/61  Recommendations: Will continue workup for TAVR.   Indications  Severe aortic stenosis [I35.0 (ICD-10-CM)]   Procedural Details  Technical Details Indication: Severe aortic stenosis  Procedure: The risks, benefits, complications, treatment options, and expected outcomes were discussed with the patient. The patient and/or family concurred with the proposed plan, giving informed consent. The patient was brought to the cath lab after IV hydration was given. The patient was sedated with Versed and Fentanyl. The IV catheter in the right antecubital vein was changed for a 7 [place] sheath. Right heart catheterization performed with a balloon tipped catheter. The right wrist was prepped and draped in a sterile fashion. 1% lidocaine was used for local anesthesia. Using the modified Seldinger access technique, a 5 French sheath was placed in the right radial artery. 3 mg Verapamil was given through the sheath. 3500 units IV heparin was given. Standard diagnostic catheters were used to perform selective coronary angiography. The aortic valve was crossed with an AL-1 and the J wire. No LV gram. The sheath was removed from the right radial artery and a Terumo hemostasis band was applied at the arteriotomy site on the right wrist.   \ Estimated blood loss <50 mL.   During this procedure medications were administered to achieve and maintain moderate conscious sedation while the patient's heart rate, blood pressure, and oxygen saturation were continuously monitored and I was present face-to-face 100% of this time.   Medications (Filter: Administrations occurring from 1103 to 1210 on 04/18/21) (important) Continuous medications are totaled by the amount administered until 04/18/21 1210.    fentaNYL (SUBLIMAZE) injection (mcg) Total dose:  50 mcg  Date/Time Rate/Dose/Volume Action   04/18/21 1121 50 mcg Given    midazolam (VERSED) injection (mg) Total dose:  2 mg  Date/Time Rate/Dose/Volume Action   04/18/21 1121 2 mg Given    lidocaine (PF) (XYLOCAINE) 1 % injection (mL) Total volume:  4 mL  Date/Time Rate/Dose/Volume Action   04/18/21 1130 4 mL Given    Radial Cocktail/Verapamil only (mL) Total volume:  10 mL  Date/Time Rate/Dose/Volume Action  04/18/21 1139 10 mL Given    Heparin (Porcine) in NaCl 1000-0.9 UT/500ML-% SOLN (mL) Total volume:  1,000 mL  Date/Time Rate/Dose/Volume Action   04/18/21 1140 500 mL Given   1140 500 mL Given    heparin sodium (porcine) injection (Units) Total dose:  3,500 Units  Date/Time Rate/Dose/Volume Action   04/18/21 1142 3,500 Units Given    iohexol (OMNIPAQUE) 350 MG/ML injection (mL) Total volume:  55 mL  Date/Time Rate/Dose/Volume Action   04/18/21 1158 55 mL Given    Sedation Time  Sedation Time Physician-1: 34 minutes 28 seconds   Contrast  Medication Name Total Dose  iohexol (OMNIPAQUE) 350 MG/ML injection 55 mL    Radiation/Fluoro  Fluoro time: 7.8 (min) DAP: 12278 (mGycm2) Cumulative Air Kerma: 123XX123 (mGy)   Complications   Complications documented before study signed (04/18/2021 12:16 PM)     RIGHT/LEFT HEART CATH AND CORONARY ANGIOGRAPHY  None Documented by Burnell Blanks, MD 04/18/2021 12:04 PM  Date Found:  04/18/2021  Time Range: Intraprocedure       Coronary Findings   Diagnostic Dominance: Right  Left Anterior Descending  Vessel is large.  Left Circumflex  Vessel is large.  Right Coronary Artery  Vessel is large.   Intervention   No interventions have been documented.  Coronary Diagrams   Diagnostic Dominance: Right    Intervention    Implants    No implant documentation for this case.    Syngo Images  Show images for CARDIAC CATHETERIZATION  Images on Long Term Storage  Show images for Jahmeek, Pechacek to Procedure Log  Procedure Log     Hemo Data  Flowsheet Row Most Recent Value  Fick Cardiac Output 5.3 L/min  Fick Cardiac Output Index 2.86 (L/min)/BSA  Aortic Mean Gradient 11.79 mmHg  Aortic Peak Gradient 11 mmHg  Aortic Valve Area 1.87  Aortic Value Area Index 1.01 cm2/BSA  RA A Wave 2 mmHg  RA V Wave 1 mmHg  RA Mean 0 mmHg  RV Systolic Pressure 29 mmHg  RV Diastolic Pressure 0 mmHg  RV EDP 1 mmHg  PA Systolic Pressure 27 mmHg  PA Diastolic Pressure 10 mmHg  PA Mean 16 mmHg  PW A Wave 3 mmHg  PW V Wave 3 mmHg  PW Mean 2 mmHg  AO Systolic Pressure 123456 mmHg  AO Diastolic Pressure 58 mmHg  AO Mean 80 mmHg  LV Systolic Pressure Q000111Q mmHg  LV Diastolic Pressure 1 mmHg  LV EDP 3 mmHg  AOp Systolic Pressure A999333 mmHg  AOp Diastolic Pressure 61 mmHg  AOp Mean Pressure 86 mmHg  LVp Systolic Pressure [id number] mmHg  LVp Diastolic Pressure 1 mmHg  LVp EDP Pressure 4 mmHg  QP/QS 1  TPVR Index 5.6 HRUI  TSVR Index 28 HRUI  TPVR/TSVR Ratio 0.2     Cardiac TAVR CT  TECHNIQUE: The patient was scanned on a [employer]. A 90 kV retrospective scan was triggered in the descending thoracic aorta at 111 HU's. Gantry rotation speed was 250 msecs and collimation was .6 mm. No beta blockade or nitro were given. The 3D data set was reconstructed in 5% intervals of the R-R cycle. Systolic and diastolic phases were analyzed on a  dedicated work station using MPR, MIP and VRT modes. The patient received 100 cc of contrast.  FINDINGS: Image quality: Excellent.  Noise artifact is: Limited.  Valve Morphology: The aortic valve is tricuspid. The leaflets are severely calcified with diffuse calcifications. The [facility] is  immobile. The other leaflets demonstrate severely reduced motion in systole.  Aortic Valve Calcium score: 1485  Aortic annular dimension:  Phase assessed: 25%  Annular area: 658 mm2  Annular perimeter: 91.8 mm  Max diameter: 30.8 mm  Min diameter: 28.1 mm  Annular and subannular calcification: None.  Optimal coplanar projection: LAO 1 CAU 6  Coronary Artery Height above Annulus:  Left Main: 9.6 mm  Right Coronary: 21.1 mm  Sinus of Valsalva Measurements:  Non-coronary: 36 mm  Right-coronary: 36 mm  Left-coronary: 36 mm  Sinus of Valsalva Height:  Non-coronary: 24.6 mm  Right-coronary: 27.7 mm  Left-coronary: 21.7 mm  Sinotubular Junction: 34 mm  Ascending Thoracic Aorta: Diffuse ectasia noted, up to 42 mm (double-oblique)  Coronary Arteries: Normal coronary origin. Right dominance. The study was performed without use of NTG and is insufficient for plaque evaluation. Please refer to recent cardiac catheterization for coronary assessment. Mild coronary calcifications noted.  Cardiac Morphology:  Right Atrium: Right atrial size is within normal limits.  Right Ventricle: The right ventricular cavity is within normal limits.  Left Atrium: Left atrial size is normal in size with no left atrial appendage filling defect.  Left Ventricle: The ventricular cavity size is within normal limits. There are no stigmata of prior infarction. There is no abnormal filling defect. Normal left ventricular function, LVEF 56%. No regional wall motion abnormalities.  Pulmonary arteries: Normal in size without proximal filling defect.  Pulmonary  veins: Normal pulmonary venous drainage.  Pericardium: Normal thickness with no significant effusion or calcium present.  Mitral Valve: The mitral valve is normal structure without significant calcification.  Extra-cardiac findings: See attached radiology report for non-cardiac structures.  IMPRESSION: 1. Tricuspid aortic valve with severe diffuse calcifications.  2. Annular measurements appropriate for 29 mm [place] 3 TAVR (658 mm2).  3. No significant annular or subannular calcifications.  4. Borderline left main coronary height (9.6 mm) but large aortic sinuses noted (36 mm).  5. Optimal Fluoroscopic Angle for Delivery: LAO 1 CAU 6.  6. Diffusely ectatic of the ascending aorta noted that is mildly dilated (42 mm double-oblique).  [place] T. Audie Box, MD   Electronically Signed   By: Eleonore Chiquito   On: 04/27/2021 14:22    CT ANGIOGRAPHY CHEST, ABDOMEN AND PELVIS  TECHNIQUE: Multidetector CT imaging through the chest, abdomen and pelvis was performed using the standard protocol during bolus administration of intravenous contrast. Multiplanar reconstructed images and MIPs were obtained and reviewed to evaluate the vascular anatomy.  CONTRAST:  55mL OMNIPAQUE IOHEXOL 350 MG/ML SOLN  COMPARISON:  Chest CTA 03/01/2019. No prior CT the abdomen or pelvis.  FINDINGS: CTA CHEST FINDINGS  Cardiovascular: Heart size is borderline enlarged. There is no significant pericardial fluid, thickening or pericardial calcification. There is aortic atherosclerosis, as well as atherosclerosis of the great vessels of the mediastinum and the coronary arteries, including calcified atherosclerotic plaque in the left anterior descending coronary artery. Severe thickening and calcification of the aortic valve. Ectasia of ascending thoracic aorta (4.0 cm in diameter).  Mediastinum/Lymph Nodes: No pathologically enlarged mediastinal or hilar lymph nodes. Esophagus is  unremarkable in appearance. No axillary lymphadenopathy.  Lungs/Pleura: Architectural distortion in the anterior aspect of the left upper lobe, similar to prior examinations, most compatible with areas of chronic post infectious or inflammatory scarring. No definite suspicious appearing pulmonary nodules or masses are noted. No acute consolidative airspace disease. No pleural effusions. Diffuse bronchial wall thickening with moderate centrilobular and paraseptal emphysema.  Musculoskeletal/Soft Tissues: There are no  aggressive appearing lytic or blastic lesions noted in the visualized portions of the skeleton.  CTA ABDOMEN AND PELVIS FINDINGS  Hepatobiliary: 1.3 x 0.9 cm low-attenuation lesion in segment 3 of the liver, compatible with a simple cyst. No other suspicious hepatic lesions. No intra or extrahepatic biliary ductal dilatation. Gallbladder is normal in appearance.  Pancreas: Pancreas is severely atrophic with extensive coarse parenchymal calcifications, indicative of chronic pancreatitis. No pancreatic mass. No peripancreatic fluid collections or inflammatory changes.  Spleen: Unremarkable.  Adrenals/Urinary Tract: 1.5 x 1.0 cm low-attenuation lesion in the anterior aspect of the upper pole the right kidney, compatible with a simple cyst. Left kidney and bilateral adrenal glands are normal in appearance. No hydroureteronephrosis. Urinary bladder is unremarkable in appearance.  Stomach/Bowel: Stomach is decompressed. Allowing for this, there is potential diffuse mural thickening throughout the stomach. No pathologic dilatation of small bowel or colon. The appendix is not confidently identified and may be surgically absent. Regardless, there are no inflammatory changes noted adjacent to the cecum to suggest the presence of an acute appendicitis at this time.  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 Vascular findings and measurements pertinent to potential TAVR procedure, as detailed below.  Reproductive: Prostate gland and seminal vesicles are unremarkable in appearance.  Other: No significant volume of ascites.  No pneumoperitoneum.  Musculoskeletal: There are no aggressive appearing lytic or blastic lesions noted in the visualized portions of the skeleton.  VASCULAR MEASUREMENTS PERTINENT TO TAVR:  AORTA:  Minimal Aortic Diameter-17 x 14 mm  Severity of Aortic Calcification-moderate to severe  RIGHT PELVIS:  Right Common Iliac Artery -  Minimal Diameter-10.9 x 7.0 mm  Tortuosity-mild  Calcification - severe  Right External Iliac Artery -  Minimal Diameter-5.9 x 4.3 mm  Tortuosity-severe  Calcification - severe  Right Common Femoral Artery -  Minimal Diameter-6.9 x 5.4 mm  Tortuosity-mild  Calcification - severe  LEFT PELVIS:  Left Common Iliac Artery -  Minimal Diameter-10.0 x 6.5 mm  Tortuosity-moderate  Calcification - severe  Left External Iliac Artery -  Minimal Diameter-completely occluded  Tortuosity-severe  Calcification-severe  Left Common Femoral Artery -  Minimal Diameter-completely occluded  Tortuosity-mild  Calcification - severe  Review of the MIP images confirms the above findings.  IMPRESSION: 1. 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 2. Severe thickening calcification of the aortic valve, compatible with reported clinical history  of severe aortic stenosis. 3. Aortic atherosclerosis, in addition to left anterior descending coronary artery disease. 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 4. Diffuse bronchial wall thickening with moderate centrilobular and paraseptal emphysema; imaging findings suggestive of underlying COPD. 5. Stigmata of chronic pancreatitis, as above. 6. Possible diffuse mural thickening throughout the stomach. Clinical correlation for gastritis is recommended. The possibility of infiltrative gastric neoplasm is not entirely excluded. Consideration for further evaluation with nonemergent endoscopy is recommended if clinically appropriate. 7. Additional incidental findings, as above.  Electronically Signed   By: Vinnie Langton M.D.   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  He describes gradual worsening of symptoms of exertional shortness of breath and fatigue consistent with chronic diastolic congestive heart failure, [place] Heart Association functional class IIb bordering on class III.  Some of his symptoms of shortness of breath may also be related to his underlying chronic lung disease with longstanding and ongoing tobacco abuse.  I have personally reviewed the patient's recent transthoracic echocardiogram, EKG, diagnostic cardiac catheterization, and CT angiograms.  Echocardiogram is notable for marginal acoustic windows but findings consistent with severe aortic stenosis.  The patient appears to have Sievers type I bicuspid aortic valve with severe calcification, thickening, and restricted leaflet mobility involving all 3 of the leaflets.  One of them appears essentially immobile.  Peak velocity  across aortic valve measured just over 3 m/s corresponding to mean transvalvular gradient estimated 22 mmHg but aortic valve area calculated only 0.72 cm.  The DVI was notably quite low at 0.23 and stroke-volume index only 24.  Left ventricular ejection fraction was reported 60 to 65% although I believe that number is generous and systolic function is probably mildly reduced.  Diagnostic cardiac catheterization confirmed the presence of aortic stenosis and revealed only mild nonobstructive coronary artery disease.  Right heart pressures were normal.  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  Options include conventional surgical aortic valve replacement with or without plication or graft replacement of the proximal aorta versus transcatheter aortic valve replacement.  However, the patient has underlying severe COPD and I am very concerned that he would not do well with conventional surgery.  CTA of the aorta and iliac vessels demonstrates both severe tortuosity and atherosclerotic disease.  Although it is conceivable that access could be obtained through the right femoral approach, I am concerned by the severity of tortuosity and atherosclerotic disease throughout and would favor alternative access via left subclavian approach.    Plan:  The patient and his close friend were counseled at length regarding treatment alternatives for management of severe symptomatic aortic stenosis. Alternative approaches such as conventional aortic valve replacement, transcatheter aortic valve replacement, and continued medical therapy without intervention were compared and contrasted at length.  The risks associated with conventional surgical  aortic valve replacement were discussed in detail, as were expectations for post-operative convalescence, and why I would be reluctant to consider this patient a candidate for conventional surgery.  Issues specific to transcatheter aortic valve replacement were discussed including questions about long term valve durability, the potential for paravalvular leak, possible increased risk of need for permanent pacemaker placement, and other technical complications related to the procedure itself.  We also discussed the presence of significant aortoiliac disease and the possible need for alternative access to facilitate transcatheter valve replacement.  Long-term prognosis with medical therapy was discussed. This discussion was placed in the context of the patient's own specific clinical presentation and past medical history.  All of their questions have been addressed.  The patient is interested in proceeding with transcatheter aortic valve replacement via left subclavian approach in the near future.  This will be scheduled once dental extraction has been completed.  Following  the decision to proceed with transcatheter aortic valve replacement, a discussion has been held regarding what types of management strategies would be attempted intraoperatively in the event of life-threatening complications, including whether or not the patient would be considered a candidate for the use of cardiopulmonary bypass and/or conversion to open sternotomy for attempted surgical intervention.  The patient specifically requests that should a potentially life-threatening complication develop we would attempt emergency median sternotomy and/or other aggressive surgical procedures.  The patient has been advised of a variety of complications that might develop including but not limited to risks of death, stroke, paravalvular leak, aortic dissection or other major vascular complications, aortic annulus rupture, device embolization, cardiac  rupture or perforation, mitral regurgitation, acute myocardial infarction, arrhythmia, heart block or bradycardia requiring permanent pacemaker placement, congestive heart failure, respiratory failure, renal failure, pneumonia, infection, other late complications related to structural valve deterioration or migration, or other complications that might ultimately cause a temporary or permanent loss of functional independence or other long term morbidity.  The patient provides full informed consent for the procedure as described and all questions were answered.      I spent in excess of 90 minutes during the conduct of this office consultation and >50% of this time involved direct face-to-face encounter with the patient for counseling and/or coordination of their care.     Valentina Gu. Roxy Manns, MD 05/14/2021 3:56 PM

## 2021-05-14 NOTE — Patient Instructions (Addendum)
Stop smoking immediately and permanently.  Continue all previous medications without any changes at this time  

## 2021-05-14 NOTE — H&P (View-Only) (Signed)
HEART AND [place] SURGERY CONSULTATION REPORT  Primary Cardiologist is Rozann Lesches, MD PCP is Lemmie Evens, MD  Chief Complaint  Patient presents with  . Aortic Stenosis    Surgical consult for TAVR, review all studies    HPI:  Patient is 75 year old male with history of aortic stenosis, longstanding tobacco abuse with COPD, hypertension, peripheral arterial disease, type 2 diabetes mellitus, and GE reflux disease who has been referred for surgical consultation to discuss treatment options for management of severe symptomatic aortic stenosis.  Patient states that he has known of presence of a heart murmur for many years.  He states that he was in his usual state of health without any physical limitations until fall 2019 when he was hospitalized in [place] for severe necrotizing pneumonia.  He states that he never completely recovered.  Echocardiogram performed at that time reportedly revealed mild aortic stenosis with normal left ventricular systolic function.  The patient was seen in follow-up recently by Dr. Domenic Polite and complaining of worsening symptoms of exertional shortness of breath.  Follow-up echocardiogram performed March 15, 2021 revealed normal left ventricular function with ejection fraction estimated 60 to 65%.  The aortic valve appeared functionally bicuspid with severe calcification and probably severe aortic stenosis.  Peak velocity across aortic valve measured 3.0 m/s but the dimensionless index was notably quite low at 0.23 and aortic valve area calculated 0.72 cm by VTI.  The patient was referred to the multidisciplinary heart valve clinic and has been evaluated previously by Dr. Angelena Form.  Diagnostic cardiac catheterization performed April 18, 2021 revealed mild nonobstructive coronary artery disease normal right-sided pressures and preserved cardiac output.  CT angiography was performed and the patient  referred for surgical consultation.  Patient is single and lives with his close friend in [place].  He has been retired for approximately 10 years.  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  He states that he is never completely recovered from that.  He is now limited by exertional shortness of breath and fatigue.  He gets short of breath with low-level activity.  He denies resting shortness of breath.  He has not had any dizzy spells or syncope.  He has poor energy.  Weight has been stable.  He reports intermittent productive cough and occasional wheezing.  He continues to smoke cigarettes.  He has some difficulty walking because of numbness involving both feet suggestive of diabetic peripheral neuropathy.  However he ambulates without need for any sort of mechanical support or assistance.  Past Medical History:  Diagnosis Date  . Aortic atherosclerosis ([place])   . Aortic stenosis   . Carotid artery disease ([place])   . COPD (chronic obstructive pulmonary disease) ([place])   . Diverticulosis   . Erectile dysfunction   . Essential hypertension   . GERD (gastroesophageal reflux disease)   . Glaucoma   . H/O hiatal hernia   . PAD (peripheral artery disease) ([place])   . Type 2 diabetes mellitus ([place])     Past Surgical History:  Procedure Laterality Date  . COLONOSCOPY N/A 04/06/2013   Procedure: COLONOSCOPY;  Surgeon: Jamesetta So, MD;  Location: [facility];  Service: Gastroenterology;  Laterality: N/A;  . HERNIA REPAIR     Three prior surgeries before 1966  . RIGHT/LEFT HEART CATH AND CORONARY ANGIOGRAPHY N/A 04/18/2021   Procedure: RIGHT/LEFT HEART CATH AND CORONARY  ANGIOGRAPHY;  Surgeon: Burnell Blanks, MD;  Location: [facility];  Service: Cardiovascular;  Laterality: N/A;    Family History  Problem Relation Age of Onset  . Aortic stenosis Father   . COPD Mother   . Cancer Neg Hx      Social History   Socioeconomic History  . Marital status: Divorced    Spouse name: Not on file  . Number of children: 0  . Years of education: Not on file  . Highest education level: Not on file  Occupational History  . 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  Tobacco Use  . Smoking status: Heavy Tobacco Smoker    Packs/day: 2.00    Years: 56.00    Pack years: 112.00    Types: Cigarettes  . Smokeless tobacco: Never Used  . Tobacco comment: pt refuses hand outs   Substance and Sexual Activity  . Alcohol use: No  . Drug use: No  . Sexual activity: Not on file  Other Topics Concern  . Not on file  Social History Narrative  . Not on file   Social Determinants of Health   Financial Resource Strain: Not on file  Food Insecurity: Not on file  Transportation Needs: Not on file  Physical Activity: Not on file  Stress: Not on file  Social Connections: Not on file  Intimate Partner Violence: Not on file    Current Outpatient Medications  Medication Sig Dispense Refill  . aspirin 325 MG EC tablet Take 325 mg by mouth daily.    . brimonidine (ALPHAGAN) 0.2 % ophthalmic solution Place 1 drop into both eyes 3 (three) times daily.    . diphenhydrAMINE (BENADRYL) 25 MG tablet Take 25 mg by mouth daily as needed for allergies.    Marland Kitchen esomeprazole (NEXIUM) 20 MG capsule Take 20 mg by mouth daily at 12 noon.    . furosemide (LASIX) 40 MG tablet Take 40 mg by mouth every other day.    . hydrochlorothiazide (HYDRODIURIL) 12.5 MG tablet Take 12.5 mg by mouth daily.    Marland Kitchen ibuprofen (ADVIL,MOTRIN) 200 MG tablet Take 800 mg by mouth every 6 (six) hours as needed for mild pain.    Marland Kitchen latanoprost (XALATAN) 0.005 % ophthalmic solution Place 1 drop into both eyes at bedtime.    . Melatonin 10 MG CAPS Take 40 mg by mouth at bedtime.    . metFORMIN (GLUCOPHAGE) 1000 MG tablet Take 1,000 mg by mouth 2 (two) times daily with a meal.    . neomycin-bacitracin-polymyxin (NEOSPORIN) ointment Apply  1 application topically as needed for wound care.    . pravastatin (PRAVACHOL) 40 MG tablet Take 40 mg by mouth daily.    . timolol (BETIMOL) 0.5 % ophthalmic solution Place 1 drop into both eyes daily.     No current facility-administered medications for this visit.    Allergies  Allergen Reactions  . Macadamia Nut Oil Anaphylaxis    Throat swelling and can't breathe      Review of Systems:   General:  normal appetite, decreased energy, no weight gain, no weight loss, no fever  Cardiac:  no chest pain with exertion, no chest pain at rest, +SOB with exertion, no resting SOB, no PND, no orthopnea, no palpitations, no arrhythmia, no atrial fibrillation, no LE edema, no dizzy spells, no syncope  Respiratory:  + shortness of breath, no home oxygen, + productive cough, no dry cough, no bronchitis, + wheezing, no hemoptysis, no asthma, no pain with inspiration or cough,  no sleep apnea, no CPAP at night  GI:   no difficulty swallowing, +reflux, + frequent heartburn, no hiatal hernia, no abdominal pain, no constipation, no diarrhea, no hematochezia, no hematemesis, no melena  GU:   no dysuria,  + frequency, no urinary tract infection, no hematuria, no enlarged prostate, no kidney stones, no kidney disease  Vascular:  no pain suggestive of claudication, no pain in feet, no leg cramps, no varicose veins, no DVT, no non-healing foot ulcer  Neuro:   no stroke, no TIA's, no seizures, no headaches, no temporary blindness one eye,  no slurred speech, + peripheral neuropathy, no chronic pain, mild instability of gait, no memory/cognitive dysfunction  Musculoskeletal: no arthritis, no joint swelling, no myalgias, mild difficulty walking, mildly reduced mobility   Skin:   no rash, + itching, no skin infections, no pressure sores or ulcerations  Psych:   no anxiety, no depression, no nervousness, no unusual recent stress  Eyes:   no blurry vision, no floaters, no recent vision changes, + wears glasses or  contacts  ENT:   no hearing loss, + loose or painful teeth, no dentures, last saw dentist last week - needs dental extraction  Hematologic:  no easy bruising, no abnormal bleeding, no clotting disorder, no frequent epistaxis  Endocrine:  + diabetes, does check CBG's at home           Physical Exam:   BP 132/80 (BP Location: Right Arm, Patient Position: Sitting, Cuff Size: Normal)   Pulse 73   Temp 98.4 F (36.9 C) (Skin)   Resp 20   Ht 6' (1.829 m)   Wt 140 lb (63.5 kg)   SpO2 97% Comment: RA  BMI 18.99 kg/m   General:  Elderly, malnourished and somewhat frail-appearing  HEENT:  Unremarkable except for poor dentition  Neck:   no JVD, no bruits, no adenopathy   Chest:   clear to auscultation, symmetrical breath sounds, no wheezes, no rhonchi   CV:   RRR, grade III/VI crescendo/decrescendo murmur heard best at RSB,  no diastolic murmur  Abdomen:  soft, non-tender, no masses   Extremities:  warm, well-perfused, pulses diminished, no LE edema  Rectal/GU  Deferred  Neuro:   Grossly non-focal and symmetrical throughout  Skin:   Clean and dry, no rashes, no breakdown   Diagnostic Tests:   ECHOCARDIOGRAM REPORT       Patient Name:  Patrick Beck Date of Exam: 03/15/2021  Medical Rec #: [id number] Height:    73.0 in  Accession #:  [id number] Weight:    143.4 lb  Date of Birth: [birth year]  BSA:     1.868 m  Patient Age:  57 years  BP:      172/88 mmHg  Patient Gender: M     HR:      89 bpm.  Exam Location: [facility]   Procedure: 2D Echo, Cardiac Doppler and Color Doppler   Indications:  R06.02 (ICD-10-CM) - SOB (shortness of breath)    History:    Patient has no prior history of Echocardiogram  examinations.         COPD; Risk Factors:Hypertension, Diabetes, Dyslipidemia  and         Current Smoker.    Sonographer:  Alvino Chapel RCS  Referring Phys: [id number] [place]    1.  Images are limited.  2. Left ventricular ejection fraction, by estimation, is 60 to 65%. The  left ventricle has normal function. Left ventricular endocardial border  not optimally defined to evaluate regional wall motion. There is mild left  ventricular hypertrophy. Left  ventricular diastolic parameters are indeterminate.  3. Right ventricular systolic function is normal. The right ventricular  size is normal. Tricuspid regurgitation signal is inadequate for assessing  PA pressure.  4. The mitral valve is grossly normal. Trivial mitral valve  regurgitation.  5. The aortic valve is likely functionally bicuspid. There is severe  calcifcation of the aortic valve. Aortic valve regurgitation is trivial.  Probable severe aortic valve stenosis based on leallet motion/appearance  as well as dimentionless index 0.23.  Aortic valve mean gradient measures 22 mmHg. Aortic valve Vmax measures  3.02 m/s.  6. The inferior vena cava is normal in size with greater than 50%  respiratory variability, suggesting right atrial pressure of 3 mmHg.   FINDINGS  Left Ventricle: Left ventricular ejection fraction, by estimation, is 60  to 65%. The left ventricle has normal function. Left ventricular  endocardial border not optimally defined to evaluate regional wall motion.  The left ventricular internal cavity  size was normal in size. There is mild left ventricular hypertrophy. Left  ventricular diastolic parameters are indeterminate.   Right Ventricle: The right ventricular size is normal. No increase in  right ventricular wall thickness. Right ventricular systolic function is  normal. Tricuspid regurgitation signal is inadequate for assessing PA  pressure.   Left Atrium: Left atrial size was normal in size.   Right Atrium: Right atrial size was normal in size.   Pericardium: There is no evidence of pericardial effusion.   Mitral Valve: The mitral valve is grossly normal. Trivial mitral valve   regurgitation.   Tricuspid Valve: The tricuspid valve is grossly normal. Tricuspid valve  regurgitation is trivial.   Aortic Valve: The aortic valve is bicuspid. There is severe calcifcation  of the aortic valve. Aortic valve regurgitation is trivial. Severe aortic  stenosis is present. Aortic valve mean gradient measures 22.0 mmHg. Aortic  valve peak gradient measures  36.5 mmHg. Aortic valve area, by VTI measures 0.72 cm.   Pulmonic Valve: The pulmonic valve was grossly normal. Pulmonic valve  regurgitation is trivial.   Aorta: The aortic root is normal in size and structure.   Venous: The inferior vena cava is normal in size with greater than 50%  respiratory variability, suggesting right atrial pressure of 3 mmHg.   IAS/Shunts: No atrial level shunt detected by color flow Doppler.     LEFT VENTRICLE  PLAX 2D  LVIDd:     4.60 cm Diastology  LVIDs:     3.20 cm LV e' medial:  6.74 cm/s  LV PW:     1.00 cm LV E/e' medial: 7.1  LV IVS:    1.10 cm LV e' lateral:  7.18 cm/s  LVOT diam:   2.00 cm LV E/e' lateral: 6.7  LV SV:     45  LV SV Index:  24  LVOT Area:   3.14 cm     RIGHT VENTRICLE  RV S prime:   12.60 cm/s  TAPSE (M-mode): 1.5 cm   LEFT ATRIUM       Index    RIGHT ATRIUM      Index  LA diam:    3.10 cm 1.66 cm/m RA Area:   16.10 cm  LA Vol (A2C):  47.4 ml 25.37 ml/m RA Volume:  41.60 ml 22.27 ml/m  LA Vol (A4C):  49.6 ml 26.55 ml/m  LA Biplane Vol: 53.2 ml 28.48 ml/m  AORTIC  VALVE  AV Area (Vmax):  0.72 cm  AV Area (Vmean):  0.83 cm  AV Area (VTI):   0.72 cm  AV Vmax:      302.00 cm/s  AV Vmean:     208.000 cm/s  AV VTI:      0.623 m  AV Peak Grad:   36.5 mmHg  AV Mean Grad:   22.0 mmHg  LVOT Vmax:     69.00 cm/s  LVOT Vmean:    55.100 cm/s  LVOT VTI:     0.143 m  LVOT/AV VTI ratio: 0.23    AORTA  Ao Root diam: 3.10 cm   MITRAL VALVE   MV Area (PHT): 1.50 cm  SHUNTS  MV Decel Time: 507 msec  Systemic VTI: 0.14 m  MV E velocity: 48.00 cm/s Systemic Diam: 2.00 cm  MV A velocity: 82.10 cm/s  MV E/A ratio: 0.58   Rozann Lesches MD  Electronically signed by Rozann Lesches MD  Signature Date/Time: 03/15/2021/11:33:52 AM      EKG: NSR w/out significant AV conduction delay (03/26/2021)      RIGHT/LEFT HEART CATH AND CORONARY ANGIOGRAPHY    Conclusion  1. No angiographic evidence of CAD 2. Paradoxical low flow/low gradient Severe aortic stenosis by echo (by cath: mean gradient 11.8 mmHg, peak to peak gradient 11 mmHg) 3. RA 1, RV 29/0/1, PA 27/10 (mean 16), PCWP 2, LV 132/1/3, AO 124/61  Recommendations: Will continue workup for TAVR.   Indications  Severe aortic stenosis [I35.0 (ICD-10-CM)]   Procedural Details  Technical Details Indication: Severe aortic stenosis  Procedure: The risks, benefits, complications, treatment options, and expected outcomes were discussed with the patient. The patient and/or family concurred with the proposed plan, giving informed consent. The patient was brought to the cath lab after IV hydration was given. The patient was sedated with Versed and Fentanyl. The IV catheter in the right antecubital vein was changed for a 7 [place] sheath. Right heart catheterization performed with a balloon tipped catheter. The right wrist was prepped and draped in a sterile fashion. 1% lidocaine was used for local anesthesia. Using the modified Seldinger access technique, a 5 French sheath was placed in the right radial artery. 3 mg Verapamil was given through the sheath. 3500 units IV heparin was given. Standard diagnostic catheters were used to perform selective coronary angiography. The aortic valve was crossed with an AL-1 and the J wire. No LV gram. The sheath was removed from the right radial artery and a Terumo hemostasis band was applied at the arteriotomy site on the right wrist.   \ Estimated blood loss <50 mL.   During this procedure medications were administered to achieve and maintain moderate conscious sedation while the patient's heart rate, blood pressure, and oxygen saturation were continuously monitored and I was present face-to-face 100% of this time.   Medications (Filter: Administrations occurring from 1103 to 1210 on 04/18/21) (important) Continuous medications are totaled by the amount administered until 04/18/21 1210.    fentaNYL (SUBLIMAZE) injection (mcg) Total dose:  50 mcg  Date/Time Rate/Dose/Volume Action   04/18/21 1121 50 mcg Given    midazolam (VERSED) injection (mg) Total dose:  2 mg  Date/Time Rate/Dose/Volume Action   04/18/21 1121 2 mg Given    lidocaine (PF) (XYLOCAINE) 1 % injection (mL) Total volume:  4 mL  Date/Time Rate/Dose/Volume Action   04/18/21 1130 4 mL Given    Radial Cocktail/Verapamil only (mL) Total volume:  10 mL  Date/Time Rate/Dose/Volume Action  04/18/21 1139 10 mL Given    Heparin (Porcine) in NaCl 1000-0.9 UT/500ML-% SOLN (mL) Total volume:  1,000 mL  Date/Time Rate/Dose/Volume Action   04/18/21 1140 500 mL Given   1140 500 mL Given    heparin sodium (porcine) injection (Units) Total dose:  3,500 Units  Date/Time Rate/Dose/Volume Action   04/18/21 1142 3,500 Units Given    iohexol (OMNIPAQUE) 350 MG/ML injection (mL) Total volume:  55 mL  Date/Time Rate/Dose/Volume Action   04/18/21 1158 55 mL Given    Sedation Time  Sedation Time Physician-1: 34 minutes 28 seconds   Contrast  Medication Name Total Dose  iohexol (OMNIPAQUE) 350 MG/ML injection 55 mL    Radiation/Fluoro  Fluoro time: 7.8 (min) DAP: 12278 (mGycm2) Cumulative Air Kerma: 123XX123 (mGy)   Complications   Complications documented before study signed (04/18/2021 12:16 PM)     RIGHT/LEFT HEART CATH AND CORONARY ANGIOGRAPHY  None Documented by Burnell Blanks, MD 04/18/2021 12:04 PM  Date Found:  04/18/2021  Time Range: Intraprocedure       Coronary Findings   Diagnostic Dominance: Right  Left Anterior Descending  Vessel is large.  Left Circumflex  Vessel is large.  Right Coronary Artery  Vessel is large.   Intervention   No interventions have been documented.  Coronary Diagrams   Diagnostic Dominance: Right    Intervention    Implants    No implant documentation for this case.    Syngo Images  Show images for CARDIAC CATHETERIZATION  Images on Long Term Storage  Show images for [place], Sipos to Procedure Log  Procedure Log     Hemo Data  Flowsheet Row Most Recent Value  Fick Cardiac Output 5.3 L/min  Fick Cardiac Output Index 2.86 (L/min)/BSA  Aortic Mean Gradient 11.79 mmHg  Aortic Peak Gradient 11 mmHg  Aortic Valve Area 1.87  Aortic Value Area Index 1.01 cm2/BSA  RA A Wave 2 mmHg  RA V Wave 1 mmHg  RA Mean 0 mmHg  RV Systolic Pressure 29 mmHg  RV Diastolic Pressure 0 mmHg  RV EDP 1 mmHg  PA Systolic Pressure 27 mmHg  PA Diastolic Pressure 10 mmHg  PA Mean 16 mmHg  PW A Wave 3 mmHg  PW V Wave 3 mmHg  PW Mean 2 mmHg  AO Systolic Pressure 123456 mmHg  AO Diastolic Pressure 58 mmHg  AO Mean 80 mmHg  LV Systolic Pressure Q000111Q mmHg  LV Diastolic Pressure 1 mmHg  LV EDP 3 mmHg  AOp Systolic Pressure A999333 mmHg  AOp Diastolic Pressure 61 mmHg  AOp Mean Pressure 86 mmHg  LVp Systolic Pressure [id number] mmHg  LVp Diastolic Pressure 1 mmHg  LVp EDP Pressure 4 mmHg  QP/QS 1  TPVR Index 5.6 HRUI  TSVR Index 28 HRUI  TPVR/TSVR Ratio 0.2     Cardiac TAVR CT  TECHNIQUE: The patient was scanned on a [employer]. A 90 kV retrospective scan was triggered in the descending thoracic aorta at 111 HU's. Gantry rotation speed was 250 msecs and collimation was .6 mm. No beta blockade or nitro were given. The 3D data set was reconstructed in 5% intervals of the R-R cycle. Systolic and diastolic phases were analyzed on a  dedicated work station using MPR, MIP and VRT modes. The patient received 100 cc of contrast.  FINDINGS: Image quality: Excellent.  Noise artifact is: Limited.  Valve Morphology: The aortic valve is tricuspid. The leaflets are severely calcified with diffuse calcifications. The [facility] is  immobile. The other leaflets demonstrate severely reduced motion in systole.  Aortic Valve Calcium score: 1485  Aortic annular dimension:  Phase assessed: 25%  Annular area: 658 mm2  Annular perimeter: 91.8 mm  Max diameter: 30.8 mm  Min diameter: 28.1 mm  Annular and subannular calcification: None.  Optimal coplanar projection: LAO 1 CAU 6  Coronary Artery Height above Annulus:  Left Main: 9.6 mm  Right Coronary: 21.1 mm  Sinus of Valsalva Measurements:  Non-coronary: 36 mm  Right-coronary: 36 mm  Left-coronary: 36 mm  Sinus of Valsalva Height:  Non-coronary: 24.6 mm  Right-coronary: 27.7 mm  Left-coronary: 21.7 mm  Sinotubular Junction: 34 mm  Ascending Thoracic Aorta: Diffuse ectasia noted, up to 42 mm (double-oblique)  Coronary Arteries: Normal coronary origin. Right dominance. The study was performed without use of NTG and is insufficient for plaque evaluation. Please refer to recent cardiac catheterization for coronary assessment. Mild coronary calcifications noted.  Cardiac Morphology:  Right Atrium: Right atrial size is within normal limits.  Right Ventricle: The right ventricular cavity is within normal limits.  Left Atrium: Left atrial size is normal in size with no left atrial appendage filling defect.  Left Ventricle: The ventricular cavity size is within normal limits. There are no stigmata of prior infarction. There is no abnormal filling defect. Normal left ventricular function, LVEF 56%. No regional wall motion abnormalities.  Pulmonary arteries: Normal in size without proximal filling defect.  Pulmonary  veins: Normal pulmonary venous drainage.  Pericardium: Normal thickness with no significant effusion or calcium present.  Mitral Valve: The mitral valve is normal structure without significant calcification.  Extra-cardiac findings: See attached radiology report for non-cardiac structures.  IMPRESSION: 1. Tricuspid aortic valve with severe diffuse calcifications.  2. Annular measurements appropriate for 29 mm Sapien 3 TAVR (658 mm2).  3. No significant annular or subannular calcifications.  4. Borderline left main coronary height (9.6 mm) but large aortic sinuses noted (36 mm).  5. Optimal Fluoroscopic Angle for Delivery: LAO 1 CAU 6.  6. Diffusely ectatic of the ascending aorta noted that is mildly dilated (42 mm double-oblique).  [place] T. Audie Box, MD   Electronically Signed   By: Eleonore Chiquito   On: 04/27/2021 14:22    CT ANGIOGRAPHY CHEST, ABDOMEN AND PELVIS  TECHNIQUE: Multidetector CT imaging through the chest, abdomen and pelvis was performed using the standard protocol during bolus administration of intravenous contrast. Multiplanar reconstructed images and MIPs were obtained and reviewed to evaluate the vascular anatomy.  CONTRAST:  55mL OMNIPAQUE IOHEXOL 350 MG/ML SOLN  COMPARISON:  Chest CTA 03/01/2019. No prior CT the abdomen or pelvis.  FINDINGS: CTA CHEST FINDINGS  Cardiovascular: Heart size is borderline enlarged. There is no significant pericardial fluid, thickening or pericardial calcification. There is aortic atherosclerosis, as well as atherosclerosis of the great vessels of the mediastinum and the coronary arteries, including calcified atherosclerotic plaque in the left anterior descending coronary artery. Severe thickening and calcification of the aortic valve. Ectasia of ascending thoracic aorta (4.0 cm in diameter).  Mediastinum/Lymph Nodes: No pathologically enlarged mediastinal or hilar lymph nodes. Esophagus is  unremarkable in appearance. No axillary lymphadenopathy.  Lungs/Pleura: Architectural distortion in the anterior aspect of the left upper lobe, similar to prior examinations, most compatible with areas of chronic post infectious or inflammatory scarring. No definite suspicious appearing pulmonary nodules or masses are noted. No acute consolidative airspace disease. No pleural effusions. Diffuse bronchial wall thickening with moderate centrilobular and paraseptal emphysema.  Musculoskeletal/Soft Tissues: There are no  aggressive appearing lytic or blastic lesions noted in the visualized portions of the skeleton.  CTA ABDOMEN AND PELVIS FINDINGS  Hepatobiliary: 1.3 x 0.9 cm low-attenuation lesion in segment 3 of the liver, compatible with a simple cyst. No other suspicious hepatic lesions. No intra or extrahepatic biliary ductal dilatation. Gallbladder is normal in appearance.  Pancreas: Pancreas is severely atrophic with extensive coarse parenchymal calcifications, indicative of chronic pancreatitis. No pancreatic mass. No peripancreatic fluid collections or inflammatory changes.  Spleen: Unremarkable.  Adrenals/Urinary Tract: 1.5 x 1.0 cm low-attenuation lesion in the anterior aspect of the upper pole the right kidney, compatible with a simple cyst. Left kidney and bilateral adrenal glands are normal in appearance. No hydroureteronephrosis. Urinary bladder is unremarkable in appearance.  Stomach/Bowel: Stomach is decompressed. Allowing for this, there is potential diffuse mural thickening throughout the stomach. No pathologic dilatation of small bowel or colon. The appendix is not confidently identified and may be surgically absent. Regardless, there are no inflammatory changes noted adjacent to the cecum to suggest the presence of an acute appendicitis at this time.  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 Vascular findings and measurements pertinent to potential TAVR procedure, as detailed below.  Reproductive: Prostate gland and seminal vesicles are unremarkable in appearance.  Other: No significant volume of ascites.  No pneumoperitoneum.  Musculoskeletal: There are no aggressive appearing lytic or blastic lesions noted in the visualized portions of the skeleton.  VASCULAR MEASUREMENTS PERTINENT TO TAVR:  AORTA:  Minimal Aortic Diameter-17 x 14 mm  Severity of Aortic Calcification-moderate to severe  RIGHT PELVIS:  Right Common Iliac Artery -  Minimal Diameter-10.9 x 7.0 mm  Tortuosity-mild  Calcification - severe  Right External Iliac Artery -  Minimal Diameter-5.9 x 4.3 mm  Tortuosity-severe  Calcification - severe  Right Common Femoral Artery -  Minimal Diameter-6.9 x 5.4 mm  Tortuosity-mild  Calcification - severe  LEFT PELVIS:  Left Common Iliac Artery -  Minimal Diameter-10.0 x 6.5 mm  Tortuosity-moderate  Calcification - severe  Left External Iliac Artery -  Minimal Diameter-completely occluded  Tortuosity-severe  Calcification-severe  Left Common Femoral Artery -  Minimal Diameter-completely occluded  Tortuosity-mild  Calcification - severe  Review of the MIP images confirms the above findings.  IMPRESSION: 1. 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 2. Severe thickening calcification of the aortic valve, compatible with reported clinical history  of severe aortic stenosis. 3. Aortic atherosclerosis, in addition to left anterior descending coronary artery disease. 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 4. Diffuse bronchial wall thickening with moderate centrilobular and paraseptal emphysema; imaging findings suggestive of underlying COPD. 5. Stigmata of chronic pancreatitis, as above. 6. Possible diffuse mural thickening throughout the stomach. Clinical correlation for gastritis is recommended. The possibility of infiltrative gastric neoplasm is not entirely excluded. Consideration for further evaluation with nonemergent endoscopy is recommended if clinically appropriate. 7. Additional incidental findings, as above.  Electronically Signed   By: Vinnie Langton M.D.   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  He describes gradual worsening of symptoms of exertional shortness of breath and fatigue consistent with chronic diastolic congestive heart failure, [place] Heart Association functional class IIb bordering on class III.  Some of his symptoms of shortness of breath may also be related to his underlying chronic lung disease with longstanding and ongoing tobacco abuse.  I have personally reviewed the patient's recent transthoracic echocardiogram, EKG, diagnostic cardiac catheterization, and CT angiograms.  Echocardiogram is notable for marginal acoustic windows but findings consistent with severe aortic stenosis.  The patient appears to have Sievers type I bicuspid aortic valve with severe calcification, thickening, and restricted leaflet mobility involving all 3 of the leaflets.  One of them appears essentially immobile.  Peak velocity  across aortic valve measured just over 3 m/s corresponding to mean transvalvular gradient estimated 22 mmHg but aortic valve area calculated only 0.72 cm.  The DVI was notably quite low at 0.23 and stroke-volume index only 24.  Left ventricular ejection fraction was reported 60 to 65% although I believe that number is generous and systolic function is probably mildly reduced.  Diagnostic cardiac catheterization confirmed the presence of aortic stenosis and revealed only mild nonobstructive coronary artery disease.  Right heart pressures were normal.  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  Options include conventional surgical aortic valve replacement with or without plication or graft replacement of the proximal aorta versus transcatheter aortic valve replacement.  However, the patient has underlying severe COPD and I am very concerned that he would not do well with conventional surgery.  CTA of the aorta and iliac vessels demonstrates both severe tortuosity and atherosclerotic disease.  Although it is conceivable that access could be obtained through the right femoral approach, I am concerned by the severity of tortuosity and atherosclerotic disease throughout and would favor alternative access via left subclavian approach.    Plan:  The patient and his close friend were counseled at length regarding treatment alternatives for management of severe symptomatic aortic stenosis. Alternative approaches such as conventional aortic valve replacement, transcatheter aortic valve replacement, and continued medical therapy without intervention were compared and contrasted at length.  The risks associated with conventional surgical  aortic valve replacement were discussed in detail, as were expectations for post-operative convalescence, and why I would be reluctant to consider this patient a candidate for conventional surgery.  Issues specific to transcatheter aortic valve replacement were discussed including questions about long term valve durability, the potential for paravalvular leak, possible increased risk of need for permanent pacemaker placement, and other technical complications related to the procedure itself.  We also discussed the presence of significant aortoiliac disease and the possible need for alternative access to facilitate transcatheter valve replacement.  Long-term prognosis with medical therapy was discussed. This discussion was placed in the context of the patient's own specific clinical presentation and past medical history.  All of their questions have been addressed.  The patient is interested in proceeding with transcatheter aortic valve replacement via left subclavian approach in the near future.  This will be scheduled once dental extraction has been completed.  Following  the decision to proceed with transcatheter aortic valve replacement, a discussion has been held regarding what types of management strategies would be attempted intraoperatively in the event of life-threatening complications, including whether or not the patient would be considered a candidate for the use of cardiopulmonary bypass and/or conversion to open sternotomy for attempted surgical intervention.  The patient specifically requests that should a potentially life-threatening complication develop we would attempt emergency median sternotomy and/or other aggressive surgical procedures.  The patient has been advised of a variety of complications that might develop including but not limited to risks of death, stroke, paravalvular leak, aortic dissection or other major vascular complications, aortic annulus rupture, device embolization, cardiac  rupture or perforation, mitral regurgitation, acute myocardial infarction, arrhythmia, heart block or bradycardia requiring permanent pacemaker placement, congestive heart failure, respiratory failure, renal failure, pneumonia, infection, other late complications related to structural valve deterioration or migration, or other complications that might ultimately cause a temporary or permanent loss of functional independence or other long term morbidity.  The patient provides full informed consent for the procedure as described and all questions were answered.      I spent in excess of 90 minutes during the conduct of this office consultation and >50% of this time involved direct face-to-face encounter with the patient for counseling and/or coordination of their care.     Valentina Gu. Roxy Manns, MD 05/14/2021 3:56 PM

## 2021-05-22 ENCOUNTER — Other Ambulatory Visit: Payer: Self-pay

## 2021-05-22 ENCOUNTER — Encounter (HOSPITAL_COMMUNITY): Payer: Self-pay | Admitting: Dentistry

## 2021-05-22 NOTE — Progress Notes (Signed)
Mr. Hovater denies chest pain or shortness of breath. Patient denies any s/s of Covid in his household. Mr. Schumpert states that no one in his household has been exposed to anyone with Covid to their knowledge. If patient has no symptoms of Covid on arrival 05/24/21, patient will not need to be tested for Covid.  Mr. Arth has  Type II diabetes.  Patient reports that CBGs run 100- 170.  I instructed Mr. Lemler to not take Metformin the morning of surgery.  I instructed patient to check CBG after awaking and every 2 hours until arrival  to the hospital. I Instructed patient if CBG is less than 70 to  drink 1/2 cup of a clear juice. Recheck CBG in 15 minutes if CBG is not over 70 call, pre- op desk at [phone] for further instructions. I

## 2021-05-24 ENCOUNTER — Encounter (HOSPITAL_COMMUNITY)
Admission: RE | Disposition: A | Discharge status: Home / Self Care | Payer: Self-pay | Source: Home / Self Care | Attending: Dentistry

## 2021-05-24 ENCOUNTER — Ambulatory Visit (HOSPITAL_COMMUNITY): Payer: Medicare Other | Admitting: Anesthesiology

## 2021-05-24 ENCOUNTER — Encounter (HOSPITAL_COMMUNITY): Payer: Self-pay | Admitting: Dentistry

## 2021-05-24 ENCOUNTER — Ambulatory Visit (HOSPITAL_COMMUNITY)
Admission: RE | Admit: 2021-05-24 | Discharge: 2021-05-24 | Disposition: A | Discharge status: Home / Self Care | Payer: Medicare Other | Attending: Dentistry | Admitting: Dentistry

## 2021-05-24 DIAGNOSIS — J449 Chronic obstructive pulmonary disease, unspecified: Secondary | ICD-10-CM | POA: Diagnosis not present

## 2021-05-24 DIAGNOSIS — F17210 Nicotine dependence, cigarettes, uncomplicated: Secondary | ICD-10-CM | POA: Insufficient documentation

## 2021-05-24 DIAGNOSIS — Z79899 Other long term (current) drug therapy: Secondary | ICD-10-CM | POA: Diagnosis not present

## 2021-05-24 DIAGNOSIS — Z7982 Long term (current) use of aspirin: Secondary | ICD-10-CM | POA: Diagnosis not present

## 2021-05-24 DIAGNOSIS — E1151 Type 2 diabetes mellitus with diabetic peripheral angiopathy without gangrene: Secondary | ICD-10-CM | POA: Insufficient documentation

## 2021-05-24 DIAGNOSIS — Z91018 Allergy to other foods: Secondary | ICD-10-CM | POA: Diagnosis not present

## 2021-05-24 DIAGNOSIS — K056 Periodontal disease, unspecified: Secondary | ICD-10-CM | POA: Diagnosis not present

## 2021-05-24 DIAGNOSIS — I10 Essential (primary) hypertension: Secondary | ICD-10-CM | POA: Insufficient documentation

## 2021-05-24 DIAGNOSIS — Z7984 Long term (current) use of oral hypoglycemic drugs: Secondary | ICD-10-CM | POA: Insufficient documentation

## 2021-05-24 DIAGNOSIS — K219 Gastro-esophageal reflux disease without esophagitis: Secondary | ICD-10-CM | POA: Diagnosis not present

## 2021-05-24 DIAGNOSIS — K083 Retained dental root: Secondary | ICD-10-CM

## 2021-05-24 DIAGNOSIS — K029 Dental caries, unspecified: Secondary | ICD-10-CM

## 2021-05-24 DIAGNOSIS — I350 Nonrheumatic aortic (valve) stenosis: Secondary | ICD-10-CM | POA: Diagnosis not present

## 2021-05-24 HISTORY — DX: Cardiac murmur, unspecified: R01.1

## 2021-05-24 HISTORY — DX: Pneumonia, unspecified organism: J18.9

## 2021-05-24 HISTORY — DX: Unspecified osteoarthritis, unspecified site: M19.90

## 2021-05-24 HISTORY — DX: Personal history of other endocrine, nutritional and metabolic disease: Z86.39

## 2021-05-24 HISTORY — PX: MULTIPLE EXTRACTIONS WITH ALVEOLOPLASTY: SHX5342

## 2021-05-24 HISTORY — DX: Dyspnea, unspecified: R06.00

## 2021-05-24 LAB — GLUCOSE, CAPILLARY
Glucose-Capillary: 124 mg/dL — ABNORMAL HIGH (ref 70–99)
Glucose-Capillary: 147 mg/dL — ABNORMAL HIGH (ref 70–99)

## 2021-05-24 LAB — CBC
HCT: 45.7 % (ref 39.0–52.0)
Hemoglobin: 14.1 g/dL (ref 13.0–17.0)
MCH: 32.2 pg (ref 26.0–34.0)
MCHC: 30.9 g/dL (ref 30.0–36.0)
MCV: 104.3 fL — ABNORMAL HIGH (ref 80.0–100.0)
Platelets: 209 10*3/uL (ref 150–400)
RBC: 4.38 MIL/uL (ref 4.22–5.81)
RDW: 13.1 % (ref 11.5–15.5)
WBC: 9.6 10*3/uL (ref 4.0–10.5)
nRBC: 0 % (ref 0.0–0.2)

## 2021-05-24 SURGERY — MULTIPLE EXTRACTION WITH ALVEOLOPLASTY
Anesthesia: General | Site: Mouth

## 2021-05-24 MED ORDER — OXYMETAZOLINE HCL 0.05 % NA SOLN
NASAL | Status: AC
  Filled 2021-05-24: qty 30

## 2021-05-24 MED ORDER — SUCCINYLCHOLINE CHLORIDE 200 MG/10ML IV SOSY
PREFILLED_SYRINGE | INTRAVENOUS | Status: DC | PRN
  Administered 2021-05-24: 140 mg via INTRAVENOUS

## 2021-05-24 MED ORDER — 0.9 % SODIUM CHLORIDE (POUR BTL) OPTIME
TOPICAL | Status: DC | PRN
  Administered 2021-05-24: 1000 mL

## 2021-05-24 MED ORDER — CHLORHEXIDINE GLUCONATE 0.12 % MT SOLN
15.0000 mL | OROMUCOSAL | Status: AC
  Administered 2021-05-24: 15 mL via OROMUCOSAL
  Filled 2021-05-24: qty 15

## 2021-05-24 MED ORDER — ROCURONIUM BROMIDE 10 MG/ML (PF) SYRINGE
PREFILLED_SYRINGE | INTRAVENOUS | Status: AC
  Filled 2021-05-24: qty 10

## 2021-05-24 MED ORDER — DEXAMETHASONE SODIUM PHOSPHATE 10 MG/ML IJ SOLN
INTRAMUSCULAR | Status: DC | PRN
  Administered 2021-05-24: 5 mg via INTRAVENOUS

## 2021-05-24 MED ORDER — DEXAMETHASONE SODIUM PHOSPHATE 10 MG/ML IJ SOLN
INTRAMUSCULAR | Status: AC
  Filled 2021-05-24: qty 1

## 2021-05-24 MED ORDER — LACTATED RINGERS IV SOLN: INTRAVENOUS | Status: DC

## 2021-05-24 MED ORDER — LIDOCAINE 2% (20 MG/ML) 5 ML SYRINGE
INTRAMUSCULAR | Status: AC
  Filled 2021-05-24: qty 5

## 2021-05-24 MED ORDER — ONDANSETRON HCL 4 MG/2ML IJ SOLN
INTRAMUSCULAR | Status: AC
  Filled 2021-05-24: qty 2

## 2021-05-24 MED ORDER — ROCURONIUM BROMIDE 10 MG/ML (PF) SYRINGE
PREFILLED_SYRINGE | INTRAVENOUS | Status: DC | PRN
  Administered 2021-05-24: 10 mg via INTRAVENOUS

## 2021-05-24 MED ORDER — PHENYLEPHRINE HCL (PRESSORS) 10 MG/ML IV SOLN
INTRAVENOUS | Status: AC
  Filled 2021-05-24: qty 1

## 2021-05-24 MED ORDER — AMOXICILLIN 500 MG PO CAPS: 500.0000 mg | ORAL_CAPSULE | Freq: Three times a day (TID) | ORAL | 0 refills | Status: AC

## 2021-05-24 MED ORDER — PROPOFOL 10 MG/ML IV BOLUS
INTRAVENOUS | Status: AC
  Filled 2021-05-24: qty 20

## 2021-05-24 MED ORDER — ONDANSETRON HCL 4 MG/2ML IJ SOLN
INTRAMUSCULAR | Status: DC | PRN
  Administered 2021-05-24: 4 mg via INTRAVENOUS

## 2021-05-24 MED ORDER — MIDAZOLAM HCL 2 MG/2ML IJ SOLN
INTRAMUSCULAR | Status: DC | PRN
  Administered 2021-05-24: 2 mg via INTRAVENOUS

## 2021-05-24 MED ORDER — CEFAZOLIN SODIUM-DEXTROSE 2-4 GM/100ML-% IV SOLN
2.0000 g | INTRAVENOUS | Status: AC
  Administered 2021-05-24: 2 g via INTRAVENOUS
  Filled 2021-05-24: qty 100

## 2021-05-24 MED ORDER — LIDOCAINE 2% (20 MG/ML) 5 ML SYRINGE
INTRAMUSCULAR | Status: DC | PRN
  Administered 2021-05-24: 60 mg via INTRAVENOUS

## 2021-05-24 MED ORDER — LIDOCAINE-EPINEPHRINE 2 %-1:100000 IJ SOLN
INTRAMUSCULAR | Status: AC
  Filled 2021-05-24: qty 6.8

## 2021-05-24 MED ORDER — ACETAMINOPHEN 500 MG PO TABS
1000.0000 mg | ORAL_TABLET | Freq: Once | ORAL | Status: AC
  Administered 2021-05-24: 1000 mg via ORAL
  Filled 2021-05-24: qty 2

## 2021-05-24 MED ORDER — MIDAZOLAM HCL 2 MG/2ML IJ SOLN
INTRAMUSCULAR | Status: AC
  Filled 2021-05-24: qty 2

## 2021-05-24 MED ORDER — LIDOCAINE-EPINEPHRINE 2 %-1:100000 IJ SOLN
INTRAMUSCULAR | Status: DC | PRN
  Administered 2021-05-24: 3.4 mL via INTRADERMAL

## 2021-05-24 MED ORDER — FENTANYL CITRATE (PF) 250 MCG/5ML IJ SOLN
INTRAMUSCULAR | Status: DC | PRN
  Administered 2021-05-24: 50 ug via INTRAVENOUS

## 2021-05-24 MED ORDER — PHENYLEPHRINE HCL-NACL 10-0.9 MG/250ML-% IV SOLN
INTRAVENOUS | Status: DC | PRN
  Administered 2021-05-24: 60 ug/min via INTRAVENOUS

## 2021-05-24 MED ORDER — BUPIVACAINE-EPINEPHRINE (PF) 0.5% -1:200000 IJ SOLN
INTRAMUSCULAR | Status: AC
  Filled 2021-05-24: qty 3.6

## 2021-05-24 MED ORDER — HYDROCODONE-ACETAMINOPHEN 5-325 MG PO TABS: 1.0000 | ORAL_TABLET | Freq: Four times a day (QID) | ORAL | 0 refills | Status: DC | PRN

## 2021-05-24 MED ORDER — PROPOFOL 10 MG/ML IV BOLUS
INTRAVENOUS | Status: DC | PRN
  Administered 2021-05-24: 150 mg via INTRAVENOUS

## 2021-05-24 MED ORDER — FENTANYL CITRATE (PF) 250 MCG/5ML IJ SOLN
INTRAMUSCULAR | Status: AC
  Filled 2021-05-24: qty 5

## 2021-05-24 MED ORDER — FENTANYL CITRATE (PF) 100 MCG/2ML IJ SOLN: 25.0000 ug | INTRAMUSCULAR | Status: DC | PRN

## 2021-05-24 SURGICAL SUPPLY — 32 items
ALCOHOL 70% 16 OZ (MISCELLANEOUS) ×2 IMPLANT
BLADE SURG 15 STRL LF DISP TIS (BLADE) ×1 IMPLANT
BLADE SURG 15 STRL SS (BLADE) ×1
COVER SURGICAL LIGHT HANDLE (MISCELLANEOUS) ×2 IMPLANT
COVER WAND RF STERILE (DRAPES) ×2 IMPLANT
GAUZE 4X4 16PLY RFD (DISPOSABLE) ×2 IMPLANT
GAUZE PACKING FOLDED 2  STR (GAUZE/BANDAGES/DRESSINGS) ×1
GAUZE PACKING FOLDED 2 STR (GAUZE/BANDAGES/DRESSINGS) ×1 IMPLANT
GLOVE SURG ENC MOIS LTX SZ6.5 (GLOVE) ×2 IMPLANT
GLOVE SURG POLYISO LF SZ6 (GLOVE) ×2 IMPLANT
GOWN STRL REUS W/ TWL LRG LVL3 (GOWN DISPOSABLE) ×2 IMPLANT
GOWN STRL REUS W/TWL LRG LVL3 (GOWN DISPOSABLE) ×2
KIT BASIN OR (CUSTOM PROCEDURE TRAY) ×2 IMPLANT
KIT TURNOVER KIT B (KITS) ×2 IMPLANT
MANIFOLD NEPTUNE II (INSTRUMENTS) ×2 IMPLANT
NEEDLE BLUNT 16X1.5 OR ONLY (NEEDLE) ×2 IMPLANT
NS IRRIG 1000ML POUR BTL (IV SOLUTION) ×2 IMPLANT
PACK EENT II TURBAN DRAPE (CUSTOM PROCEDURE TRAY) ×2 IMPLANT
PAD ARMBOARD 7.5X6 YLW CONV (MISCELLANEOUS) ×2 IMPLANT
SPONGE SURGIFOAM ABS GEL 100 (HEMOSTASIS) IMPLANT
SPONGE SURGIFOAM ABS GEL 12-7 (HEMOSTASIS) IMPLANT
SPONGE SURGIFOAM ABS GEL SZ50 (HEMOSTASIS) IMPLANT
SUCTION FRAZIER HANDLE 10FR (MISCELLANEOUS) ×1
SUCTION TUBE FRAZIER 10FR DISP (MISCELLANEOUS) ×1 IMPLANT
SUT CHROMIC 3 0 PS 2 (SUTURE) ×2 IMPLANT
SYR 50ML SLIP (SYRINGE) ×2 IMPLANT
SYR BULB IRRIG 60ML STRL (SYRINGE) ×2 IMPLANT
TOWEL GREEN STERILE FF (TOWEL DISPOSABLE) ×2 IMPLANT
TUBE CONNECTING 12X1/4 (SUCTIONS) ×2 IMPLANT
WATER STERILE IRR 1000ML POUR (IV SOLUTION) ×2 IMPLANT
WATER TABLETS ICX (MISCELLANEOUS) ×2 IMPLANT
YANKAUER SUCT BULB TIP NO VENT (SUCTIONS) ×2 IMPLANT

## 2021-05-24 NOTE — Transfer of Care (Signed)
Immediate Anesthesia Transfer of Care Note  Patient: Patrick Beck  Procedure(s) Performed: EXTRACTION OF TEETH NUMBER TWO, THREE, FIVE, SIX, SEVEN, EIGHT, NINE, TEN, ELEVEN, TWELVE, SEVENTEEN, EIGHTTEEN, NINETEEN, TWENTY, TWENTY-ONE, TWENTY- TWO, TWENTY-FOUR, THWENTY-SEVEN, TWENTY-EIGHT, TWENTY-NINE WITH ALVEOLOPLASTY IN ALL FOUR QUADRANTS (N/A Mouth)  Patient Location: PACU  Anesthesia Type:General  Level of Consciousness: drowsy, patient cooperative and responds to stimulation  Airway & Oxygen Therapy: Patient Spontanous Breathing and Patient connected to nasal cannula oxygen  Post-op Assessment: Report given to RN, Post -op Vital signs reviewed and stable and Patient moving all extremities X 4  Post vital signs: Reviewed and stable  Last Vitals:  Vitals Value Taken Time  BP 179/87 05/24/21 1102  Temp    Pulse 89 05/24/21 1102  Resp 19 05/24/21 1102  SpO2 96 % 05/24/21 1102  Vitals shown include unvalidated device data.  Last Pain:  Vitals:   05/24/21 0740  TempSrc:   PainSc: 0-No pain      Patients Stated Pain Goal: 0 (09/32/67 1245)  Complications: No complications documented.

## 2021-05-24 NOTE — Discharge Instructions (Signed)
[facility] Caeden Foots B. Vianey Caniglia, D.M.D. Phone: [phone] Fax: [phone]    MOUTH CARE AFTER SURGERY   FACTS: Ice used in ice bag helps keep the swelling down, and can help lessen the pain. It is easier to treat pain BEFORE it happens. Spitting disturbs the clot and may cause bleeding to start again, or to get worse. Smoking delays healing and can cause complications. Sharing prescriptions can be dangerous.  Do not take medications not recently prescribed for you. Antibiotics may stop birth control pills from working.  Use other means of birth control while on antibiotics. Warm salt water rinses after the first 24 hours will help lessen the swelling:  Use 1/2 teaspoonful of table salt per oz.of water.  DO NOT: Do not spit.   Do not drink through a straw. Strongly advised not to smoke, dip snuff or chew tobacco at least for 3 days. Do not eat sharp or crunchy foods.  Avoid the area of surgery when chewing. Do not stop your antibiotics before your instructions say to do so. Do not eat hot foods until bleeding has stopped.  If you need to, let your food cool down to room temperature.  EXPECT: Some swelling, especially first 2-3 days. Soreness or discomfort in varying degrees.  Follow your dentist's instructions about how to handle pain before it starts. Pinkish saliva or light blood in saliva, or on your pillow in the morning.  This can last around 24 hours. Bruising inside or outside the mouth.  This may not show up until 2-3 days after surgery.  Don't worry, it will go away in time. Pieces of "bone" may work themselves loose.  It's OK.  If they bother you, let us know.    WHAT TO DO IMMEDIATELY AFTER SURGERY: Bite on gauze with steady pressure for 30-45 minutes at a time.  Switch out the gauze after 30-45 minutes for clean gauze, and continue this for 1-2 hours or until bleeding subsides. Do not chew on the gauze. Do not lie down flat.  Raise  your head support especially for the first 24 hours. Apply ice to your face on the side of the surgery.  You may apply it 20 minutes on and a few minutes off.  Ice for 8-12 hours.  You may use ice up to 24 hours. Before the numbness wears off, take a pain pill as instructed. Prescription pain medication is not always required.  SWELLING: Expect swelling for the first couple of days.  It should get better after that. If swelling increases 3 days or so after surgery, let us know as soon as possible.  FEVER: Take Tylenol every 4 hours if needed to lower your temperature, especially if it is at 100F or higher. Drink lots of fluids. If the fever does not go away, let us know.  BREATHING TROUBLE: Any unusual difficulty breathing means you have to have someone bring you to the emergency room ASAP.  BLEEDING: Light oozing is expected for 24 hours or so. Prop head up with pillows. Do not spit. Do not confuse bright red fresh flowing blood with lots of saliva colored with a little bit of blood. If you notice some bleeding, place gauze or a tea bag where it is bleeding and apply CONSTANT pressure by biting down for 1 hour.  Avoid talking during this time.  Do not remove the gauze or tea bag during this hour to "check" the bleeding. If you notice bright RED bleeding FLOWING out   of particular area, and filling the floor of your mouth, put a wad of gauze on that area, bite down firmly and constantly.  Call us immediately.  If we're closed, have someone bring you to the emergency room.  ORAL HYGIENE: Brush your teeth as usual after meals and before bedtime. Use a soft toothbrush around the area of surgery. DO NOT AVOID BRUSHING.  Otherwise bacteria(germs) will grow and may delay healing or encourage infection. Since you cannot spit, just gently rinse and let the water flow out of your mouth. DO NOT SWISH HARD.  EATING: Cool liquids are a good point to start.  Increase to soft foods as  tolerated.   PRESCRIPTIONS: Follow the directions for your prescriptions exactly as written. If your doctor gave you a narcotic pain medication, do not drive, operate machinery or drink alcohol when on that medication.   QUESTIONS? Call our office during office hours [phone] or call the Emergency Room at [phone].  

## 2021-05-24 NOTE — Progress Notes (Signed)
   Department of Dental Medicine    PREOPERATIVE NOTE  SERVICE DATE:   05/24/2021  PATIENT'S NAME:   Patrick Beck MEDICAL RECORD NUMBER:  [id number]   . Patrick Beck presents today for dental procedures in the operating room. . The patient denies any acute medical or dental changes and agrees to proceed with treatment as planned.   VITALS: BP (!) 182/96   Pulse 83   Temp 98.1 F (36.7 C) (Oral)   Resp 18   Ht 6' (1.829 m)   Wt 63.5 kg   SpO2 95%   BMI 18.99 kg/m     LABS: Lab Results  Component Value Date   WBC 9.6 05/24/2021   HGB 14.1 05/24/2021   HCT 45.7 05/24/2021   MCV 104.3 (H) 05/24/2021   PLT 209 05/24/2021   BMET    Component Value Date/Time   NA 139 04/18/2021 1142   NA 138 04/04/2021 1233   K 4.5 04/18/2021 1142   CL 101 04/04/2021 1233   CO2 24 04/04/2021 1233   GLUCOSE 169 (H) 04/04/2021 1233   GLUCOSE 154 (H) 10/24/2017 0450   BUN 11 04/04/2021 1233   CREATININE 0.78 04/04/2021 1233   CALCIUM 8.6 04/04/2021 1233   GFRNONAA >60 10/24/2017 0450   GFRAA >60 10/24/2017 0450    No results found for: INR, PROTIME No results found for: PTT   EXAM: . No signs of acute dental changes.   ASSESSMENT: 1. Dental caries 2. Chronic periodontal disease   PLAN: . The patient agrees to proceed with dental treatment in the operating room as previously discussed and planned, and accepts the risks, benefits and complications of the proposed treatment.   . The patient is aware of the risk for bleeding, bruising, swelling, infection, pain, nerve damage, sinus involvement, root tip fracture, mandible fracture and the risks of complications associated with the anesthesia.  The patient also is aware of the potential for other complications not mentioned above.   [place] Benson Norway, D.M.D.

## 2021-05-24 NOTE — Anesthesia Preprocedure Evaluation (Addendum)
Anesthesia Evaluation  Patient identified by MRN, date of birth, ID band Patient awake    Reviewed: Allergy & Precautions, NPO status , Patient's Chart, lab work & pertinent test results  Airway Mallampati: II  TM Distance: >3 FB Neck ROM: Full    Dental  (+) Loose, Missing, Dental Advisory Given, Poor Dentition,    Pulmonary shortness of breath, COPD,  COPD inhaler, Current SmokerPatient did not abstain from smoking.,    Pulmonary exam normal breath sounds clear to auscultation       Cardiovascular hypertension, + Peripheral Vascular Disease  Normal cardiovascular exam+ Valvular Problems/Murmurs AS  Rhythm:Regular Rate:Normal + Systolic murmurs TTE 1245 1. Images are limited.  2. Left ventricular ejection fraction, by estimation, is 60 to 65%. The  left ventricle has normal function. Left ventricular endocardial border  not optimally defined to evaluate regional wall motion. There is mild left  ventricular hypertrophy. Left  ventricular diastolic parameters are indeterminate.  3. Right ventricular systolic function is normal. The right ventricular  size is normal. Tricuspid regurgitation signal is inadequate for assessing  PA pressure.  4. The mitral valve is grossly normal. Trivial mitral valve  regurgitation.  5. The aortic valve is likely functionally bicuspid. There is severe  calcifcation of the aortic valve. Aortic valve regurgitation is trivial.  Probable severe aortic valve stenosis based on leallet motion/appearance  as well as dimentionless index 0.23.  Aortic valve mean gradient measures 22 mmHg. Aortic valve Vmax measures  3.02 m/s.  6. The inferior vena cava is normal in size with greater than 50%  respiratory variability, suggesting right atrial pressure of 3 mmHg.   Neuro/Psych negative neurological ROS  negative psych ROS   GI/Hepatic Neg liver ROS, hiatal hernia, GERD  ,  Endo/Other  negative  endocrine ROSdiabetes, Type 2, Oral Hypoglycemic Agents  Renal/GU negative Renal ROS  negative genitourinary   Musculoskeletal  (+) Arthritis ,   Abdominal   Peds  Hematology negative hematology ROS (+)   Anesthesia Other Findings   Reproductive/Obstetrics                            Anesthesia Physical Anesthesia Plan  ASA: IV  Anesthesia Plan: General   Post-op Pain Management:    Induction: Intravenous  PONV Risk Score and Plan: 1 and Dexamethasone and Ondansetron  Airway Management Planned: Nasal ETT  Additional Equipment:   Intra-op Plan:   Post-operative Plan: Extubation in OR  Informed Consent: I have reviewed the patients History and Physical, chart, labs and discussed the procedure including the risks, benefits and alternatives for the proposed anesthesia with the patient or authorized representative who has indicated his/her understanding and acceptance.     Dental advisory given  Plan Discussed with: CRNA  Anesthesia Plan Comments: (Plan to use Clearsight for BP monitoring.)        Anesthesia Quick Evaluation

## 2021-05-24 NOTE — Interval H&P Note (Signed)
History and Physical Interval Note:  05/24/2021 9:10 AM  Patrick Beck has presented today for dental surgery, with the diagnosis of caries.  The various methods of treatment have been discussed with the patient and family.  After consideration of risks, benefits and other options for treatment, the patient has consented to  Procedure(s): MULTIPLE EXTRACTION WITH ALVEOLOPLASTY (N/A) as a surgical intervention.  The patient's history has been reviewed, patient examined, no change in status and stable for surgery.  I have reviewed the patient's chart and labs.  Questions were answered to the patient's satisfaction.     [name]

## 2021-05-24 NOTE — Anesthesia Procedure Notes (Signed)
Procedure Name: Intubation Date/Time: 05/24/2021 9:29 AM Performed by: Michele Rockers, CRNA Pre-anesthesia Checklist: Patient identified, Emergency Drugs available, Suction available and Patient being monitored Patient Re-evaluated:Patient Re-evaluated prior to induction Oxygen Delivery Method: Circle system utilized Preoxygenation: Pre-oxygenation with 100% oxygen Induction Type: IV induction Ventilation: Mask ventilation without difficulty Laryngoscope Size: Miller and 2 Grade View: Grade I Nasal Tubes: Nasal prep performed, Nasal Rae and Left Tube size: 7.5 mm Placement Confirmation: ETT inserted through vocal cords under direct vision,  positive ETCO2 and breath sounds checked- equal and bilateral Tube secured with: Tape Dental Injury: Teeth and Oropharynx as per pre-operative assessment

## 2021-05-24 NOTE — Anesthesia Postprocedure Evaluation (Signed)
Anesthesia Post Note  Patient: Patrick Beck  Procedure(s) Performed: EXTRACTION OF TEETH NUMBER TWO, THREE, FIVE, SIX, SEVEN, EIGHT, NINE, TEN, ELEVEN, TWELVE, SEVENTEEN, EIGHTTEEN, NINETEEN, TWENTY, TWENTY-ONE, TWENTY- TWO, TWENTY-FOUR, THWENTY-SEVEN, TWENTY-EIGHT, TWENTY-NINE WITH ALVEOLOPLASTY IN ALL FOUR QUADRANTS (N/A Mouth)     Patient location during evaluation: PACU Anesthesia Type: General Level of consciousness: awake and alert Pain management: pain level controlled Vital Signs Assessment: post-procedure vital signs reviewed and stable Respiratory status: spontaneous breathing, nonlabored ventilation, respiratory function stable and patient connected to nasal cannula oxygen Cardiovascular status: blood pressure returned to baseline and stable Postop Assessment: no apparent nausea or vomiting Anesthetic complications: no   No complications documented.  Last Vitals:  Vitals:   05/24/21 1115 05/24/21 1130  BP: (!) 156/84 120/80  Pulse: 81 79  Resp: (!) 21 19  Temp:  36.6 C  SpO2: 95% 93%    Last Pain:  Vitals:   05/24/21 1130  TempSrc:   PainSc: 0-No pain                 Trent Theisen L Lamorris Knoblock

## 2021-05-24 NOTE — Op Note (Signed)
Department of Dental Medicine   OPERATIVE REPORT   DATE OF SURGERY:   05/24/2021  PATIENT'S NAME:   Patrick Beck DATE OF BIRTH:   07-29-1946 MEDICAL RECORD NUMBER: [id number]  SURGEON:   Elner Seifert B. Benson Norway, D.M.D.  ASSISTANT:  Molli Posey, DAII  PREOPERATIVE DIAGNOSES:  Dental caries, periodontal disease  POSTOPERATIVE DIAGNOSES:  Dental caries, periodontal disease  Patient Active Problem List   Diagnosis Date Noted  . Nonrheumatic aortic valve stenosis   . Cavitary pneumonia 10/22/2017  . COPD (chronic obstructive pulmonary disease) ([place]) 10/22/2017  . Tobacco dependence 10/22/2017  . Type 2 diabetes mellitus without complication ([place]) 88/41/6606  . Essential hypertension 10/22/2017  . Glaucoma 10/22/2017    PROCEDURES PERFORMED: 1. Extractions of teeth numbers 2, 3, 5, 6, 7, 8, 9, 10, 11, 12, 17, 18, 19, 20, 21, 22, 24, 26, 27, 28 and 29 2. 4 quadrants of alveoloplasty (UR, LR, UL and LL)  ANESTHESIA:  General anesthesia via nasal endotracheal tube.  MEDICATIONS: 1. Ancef 2 g IV prior to invasive dental procedures. 2. Local anesthesia with a total utilization of 2 cartridges of 34 mg of lidocaine with 0.018 mg of epinephrine/ea.  SPECIMENS:  21 teeth that were extracted and discarded  DRAINS/CULTURES:  Purulence evident and subsequently drained from sockets of teeth numbers 7, 10 and 12  COMPLICATIONS:  None  ESTIMATED BLOOD LOSS:  5 mL  INTRAVENOUS FLUIDS:  10 mL of Lactated ringers solution  INDICATIONS:  The patient was recently diagnosed with severe aortic stenosis.  A medically necessary dental consult was then requested to evaluate the patient for any dental/orofacial infection and their overall oral health.  The patient was examined and subsequently treatment planned for full mouth extractions due to extent of chronic infection, decay and periodontal disease.  This treatment plan was made to decrease the perioperative and postoperative risks and  complications associated with dental/orofacial infection from affecting the patient's systemic health.  OPERATIVE FINDINGS:  The patient was examined in operating room number 8.  The indicated teeth were identified and verified for extraction. The patient was noted be affected by severe dental decay and periodontal disease.  DESCRIPTION OF PROCEDURE:  The patient was identified in the holding area and brought to the main operating room number 8 by the anesthesia team. The patient was then placed in the supine position on the operating table.  General anesthesia was then induced per the anesthesia team. The patient was then prepped and draped in the usual sterile fashion for dental medicine procedures.  A timeout was performed. The patient was identified and procedures were verified. A throat pack was placed at this time. The oral cavity was then thoroughly examined with the findings noted above. The patient was then ready for the dental medicine procedure as follows:   ANESTHESIA: Local anesthesia was administered sequentially with a total utilization of 2 cartridges each containing 34 mg of lidocaine with 0.018 mg of epinephrine.  Location of anesthesia included upper right and upper left quadrants: infiltration, palatal and nasopalatine block; lower right and lower left quadrants infiltration, mental nerve blocks and lingual.  ROUTINE EXTRACTIONS: The maxillary left and right quadrants were first approached. The teeth were then subluxated with a series of straight elevators.  Teeth numbers 2, 3, 5, 6, 7, 8, 9, 10, 11 and 12 were then removed with a 150 forceps and rongeurs without complications.  Teeth numbers 7, 10 and 12 were noted to have purulent drainage upon extractions and subsequently curetted and irrigated  until no further drainage was observed.  Alveoloplasty was then performed utilizing a ronguers and bone file.  The tissues were approximated and trimmed appropriately to help achieve primary  closure.  The surgical sites were then irrigated with copious amounts of sterile saline.  The surgical sites were closed using 3-0 chromic gut sutures as follows: 1 figure-8 style and 2 simple interrupted style sutures.  The mandibular left and right quadrants were then approached. The teeth were subluxated with a series of straight elevators.  Teeth numbers 17, 18, 19, 20, 21, 22, 24, 26, 27, 28 and 29 were then removed utilizing a 151 forceps and rongeurs without complications.  Alveoloplasty was then performed utilizing a rongeurs and bone file. The tissues were approximated and trimmed appropriately to help achieve primary closure. The surgical sites were then irrigated with copious amounts of sterile saline.  The surgical sites were closed using 3-0 chromic gut sutures as follows: 2 figure-8 style and 2 simple interrupted style sutures.   END OF PROCEDURE: Thorough oral irrigation with sterile saline was performed.  Good hemostasis was observed.  The patient was examined for complications, and seeing none, the dental medicine procedure was deemed to be complete.  The throat pack was removed at this time. An oral airway was then placed at the request of the anesthesia team.  A series of 4x4 gauze were placed in the mouth to aid hemostasis as needed.  The patient was then handed over to the anesthesia team for final disposition.  After an appropriate amount of time, the patient was extubated and taken to the postanesthsia care unit in stable condition.  All counts were correct for the dental medicine procedure.     [place] Benson [place], D.M.D.

## 2021-05-25 ENCOUNTER — Other Ambulatory Visit: Payer: Self-pay | Admitting: Dentistry

## 2021-05-25 ENCOUNTER — Encounter (HOSPITAL_COMMUNITY): Payer: Self-pay | Admitting: Dentistry

## 2021-05-25 MED ORDER — HYDROCODONE-ACETAMINOPHEN 5-325 MG PO TABS: 1.0000 | ORAL_TABLET | Freq: Four times a day (QID) | ORAL | 0 refills | Status: AC | PRN

## 2021-06-01 ENCOUNTER — Ambulatory Visit (INDEPENDENT_AMBULATORY_CARE_PROVIDER_SITE_OTHER): Payer: Medicare Other | Admitting: Dentistry

## 2021-06-01 ENCOUNTER — Other Ambulatory Visit: Payer: Self-pay

## 2021-06-01 DIAGNOSIS — K08199 Complete loss of teeth due to other specified cause, unspecified class: Secondary | ICD-10-CM

## 2021-06-01 NOTE — Progress Notes (Signed)
Department of Dental Medicine     POSTOPERATIVE VISIT  Service Date:   06/01/2021  Patient Name:   Patrick Beck Date of Birth:   1946-02-14 Medical Record Number: [id number]  TODAY'S VISIT    Assessment:   . The patient continues to heal well and consistent with dental procedures performed.  Plan:  . Follow-up as needed.   The patient is cleared for cardiac surgery from a dental standpoint at this time. Recommendations: . Establish care at an outside dental office for routine treatment including replacement of missing teeth as needed, cleanings/periodontal therapy and exams.  . Discussed in detail all treatment options and recommendations with the patient and they are agreeable to the plan.  PROGRESS NOTE:   COVID-19 SCREENING:  The patient denies symptoms concerning for COVID-19 infection including fever, chills, cough, or newly developed shortness of breath.   HISTORY OF PRESENT ILLNESS . Patrick Beck presents today for a postoperative visit s/p full mouth extractions in the operating room on 5/26.   . Medical and dental history reviewed with the patient.  No changes reported.   CHIEF COMPLAINT:   . Patient with no complaints.   Patient Active Problem List   Diagnosis Date Noted  . Caries   . Retained dental root   . Periodontal disease   . Nonrheumatic aortic valve stenosis   . Cavitary pneumonia 10/22/2017  . COPD (chronic obstructive pulmonary disease) ([place]) 10/22/2017  . Tobacco dependence 10/22/2017  . Type 2 diabetes mellitus without complication ([place]) 37/62/8315  . Essential hypertension 10/22/2017  . Glaucoma 10/22/2017   Past Medical History:  Diagnosis Date  . Aortic atherosclerosis ([place])   . Aortic stenosis   . Arthritis   . Carotid artery disease ([place])   . COPD (chronic obstructive pulmonary disease) ([place])   . Diverticulosis   . Dyspnea    with much activity  . Erectile dysfunction   . Essential hypertension   . GERD (gastroesophageal reflux  disease)   . Glaucoma   . H/O hiatal hernia   . Heart murmur   . History of hyperlipidemia   . PAD (peripheral artery disease) ([place])   . Pneumonia ish  . Type 2 diabetes mellitus ([place])    Past Surgical History:  Procedure Laterality Date  . CARDIAC CATHETERIZATION    . COLONOSCOPY N/A 04/06/2013   Procedure: COLONOSCOPY;  Surgeon: Jamesetta So, MD;  Location[facility];  Service: Gastroenterology;  Laterality: N/A;  . HERNIA REPAIR     Three prior surgeries before 1966  . MULTIPLE EXTRACTIONS WITH ALVEOLOPLASTY N/A 05/24/2021   Procedure: EXTRACTION OF TEETH NUMBER TWO, THREE, FIVE, SIX, SEVEN, EIGHT, NINE, TEN, ELEVEN, TWELVE, SEVENTEEN, EIGHTTEEN, NINETEEN, TWENTY, TWENTY-ONE, TWENTY- TWO, TWENTY-FOUR, THWENTY-SEVEN, TWENTY-EIGHT, TWENTY-NINE WITH ALVEOLOPLASTY IN ALL FOUR QUADRANTS;  Surgeon: [name], DMD;  Location: [facility];  Service: Dentistry;  Laterality: N/A;  . RIGHT/LEFT HEART CATH AND CORONARY ANGIOGRAPHY N/A 04/18/2021   Procedure: RIGHT/LEFT HEART CATH AND CORONARY ANGIOGRAPHY;  Surgeon: Burnell Blanks, MD;  Location: [facility];  Service: Cardiovascular;  Laterality: N/A;   Current Outpatient Medications  Medication Sig Dispense Refill  . aspirin 325 MG EC tablet Take 325 mg by mouth daily.    . brimonidine (ALPHAGAN) 0.2 % ophthalmic solution Place 1 drop into both eyes 3 (three) times daily.    . diphenhydrAMINE (BENADRYL) 25 MG tablet Take 25 mg by mouth daily as needed for allergies.    Marland Kitchen esomeprazole (NEXIUM) 20 MG  capsule Take 20 mg by mouth daily.    . furosemide (LASIX) 40 MG tablet Take 20 mg by mouth daily.    . hydrochlorothiazide (HYDRODIURIL) 12.5 MG tablet Take 12.5 mg by mouth daily.    . hydrocortisone cream 1 % Apply 1 application topically 2 (two) times daily as needed (bug bites).    Marland Kitchen ibuprofen (ADVIL,MOTRIN) 200 MG tablet Take 800 mg by mouth every 6 (six) hours as needed for mild pain.    Marland Kitchen latanoprost (XALATAN) 0.005 %  ophthalmic solution Place 1 drop into both eyes at bedtime.    . Melatonin 10 MG CAPS Take 30-40 mg by mouth at bedtime.    . metFORMIN (GLUCOPHAGE) 1000 MG tablet Take 1,000 mg by mouth 2 (two) times daily with a meal.    . neomycin-bacitracin-polymyxin (NEOSPORIN) ointment Apply 1 application topically as needed for wound care.    . pravastatin (PRAVACHOL) 40 MG tablet Take 40 mg by mouth every evening.    . timolol (BETIMOL) 0.5 % ophthalmic solution Place 1 drop into both eyes daily.     No current facility-administered medications for this visit.   Allergies  Allergen Reactions  . Macadamia Nut Oil Anaphylaxis    Throat swelling and can't breathe    LABS: Lab Results  Component Value Date   WBC 9.6 05/24/2021   HGB 14.1 05/24/2021   HCT 45.7 05/24/2021   MCV 104.3 (H) 05/24/2021   PLT 209 05/24/2021   BMET    Component Value Date/Time   NA 139 04/18/2021 1142   NA 138 04/04/2021 1233   K 4.5 04/18/2021 1142   CL 101 04/04/2021 1233   CO2 24 04/04/2021 1233   GLUCOSE 169 (H) 04/04/2021 1233   GLUCOSE 154 (H) 10/24/2017 0450   BUN 11 04/04/2021 1233   CREATININE 0.78 04/04/2021 1233   CALCIUM 8.6 04/04/2021 1233   GFRNONAA >60 10/24/2017 0450   GFRAA >60 10/24/2017 0450    No results found for: INR, PROTIME No results found for: PTT   VITALS: BP 140/86 (BP Location: Right Arm)   Pulse 66   Temp 98.4 F (36.9 C) (Oral)    EXAM: . Extraction sites appear to be healing WNL.  No signs of wound dehiscence or infection evident upon examination. . Sutures remain in-tact in small area in lower left.   ASSESSMENT:   . Postoperative course is consistent with dental procedures performed.   PLAN AND RECOMMENDATIONS: . Follow-up as needed.  . Establish care at an outside dental office for routine dental care including replacement of missing teeth as needed, cleanings/periodontal therapy and exams.   . Recommend that the patient discuss plans to return to the  dentist for non-urgent treatment with their medical team to ensure they are medically optimized and there are no contraindications. . Call if any questions or concerns arise.  o All questions and concerns were invited and addressed.  The patient tolerated today's visit well and departed in stable condition.  [place] Benson Norway, D.M.D.

## 2021-06-05 ENCOUNTER — Other Ambulatory Visit: Payer: Self-pay

## 2021-06-05 DIAGNOSIS — I350 Nonrheumatic aortic (valve) stenosis: Secondary | ICD-10-CM

## 2021-06-14 NOTE — Pre-Procedure Instructions (Signed)
Surgical Instructions    Your procedure is scheduled on Tuesday June 21st.  Report to [facility] Main Entrance "A" at 05:30 A.M., then check in with the Admitting office.  Call this number if you have problems the morning of surgery:  [phone]   If you have any questions prior to your surgery date call [phone]: Open Monday-Friday 8am-4pm    Remember:  Do not eat or drink after midnight the night before your surgery    Per your surgeon's instructions regarding medications:   Home medications to HOLD: Stop taking Metformin on 6/19. You will take your last dose on 6/18 (Saturday). Continue taking all other medications without change through the day before surgery. On the morning of surgery do not take any medications.                       Do NOT Smoke (Tobacco/Vaping) or drink Alcohol 24 hours prior to your procedure.  If you use a CPAP at night, you may bring all equipment for your overnight stay.   Contacts, glasses, piercing's, hearing aid's, dentures or partials may not be worn into surgery, please bring cases for these belongings.    For patients admitted to the hospital, discharge time will be determined by your treatment team.   Patients discharged the day of surgery will not be allowed to drive home, and someone needs to stay with them for 24 hours.    Special instructions:   - Preparing For Surgery  Before surgery, you can play an important role. Because skin is not sterile, your skin needs to be as free of germs as possible. You can reduce the number of germs on your skin by washing with CHG (chlorahexidine gluconate) Soap before surgery.  CHG is an antiseptic cleaner which kills germs and bonds with the skin to continue killing germs even after washing.    Oral Hygiene is also important to reduce your risk of infection.  Remember - BRUSH YOUR TEETH THE MORNING OF SURGERY WITH YOUR REGULAR TOOTHPASTE  Please do not use if you have an allergy to CHG  or antibacterial soaps. If your skin becomes reddened/irritated stop using the CHG.  Do not shave (including legs and underarms) for at least 48 hours prior to first CHG shower. It is OK to shave your face.  Please follow these instructions carefully.   Shower the NIGHT BEFORE SURGERY and the MORNING OF SURGERY  If you chose to wash your hair, wash your hair first as usual with your normal shampoo.  After you shampoo, rinse your hair and body thoroughly to remove the shampoo.  Use CHG Soap as you would any other liquid soap. You can apply CHG directly to the skin and wash gently with a scrungie or a clean washcloth.   Apply the CHG Soap to your body ONLY FROM THE NECK DOWN.  Do not use on open wounds or open sores. Avoid contact with your eyes, ears, mouth and genitals (private parts). Wash Face and genitals (private parts)  with your normal soap.   Wash thoroughly, paying special attention to the area where your surgery will be performed.  Thoroughly rinse your body with warm water from the neck down.  DO NOT shower/wash with your normal soap after using and rinsing off the CHG Soap.  Pat yourself dry with a CLEAN TOWEL.  Wear CLEAN PAJAMAS to bed the night before surgery  Place CLEAN SHEETS on your bed the night before your  surgery  DO NOT SLEEP WITH PETS.   Day of Surgery: Shower with CHG soap. Do not wear jewelry, make up, or nail polish Do not wear lotions, powders, colognes, or deodorant. Do not shave 48 hours prior to surgery.  Men may shave face and neck. Do not bring valuables to the hospital. [facility] is not responsible for any belongings or valuables. Wear Clean/Comfortable clothing the morning of surgery Remember to brush your teeth WITH YOUR REGULAR TOOTHPASTE.   Please read over the following fact sheets that you were given.

## 2021-06-15 ENCOUNTER — Ambulatory Visit (HOSPITAL_COMMUNITY)
Admission: RE | Admit: 2021-06-15 | Discharge: 2021-06-15 | Disposition: A | Discharge status: Home / Self Care | Payer: Medicare Other | Source: Ambulatory Visit | Attending: Cardiovascular Disease | Admitting: Cardiovascular Disease

## 2021-06-15 ENCOUNTER — Encounter (HOSPITAL_COMMUNITY): Payer: Self-pay

## 2021-06-15 ENCOUNTER — Encounter (HOSPITAL_COMMUNITY)
Admission: RE | Admit: 2021-06-15 | Discharge: 2021-06-15 | Disposition: A | Discharge status: Home / Self Care | Payer: Medicare Other | Source: Ambulatory Visit | Attending: Cardiovascular Disease | Admitting: Cardiovascular Disease

## 2021-06-15 ENCOUNTER — Other Ambulatory Visit: Payer: Self-pay

## 2021-06-15 ENCOUNTER — Ambulatory Visit: Payer: Medicare Other | Attending: Cardiovascular Disease | Admitting: Physical Therapy

## 2021-06-15 ENCOUNTER — Encounter: Payer: Self-pay | Admitting: Physical Therapy

## 2021-06-15 DIAGNOSIS — Z01818 Encounter for other preprocedural examination: Secondary | ICD-10-CM | POA: Insufficient documentation

## 2021-06-15 DIAGNOSIS — I350 Nonrheumatic aortic (valve) stenosis: Secondary | ICD-10-CM | POA: Insufficient documentation

## 2021-06-15 DIAGNOSIS — J449 Chronic obstructive pulmonary disease, unspecified: Secondary | ICD-10-CM | POA: Diagnosis not present

## 2021-06-15 DIAGNOSIS — Z20822 Contact with and (suspected) exposure to covid-19: Secondary | ICD-10-CM | POA: Diagnosis not present

## 2021-06-15 DIAGNOSIS — R2689 Other abnormalities of gait and mobility: Secondary | ICD-10-CM | POA: Diagnosis present

## 2021-06-15 HISTORY — DX: Polyneuropathy, unspecified: G62.9

## 2021-06-15 LAB — COMPREHENSIVE METABOLIC PANEL
ALT: 26 U/L (ref 0–44)
AST: 35 U/L (ref 15–41)
Albumin: 3.4 g/dL — ABNORMAL LOW (ref 3.5–5.0)
Alkaline Phosphatase: 74 U/L (ref 38–126)
Anion gap: 8 (ref 5–15)
BUN: 10 mg/dL (ref 8–23)
CO2: 24 mmol/L (ref 22–32)
Calcium: 8.7 mg/dL — ABNORMAL LOW (ref 8.9–10.3)
Chloride: 105 mmol/L (ref 98–111)
Creatinine, Ser: 0.73 mg/dL (ref 0.61–1.24)
GFR, Estimated: >60 mL/min (ref >60)
Glucose, Bld: 149 mg/dL — ABNORMAL HIGH (ref 70–99)
Potassium: 5 mmol/L (ref 3.5–5.1)
Sodium: 137 mmol/L (ref 135–145)
Total Bilirubin: 0.7 mg/dL (ref 0.3–1.2)
Total Protein: 5.9 g/dL — ABNORMAL LOW (ref 6.5–8.1)

## 2021-06-15 LAB — URINALYSIS, ROUTINE W REFLEX MICROSCOPIC
Bilirubin Urine: NEGATIVE
Glucose, UA: NEGATIVE mg/dL
Hgb urine dipstick: NEGATIVE
Ketones, ur: 5 mg/dL — AB
Leukocytes,Ua: NEGATIVE
Nitrite: NEGATIVE
Protein, ur: NEGATIVE mg/dL
Specific Gravity, Urine: 1.017 (ref 1.005–1.030)
pH: 5 (ref 5.0–8.0)

## 2021-06-15 LAB — SURGICAL PCR SCREEN
MRSA, PCR: NEGATIVE
Staphylococcus aureus: NEGATIVE

## 2021-06-15 LAB — BLOOD GAS, ARTERIAL
Acid-base deficit: 0.1 mmol/L (ref 0.0–2.0)
Bicarbonate: 24.8 mmol/L (ref 20.0–28.0)
FIO2: 21
O2 Saturation: 97.4 %
Patient temperature: 37
pCO2 arterial: 46.2 mmHg (ref 32.0–48.0)
pH, Arterial: 7.349 — ABNORMAL LOW (ref 7.350–7.450)
pO2, Arterial: 96.1 mmHg (ref 83.0–108.0)

## 2021-06-15 LAB — TYPE AND SCREEN
ABO/RH(D): O NEG
Antibody Screen: NEGATIVE

## 2021-06-15 LAB — CBC
HCT: 43.1 % (ref 39.0–52.0)
Hemoglobin: 13.9 g/dL (ref 13.0–17.0)
MCH: 32 pg (ref 26.0–34.0)
MCHC: 32.3 g/dL (ref 30.0–36.0)
MCV: 99.3 fL (ref 80.0–100.0)
Platelets: 251 10*3/uL (ref 150–400)
RBC: 4.34 MIL/uL (ref 4.22–5.81)
RDW: 13 % (ref 11.5–15.5)
WBC: 8.2 10*3/uL (ref 4.0–10.5)
nRBC: 0 % (ref 0.0–0.2)

## 2021-06-15 LAB — PROTIME-INR
INR: 0.9 (ref 0.8–1.2)
Prothrombin Time: 12.5 seconds (ref 11.4–15.2)

## 2021-06-15 LAB — SARS CORONAVIRUS 2 (TAT 6-24 HRS): SARS Coronavirus 2: NEGATIVE

## 2021-06-15 LAB — GLUCOSE, CAPILLARY: Glucose-Capillary: 150 mg/dL — ABNORMAL HIGH (ref 70–99)

## 2021-06-15 NOTE — Therapy (Signed)
[name], [place], [address] Phone: [phone]   Fax:  [phone]  Physical Therapy Pre-TAVR Evaluation  Patient Details  Name: Patrick Beck MRN: [id number] Date of Birth: 13-Mar-1946 Referring Provider (PT): Burnell Blanks, MD   Encounter Date: 06/15/2021   PT End of Session - 06/15/21 0906     Visit Number 1    Number of Visits 1    Date for PT Re-Evaluation 06/15/21    Authorization Type UHC MCR    Progress Note Due on Visit 10    PT Start Time 0830    PT Stop Time 0900    PT Time Calculation (min) 30 min    Equipment Utilized During Treatment Gait belt    Activity Tolerance Patient tolerated treatment well;Patient limited by fatigue    Behavior During Therapy WFL for tasks assessed/performed             Past Medical History:  Diagnosis Date   Aortic atherosclerosis ([place])    Aortic stenosis    Arthritis    Carotid artery disease ([place])    COPD (chronic obstructive pulmonary disease) ([place])    Diverticulosis    Dyspnea    with much activity   Erectile dysfunction    Essential hypertension    GERD (gastroesophageal reflux disease)    Glaucoma    H/O hiatal hernia    Heart murmur    History of hyperlipidemia    PAD (peripheral artery disease) ([place])    Pneumonia ish   Type 2 diabetes mellitus ([place])     Past Surgical History:  Procedure Laterality Date   CARDIAC CATHETERIZATION     COLONOSCOPY N/A 04/06/2013   Procedure: COLONOSCOPY;  Surgeon: Jamesetta So, MD;  [facility];  Service: Gastroenterology;  Laterality: N/A;   HERNIA REPAIR     Three prior surgeries before 1966   MULTIPLE EXTRACTIONS WITH ALVEOLOPLASTY N/A 05/24/2021   Procedure: EXTRACTION OF TEETH NUMBER TWO, THREE, FIVE, SIX, SEVEN, EIGHT, NINE, TEN, ELEVEN, TWELVE, SEVENTEEN, EIGHTTEEN, NINETEEN, TWENTY, TWENTY-ONE, TWENTY- TWO, TWENTY-FOUR, THWENTY-SEVEN, TWENTY-EIGHT, TWENTY-NINE WITH ALVEOLOPLASTY  IN ALL FOUR QUADRANTS;  Surgeon: [name], DMD;  Location: [facility];  Service: Dentistry;  Laterality: N/A;   RIGHT/LEFT HEART CATH AND CORONARY ANGIOGRAPHY N/A 04/18/2021   Procedure: RIGHT/LEFT HEART CATH AND CORONARY ANGIOGRAPHY;  Surgeon: Burnell Blanks, MD;  Location: [facility];  Service: Cardiovascular;  Laterality: N/A;    There were no vitals filed for this visit.    Subjective Assessment - 06/15/21 0836     Subjective Patient reports a lot of fatigue and shortness of breath that he believes was from the COPD but has been told likely from heart. He had pneumonia just under 3 years ago which took a lot longer to recover from and he has lost a lot of muscle since then. Has trouble with household tasks and walking due to symptoms.    Pertinent History History of aortic stenosis, longstanding tobacco abuse with COPD, hypertension, peripheral arterial disease, type 2 diabetes mellitus, and GE reflux disease    Limitations Walking;House hold activities    How long can you walk comfortably? "Can walk around [employer] for quite a while using cart"    Patient Stated Goals Get heart better    Currently in Pain? No/denies                [facility] PT Assessment - 06/15/21 0001  Assessment   Medical Diagnosis Severe Aortic Stenosis    Referring Provider (PT) Burnell Blanks, MD    Onset Date/Surgical Date --   worsening over past year   Hand Dominance Right    Next MD Visit 06/19/2021    Prior Therapy No      Precautions   Precautions None      Restrictions   Weight Bearing Restrictions No      Balance Screen   Has the patient fallen in the past 6 months No    Has the patient had a decrease in activity level because of a fear of falling?  No    Is the patient reluctant to leave their home because of a fear of falling?  No      Home Environment   Living Environment Private residence    Living Arrangements Spouse/significant other    Type of [place] to enter    Entrance Stairs-Number of Steps 1    Home Layout One level      Prior Function   Level of Independence Independent    Vocation Retired    Facilities manager, wood work      Cognition   Overall Cognitive Status Within Advertising copywriter for tasks assessed      Observation/Other Assessments   Observations Patient appears in no apparent distress    Focus on Therapeutic Outcomes (FOTO)  NA      Sensation   Light Touch Appears Intact      Coordination   Gross Motor Movements are Fluid and Coordinated Yes      ROM / Strength   AROM / PROM / Strength AROM;Strength      AROM   Overall AROM Comments AROM grossly WFL for BUE and BLE      Strength   Overall Strength Comments Strength grossly 4/5 MMT for BUE and BLE    Strength Assessment Site Hand    Right/Left hand Right;Left    Right Hand Grip (lbs) 65    Left Hand Grip (lbs) 60      Ambulation/Gait   Ambulation/Gait Yes    Ambulation/Gait Assistance 7: Independent    Gait Comments Patient will occasional increased sway/unsteadiness and lateral veering   patient reports due to vision             [facility] Pre-Surgical Assessment - 06/15/21 0001     5 Meter Walk Test- trial 1 5 sec    5 Meter Walk Test- trial 2 5 sec.     5 Meter Walk Test- trial 3 5 sec.    5 meter walk test average 5 sec    4 Stage Balance Test tolerated for:  2 sec.    4 Stage Balance Test Position 3    Sit To Stand Test- trial 1 0 sec.    Comment unable to perform without using hands    ADL/IADL Independent with: Bathing;Dressing;Meal prep;Finances;Yard work    ADL/IADL Therapist, sports Index Vulnerable    6 Minute Walk- Baseline yes    BP (mmHg) 176/95    HR (bpm) 75    02 Sat (%RA) 97 %    Modified Borg Scale for Dyspnea 0- Nothing at all    Perceived Rate of Exertion (Borg) 6-    6 Minute Walk Post Test yes    BP (mmHg) 185/98    HR (bpm) 86    02 Sat (%RA) 98 %    Modified Borg  Scale for Dyspnea 4-  somewhat severe    Perceived Rate of Exertion (Borg) 12-    Aerobic Endurance Distance Walked 740    Endurance additional comments walked for 3:20 then required rest break the rest of the time                      Objective measurements completed on examination: See above findings.               PT Education - 06/15/21 0905     Education Details Exam findings    Person(s) Educated Patient    Methods Explanation    Comprehension Verbalized understanding                         Plan - 06/15/21 0906     Clinical Impression Statement See below    Personal Factors and Comorbidities Fitness;Time since onset of injury/illness/exacerbation    Examination-Activity Limitations Locomotion Level    Examination-Participation Restrictions Community Activity;Shop;Yard Work    Stability/Clinical Decision Making Stable/Uncomplicated    Clinical Decision Making Low    Rehab Potential Good    PT Frequency One time visit    PT Treatment/Interventions ADLs/Self Care Home Management;Gait training;Therapeutic activities;Therapeutic exercise;Balance training;Neuromuscular re-education;Patient/family education;Energy conservation    PT Next Visit Plan NA    PT Home Exercise Plan NA    Consulted and Agree with Plan of Care Patient             Clinical Impression Statement: Pt is a 75 yo male presenting to OP PT for evaluation prior to possible TAVR surgery due to severe aortic stenosis. Pt reports onset of fatigue and shortness of breath approximately 12 months ago. Symptoms are limiting walking and household tasks. Pt presents with good ROM and strength, moderate balance and is not at high fall risk 4 stage balance test, good walking speed and poor aerobic endurance per 6 minute walk test. Pt ambulated 740 feet in 3:20 before requesting a seated rest beak lasting 2:40. At time of rest, patient's HR was 85 bpm and O2 was 97 on room air. Pt reported 4/10 shortness  of breath on modified scale for dyspnea. Pt unable to resume after rest. Pt ambulated a total of 740 feet in 6 minute walk. Shortness of breath increased significantly with 6 minute walk test. Based on the Short Physical Performance Battery, patient has a frailty rating of 6/12 with </= 5/12 considered frail.   Patient demonstrates the following deficits and impairments:  Abnormal gait, Difficulty walking, Cardiopulmonary status limiting activity, Decreased strength, Decreased balance, Decreased activity tolerance  Visit Diagnosis: Other abnormalities of gait and mobility     Problem List Patient Active Problem List   Diagnosis Date Noted   [place]    Retained dental root    Periodontal disease    Nonrheumatic aortic valve stenosis    Cavitary pneumonia 10/22/2017   COPD (chronic obstructive pulmonary disease) ([place]) 10/22/2017   Tobacco dependence 10/22/2017   Type 2 diabetes mellitus without complication ([place]) 73/22/0254   Essential hypertension 10/22/2017   Glaucoma 10/22/2017    Hilda Blades, PT, DPT, LAT, ATC 06/15/21  9:11 AM Phone: [phone] Fax: [facility] [address] [place], [place], [address] Phone: [phone]   Fax:  [phone]  Name: Patrick Beck MRN: [id number] Date of Birth: 1946/05/05

## 2021-06-15 NOTE — Progress Notes (Signed)
PCP - Lemmie Evens, MD Cardiologist - Satira Sark, MD  PPM/ICD - Denies  Chest x-ray - 06/15/21 EKG - 06/15/21 Stress Test - Denies ECHO - 03/15/21 Cardiac Cath - 04/18/21  Sleep Study - Denies   Fasting Blood Sugar 120-125 Checks Blood Sugar x1 daily. CBG at PAT appointment was 150. A1C obtained.  Blood Thinner Instructions: N/A Aspirin Instructions: Per pt, continue  ERAS Protcol - N/A PRE-SURGERY Ensure or G2- N/A  COVID TEST- 06/15/21   Anesthesia review: Yes, cardiac hx.  Patient denies shortness of breath, fever, cough and chest pain at PAT appointment   All instructions explained to the patient, with a verbal understanding of the material. Patient agrees to go over the instructions while at home for a better understanding. Patient also instructed to self quarantine after being tested for COVID-19. The opportunity to ask questions was provided.

## 2021-06-16 LAB — HEMOGLOBIN A1C
Hgb A1c MFr Bld: 8.5 % — ABNORMAL HIGH (ref 4.8–5.6)
Mean Plasma Glucose: 197 mg/dL

## 2021-06-19 ENCOUNTER — Other Ambulatory Visit: Payer: Self-pay

## 2021-06-19 DIAGNOSIS — I350 Nonrheumatic aortic (valve) stenosis: Secondary | ICD-10-CM

## 2021-06-21 NOTE — Pre-Procedure Instructions (Signed)
Surgical Instructions    Your procedure is scheduled on 06/26/21  Report to [facility] Main Entrance "A" at 05:30 A.M., then check in with the Admitting office.  Call this number if you have problems the morning of surgery:  [phone]   If you have any questions prior to your surgery date call [phone]: Open Monday-Friday 8am-4pm    Remember:  Do not eat or drink after midnight the night before your surgery    Per your surgeon's instructions regarding medications:    Stop taking Metformin on 6/26. You will take your last dose on 6/25 (Saturday).  Continue taking all other medications without change through the day before surgery. On the morning of surgery do not take any medications.     HOW TO MANAGE YOUR DIABETES BEFORE AND AFTER SURGERY  Why is it important to control my blood sugar before and after surgery? Improving blood sugar levels before and after surgery helps healing and can limit problems. A way of improving blood sugar control is eating a healthy diet by:  Eating less sugar and carbohydrates  Increasing activity/exercise  Talking with your doctor about reaching your blood sugar goals High blood sugars (greater than 180 mg/dL) can raise your risk of infections and slow your recovery, so you will need to focus on controlling your diabetes during the weeks before surgery. Make sure that the doctor who takes care of your diabetes knows about your planned surgery including the date and location.  How do I manage my blood sugar before surgery? Check your blood sugar at least 4 times a day, starting 2 days before surgery, to make sure that the level is not too high or low.  Check your blood sugar the morning of your surgery when you wake up and every 2 hours until you get to the Short Stay unit.  If your blood sugar is less than 70 mg/dL, you will need to treat for low blood sugar: Do not take insulin. Treat a low blood sugar (less than 70 mg/dL) with  cup of  clear juice (cranberry or apple), 4 glucose tablets, OR glucose gel. Recheck blood sugar in 15 minutes after treatment (to make sure it is greater than 70 mg/dL). If your blood sugar is not greater than 70 mg/dL on recheck, call [phone] for further instructions. Report your blood sugar to the short stay nurse when you get to Short Stay.  If you are admitted to the hospital after surgery: Your blood sugar will be checked by the staff and you will probably be given insulin after surgery (instead of oral diabetes medicines) to make sure you have good blood sugar levels. The goal for blood sugar control after surgery is 80-180 mg/dL.                      Do NOT Smoke (Tobacco/Vaping) or drink Alcohol 24 hours prior to your procedure.  If you use a CPAP at night, you may bring all equipment for your overnight stay.   Contacts, glasses, piercing's, hearing aid's, dentures or partials may not be worn into surgery, please bring cases for these belongings.    For patients admitted to the hospital, discharge time will be determined by your treatment team.   Patients discharged the day of surgery will not be allowed to drive home, and someone needs to stay with them for 24 hours.    Special instructions:   [place]- Preparing For Surgery  Before surgery, you can play  an important role. Because skin is not sterile, your skin needs to be as free of germs as possible. You can reduce the number of germs on your skin by washing with CHG (chlorahexidine gluconate) Soap before surgery.  CHG is an antiseptic cleaner which kills germs and bonds with the skin to continue killing germs even after washing.    Oral Hygiene is also important to reduce your risk of infection.  Remember - BRUSH YOUR TEETH THE MORNING OF SURGERY WITH YOUR REGULAR TOOTHPASTE  Please do not use if you have an allergy to CHG or antibacterial soaps. If your skin becomes reddened/irritated stop using the CHG.  Do not shave  (including legs and underarms) for at least 48 hours prior to first CHG shower. It is OK to shave your face.  Please follow these instructions carefully.   Shower the NIGHT BEFORE SURGERY and the MORNING OF SURGERY  If you chose to wash your hair, wash your hair first as usual with your normal shampoo.  After you shampoo, rinse your hair and body thoroughly to remove the shampoo.  Use CHG Soap as you would any other liquid soap. You can apply CHG directly to the skin and wash gently with a scrungie or a clean washcloth.   Apply the CHG Soap to your body ONLY FROM THE NECK DOWN.  Do not use on open wounds or open sores. Avoid contact with your eyes, ears, mouth and genitals (private parts). Wash Face and genitals (private parts)  with your normal soap.   Wash thoroughly, paying special attention to the area where your surgery will be performed.  Thoroughly rinse your body with warm water from the neck down.  DO NOT shower/wash with your normal soap after using and rinsing off the CHG Soap.  Pat yourself dry with a CLEAN TOWEL.  Wear CLEAN PAJAMAS to bed the night before surgery  Place CLEAN SHEETS on your bed the night before your surgery  DO NOT SLEEP WITH PETS.   Day of Surgery: Shower with CHG soap. Do not wear jewelry, make up, or nail polish Do not wear lotions, powders, colognes, or deodorant. Do not shave 48 hours prior to surgery.  Men may shave face and neck. Do not bring valuables to the hospital. [facility] is not responsible for any belongings or valuables. Wear Clean/Comfortable clothing the morning of surgery Remember to brush your teeth WITH YOUR REGULAR TOOTHPASTE.   Please read over the following fact sheets that you were given.

## 2021-06-22 ENCOUNTER — Encounter (HOSPITAL_COMMUNITY)
Admission: RE | Admit: 2021-06-22 | Discharge: 2021-06-22 | Disposition: A | Discharge status: Home / Self Care | Payer: Medicare Other | Source: Ambulatory Visit | Attending: Cardiovascular Disease | Admitting: Cardiovascular Disease

## 2021-06-22 ENCOUNTER — Encounter (HOSPITAL_COMMUNITY): Payer: Self-pay

## 2021-06-22 ENCOUNTER — Other Ambulatory Visit: Payer: Self-pay

## 2021-06-22 DIAGNOSIS — Z01812 Encounter for preprocedural laboratory examination: Secondary | ICD-10-CM | POA: Diagnosis not present

## 2021-06-22 DIAGNOSIS — Z20822 Contact with and (suspected) exposure to covid-19: Secondary | ICD-10-CM | POA: Insufficient documentation

## 2021-06-22 DIAGNOSIS — I350 Nonrheumatic aortic (valve) stenosis: Secondary | ICD-10-CM

## 2021-06-22 LAB — CBC
HCT: 46.8 % (ref 39.0–52.0)
Hemoglobin: 14.6 g/dL (ref 13.0–17.0)
MCH: 31.6 pg (ref 26.0–34.0)
MCHC: 31.2 g/dL (ref 30.0–36.0)
MCV: 101.3 fL — ABNORMAL HIGH (ref 80.0–100.0)
Platelets: 222 10*3/uL (ref 150–400)
RBC: 4.62 MIL/uL (ref 4.22–5.81)
RDW: 13.2 % (ref 11.5–15.5)
WBC: 7.5 10*3/uL (ref 4.0–10.5)
nRBC: 0 % (ref 0.0–0.2)

## 2021-06-22 LAB — COMPREHENSIVE METABOLIC PANEL
ALT: 31 U/L (ref 0–44)
AST: 28 U/L (ref 15–41)
Albumin: 3.6 g/dL (ref 3.5–5.0)
Alkaline Phosphatase: 87 U/L (ref 38–126)
Anion gap: 6 (ref 5–15)
BUN: 11 mg/dL (ref 8–23)
CO2: 29 mmol/L (ref 22–32)
Calcium: 8.6 mg/dL — ABNORMAL LOW (ref 8.9–10.3)
Chloride: 100 mmol/L (ref 98–111)
Creatinine, Ser: 0.85 mg/dL (ref 0.61–1.24)
GFR, Estimated: >60 mL/min (ref >60)
Glucose, Bld: 162 mg/dL — ABNORMAL HIGH (ref 70–99)
Potassium: 4.4 mmol/L (ref 3.5–5.1)
Sodium: 135 mmol/L (ref 135–145)
Total Bilirubin: 0.4 mg/dL (ref 0.3–1.2)
Total Protein: 6.2 g/dL — ABNORMAL LOW (ref 6.5–8.1)

## 2021-06-22 LAB — URINALYSIS, ROUTINE W REFLEX MICROSCOPIC
Bilirubin Urine: NEGATIVE
Glucose, UA: 50 mg/dL — AB
Hgb urine dipstick: NEGATIVE
Ketones, ur: NEGATIVE mg/dL
Leukocytes,Ua: NEGATIVE
Nitrite: NEGATIVE
Protein, ur: NEGATIVE mg/dL
Specific Gravity, Urine: 1.016 (ref 1.005–1.030)
pH: 5 (ref 5.0–8.0)

## 2021-06-22 LAB — PROTIME-INR
INR: 0.9 (ref 0.8–1.2)
Prothrombin Time: 11.8 seconds (ref 11.4–15.2)

## 2021-06-22 LAB — ABO/RH: ABO/RH(D): O NEG

## 2021-06-22 LAB — GLUCOSE, CAPILLARY: Glucose-Capillary: 179 mg/dL — ABNORMAL HIGH (ref 70–99)

## 2021-06-22 LAB — SARS CORONAVIRUS 2 (TAT 6-24 HRS): SARS Coronavirus 2: NEGATIVE

## 2021-06-22 NOTE — Progress Notes (Signed)
PCP - Dr. Karie Kirks  Cardiologist - Dr. Domenic Polite  Chest x-ray - 06/15/21 (E)  EKG - 06/15/21 (E)  Stress Test - Denies  ECHO - 03/15/21 (E)  Cardiac Cath - 04/18/21 (E)  AICD-na PM-na LOOP-na  Sleep Study - Denies CPAP - Denies  LABS- 06/15/21: ABG, T/S, PCR 06/22/21: CBC, CMP, PT, PTT, UA, T/S, COVID  ASA- LD- 06/25/21  ERAS- No  HA1C- 06/15/21: 8.5 Fasting Blood Sugar - 110-230, today 179 Checks Blood Sugar _2-3____ times a day  Anesthesia- Yes- previous consult  Pt denies having chest pain, sob, or fever at this time. All instructions explained to the pt, with a verbal understanding of the material. Pt agrees to go over the instructions while at home for a better understanding. Pt also instructed to wear a mask and social distance if he has to go out after being tested for COVID-19. The opportunity to ask questions was provided.    Coronavirus Screening  Have you experienced the following symptoms:  Cough yes/no: No Fever (>100.64F)  yes/no: No Runny nose yes/no: No Sore throat yes/no: No Difficulty breathing/shortness of breath  yes/no: No  Have you or a family member traveled in the last 14 days and where? yes/no: No   If the patient indicates "YES" to the above questions, their PAT will be rescheduled to limit the exposure to others and, the surgeon will be notified. THE PATIENT WILL NEED TO BE ASYMPTOMATIC FOR 14 DAYS.   If the patient is not experiencing any of these symptoms, the PAT nurse will instruct them to NOT bring anyone with them to their appointment since they may have these symptoms or traveled as well.   Please remind your patients and families that hospital visitation restrictions are in effect and the importance of the restrictions.

## 2021-06-22 NOTE — Pre-Procedure Instructions (Signed)
Patrick Beck  06/22/2021     Your procedure is scheduled on Tues., June 26, 2021 from 7:30AM-9:40AM  Report to [facility] Entrance "A" at 5:30AM  Call this number if you have problems the morning of surgery:  [phone]   Remember:  Do not eat or drink after midnight.    Take these medicines the morning of surgery with A SIP OF WATER: None Take last dose of Metformin on 06/23/21 Take last dose of Aspirin on 06/25/21  As of today, STOP taking all Aspirin (unless instructed by your doctor) and Other Aspirin containing products, Vitamins, Fish oils, and Herbal medications. Also stop all NSAIDS i.e. Advil, Ibuprofen, Motrin, Aleve, Anaprox, Naproxen, BC, Goody Powders, and all Supplements.    WHAT DO I DO ABOUT MY DIABETES MEDICATION?  Do not take MetFORMIN (GLUCOPHAGE)  the morning of surgery.  How to Manage Your Diabetes Before and After Surgery  How do I manage my blood sugar before surgery?  Check your blood sugar the morning of your surgery when you wake up and every 2 hours until you get to the Short Stay unit. If your blood sugar is less than 70 mg/dL, you will need to treat for low blood sugar: Do not take insulin. Treat a low blood sugar (less than 70 mg/dL) with  cup of clear juice (cranberry or apple), 4 glucose tablets, OR glucose gel. Recheck blood sugar in 15 minutes after treatment (to make sure it is greater than 70 mg/dL). If your blood sugar is not greater than 70 mg/dL on recheck, call [phone]  for further instructions.  If your CBG is greater than 220 mg/dL, you may take  of your sliding scale (correction) dose of insulin.   Reviewed and Endorsed by [facility] Patient Education Committee, August 2015   No Smoking of any kind, Tobacco/Vaping, or Alcohol products 24 hours prior to your procedure. If you use a Cpap at night, you may bring all equipment for your overnight stay.    Day of Surgery:  Do not wear jewelry, make-up. Do Not wear  nail polish, gel polish, artificial nails, or any other type of covering on  natural nails including finger and toenails. If patients have artificial nails, gel coating, etc. that need to be removed by a nail salon please have this removed prior to surgery or surgery may need to be canceled/delayed if the surgeon/ anesthesia feels like the patient is unable to be adequately monitored.  Do not wear lotions, powders, or perfumes, or deodorant.  Do not shave 48 hours prior to surgery.  Men may shave face and neck.  Do not bring valuables to the hospital.  [facility] is not responsible for any belongings or valuables.  Contacts, dentures or bridgework may not be worn into surgery.    For patients admitted to the hospital, discharge time will be determined by your treatment team.  Patients discharged the day of surgery will not be allowed to drive home, and someone age 74 and over needs to stay with them for 24 hours.   Special instructions:  [place]- Preparing For Surgery  Before surgery, you can play an important role. Because skin is not sterile, your skin needs to be as free of germs as possible. You can reduce the number of germs on your skin by washing with CHG (chlorahexidine gluconate) Soap before surgery.  CHG is an antiseptic cleaner which kills germs and bonds with the skin to continue killing germs even after  washing.    Oral Hygiene is also important to reduce your risk of infection.  Remember - BRUSH YOUR TEETH THE MORNING OF SURGERY WITH YOUR REGULAR TOOTHPASTE  Please do not use if you have an allergy to CHG or antibacterial soaps. If your skin becomes reddened/irritated stop using the CHG.  Do not shave (including legs and underarms) for at least 48 hours prior to first CHG shower. It is OK to shave your face.  Please follow these instructions carefully.   Shower the NIGHT BEFORE SURGERY and the MORNING OF SURGERY with CHG.   If you chose to wash your hair, wash your hair  first as usual with your normal shampoo.  After you shampoo, rinse your hair and body thoroughly to remove the shampoo.  Use CHG as you would any other liquid soap. You can apply CHG directly to the skin and wash gently with a scrungie or a clean washcloth.   Apply the CHG Soap to your body ONLY FROM THE NECK DOWN.  Do not use on open wounds or open sores. Avoid contact with your eyes, ears, mouth and genitals (private parts). Wash Face and genitals (private parts)  with your normal soap.  Wash thoroughly, paying special attention to the area where your surgery will be performed.  Thoroughly rinse your body with warm water from the neck down.  DO NOT shower/wash with your normal soap after using and rinsing off the CHG Soap.  Pat yourself dry with a CLEAN TOWEL.  Wear CLEAN PAJAMAS to bed the night before surgery, wear comfortable clothes the morning of surgery  Place CLEAN SHEETS on your bed the night of your first shower and DO NOT SLEEP WITH PETS.  Reminders: Do not apply any deodorants/lotions.  Please wear clean clothes to the hospital/surgery center.   Remember to brush your teeth WITH YOUR REGULAR TOOTHPASTE.  Please read over the following fact sheets that you were given.

## 2021-06-25 MED ORDER — DEXMEDETOMIDINE HCL IN NACL 400 MCG/100ML IV SOLN
0.1000 ug/kg/h | INTRAVENOUS | Status: DC
  Filled 2021-06-25: qty 100

## 2021-06-25 MED ORDER — POTASSIUM CHLORIDE 2 MEQ/ML IV SOLN
80.0000 meq | INTRAVENOUS | Status: DC
  Filled 2021-06-25: qty 40

## 2021-06-25 MED ORDER — SODIUM CHLORIDE 0.9 % IV SOLN
INTRAVENOUS | Status: DC
  Filled 2021-06-25: qty 30

## 2021-06-25 MED ORDER — CEFAZOLIN SODIUM-DEXTROSE 2-4 GM/100ML-% IV SOLN
2.0000 g | INTRAVENOUS | Status: AC
  Administered 2021-06-26: 2 g via INTRAVENOUS
  Filled 2021-06-25: qty 100

## 2021-06-25 MED ORDER — MAGNESIUM SULFATE 50 % IJ SOLN
40.0000 meq | INTRAMUSCULAR | Status: DC
  Filled 2021-06-25: qty 9.85

## 2021-06-25 MED ORDER — NOREPINEPHRINE 4 MG/250ML-% IV SOLN
0.0000 ug/min | INTRAVENOUS | Status: AC
  Administered 2021-06-26: 1 ug/min via INTRAVENOUS
  Filled 2021-06-25: qty 250

## 2021-06-25 NOTE — H&P (Signed)
[address]       [place] [address]             [phone]      Cardiothoracic Surgery Admission History and Physical   Primary Cardiologist is Rozann Lesches, MD  PCP is Lemmie Evens, MD      Chief Complaint  Patient presents with   Aortic Stenosis       HPI:  Patient is 75 year old male with history of aortic stenosis, longstanding tobacco abuse with COPD, hypertension, peripheral arterial disease, type 2 diabetes mellitus, and GE reflux disease who has been referred for surgical consultation to discuss treatment options for management of severe symptomatic aortic stenosis.  Patient states that he has known of presence of a heart murmur for many years. He states that he was in his usual state of health without any physical limitations until fall 2019 when he was hospitalized in [place] for severe necrotizing pneumonia. He states that he never completely recovered. Echocardiogram performed at that time reportedly revealed mild aortic stenosis with normal left ventricular systolic function. The patient was seen in follow-up recently by Dr. Domenic Polite and complaining of worsening symptoms of exertional shortness of breath. Follow-up echocardiogram performed March 15, 2021 revealed normal left ventricular function with ejection fraction estimated 60 to 65%. The aortic valve appeared functionally bicuspid with severe calcification and probably severe aortic stenosis. Peak velocity across aortic valve measured 3.0 m/s but the dimensionless index was notably quite low at 0.23 and aortic valve area calculated 0.72 cm by VTI. The patient was referred to the multidisciplinary heart valve clinic and has been evaluated previously by Dr. Angelena Form. Diagnostic cardiac catheterization performed April 18, 2021 revealed mild nonobstructive coronary artery disease normal right-sided pressures and preserved cardiac output. CT angiography was performed and the patient referred for  surgical consultation.  Patient is single and lives with his close friend in [place]. He has been retired for approximately 10 years. 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 He states that he is never completely recovered from that. He is now limited by exertional shortness of breath and fatigue. He gets short of breath with low-level activity. He denies resting shortness of breath. He has not had any dizzy spells or syncope. He has poor energy. Weight has been stable. He reports intermittent productive cough and occasional wheezing. He continues to smoke cigarettes. He has some difficulty walking because of numbness involving both feet suggestive of diabetic peripheral neuropathy. However he ambulates without need for any sort of mechanical support or assistance.      Past Medical History:  Diagnosis Date   Aortic atherosclerosis ([place])    Aortic stenosis    Carotid artery disease ([facility])    COPD (chronic obstructive pulmonary disease) ([place])    Diverticulosis    Erectile dysfunction    Essential hypertension    GERD (gastroesophageal reflux disease)    Glaucoma    H/O hiatal hernia    PAD (peripheral artery disease) ([place])    Type 2 diabetes mellitus ([place])         Past Surgical History:  Procedure Laterality Date   COLONOSCOPY N/A 04/06/2013   Procedure: COLONOSCOPY; Surgeon: Jamesetta So, MD; Location: [facility]; Service: Gastroenterology; Laterality: N/A;   HERNIA REPAIR     Three prior surgeries before 1966   RIGHT/LEFT HEART CATH AND CORONARY ANGIOGRAPHY N/A 04/18/2021  Procedure: RIGHT/LEFT HEART CATH AND CORONARY ANGIOGRAPHY; Surgeon: Burnell Blanks, MD; Location: [facility]; Service: Cardiovascular; Laterality: N/A;        Family History  Problem Relation Age of Onset   Aortic stenosis Father    COPD Mother    Cancer Neg Hx    Social History        Socioeconomic History    Marital status: Divorced    Spouse name: Not on file   Number of children: 0   Years of education: Not on file   Highest education level: Not on file  Occupational History   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  Tobacco Use   Smoking status: Heavy Tobacco Smoker    Packs/day: 2.00    Years: 56.00    Pack years: 112.00    Types: Cigarettes   Smokeless tobacco: Never Used   Tobacco comment: pt refuses hand outs   Substance and Sexual Activity   Alcohol use: No   Drug use: No   Sexual activity: Not on file  Other Topics Concern   Not on file  Social History Narrative   Not on file   Social Determinants of Health   Financial Resource Strain: Not on file  Food Insecurity: Not on file  Transportation Needs: Not on file  Physical Activity: Not on file  Stress: Not on file  Social Connections: Not on file  Intimate Partner Violence: Not on file         Current Outpatient Medications  Medication Sig Dispense Refill   aspirin 325 MG EC tablet Take 325 mg by mouth daily.     brimonidine (ALPHAGAN) 0.2 % ophthalmic solution Place 1 drop into both eyes 3 (three) times daily.     diphenhydrAMINE (BENADRYL) 25 MG tablet Take 25 mg by mouth daily as needed for allergies.     esomeprazole (NEXIUM) 20 MG capsule Take 20 mg by mouth daily at 12 noon.     furosemide (LASIX) 40 MG tablet Take 40 mg by mouth every other day.     hydrochlorothiazide (HYDRODIURIL) 12.5 MG tablet Take 12.5 mg by mouth daily.     ibuprofen (ADVIL,MOTRIN) 200 MG tablet Take 800 mg by mouth every 6 (six) hours as needed for mild pain.     latanoprost (XALATAN) 0.005 % ophthalmic solution Place 1 drop into both eyes at bedtime.     Melatonin 10 MG CAPS Take 40 mg by mouth at bedtime.     metFORMIN (GLUCOPHAGE) 1000 MG tablet Take 1,000 mg by mouth 2 (two) times daily with a meal.     neomycin-bacitracin-polymyxin (NEOSPORIN) ointment Apply 1 application topically as needed for wound care.     pravastatin  (PRAVACHOL) 40 MG tablet Take 40 mg by mouth daily.     timolol (BETIMOL) 0.5 % ophthalmic solution Place 1 drop into both eyes daily.     No current facility-administered medications for this visit.        Allergies  Allergen Reactions   Macadamia Nut Oil Anaphylaxis    Throat swelling and can't breathe   Review of Systems:  General: normal appetite, decreased energy, no weight gain, no weight loss, no fever  Cardiac: no chest pain with exertion, no chest pain at rest, +SOB with exertion, no resting SOB, no PND, no orthopnea, no palpitations, no arrhythmia, no atrial fibrillation, no LE edema, no dizzy spells, no syncope  Respiratory: + shortness of breath, no home oxygen, + productive cough, no dry cough, no bronchitis, +  wheezing, no hemoptysis, no asthma, no pain with inspiration or cough, no sleep apnea, no CPAP at night  GI: no difficulty swallowing, +reflux, + frequent heartburn, no hiatal hernia, no abdominal pain, no constipation, no diarrhea, no hematochezia, no hematemesis, no melena  GU: no dysuria, + frequency, no urinary tract infection, no hematuria, no enlarged prostate, no kidney stones, no kidney disease  Vascular: no pain suggestive of claudication, no pain in feet, no leg cramps, no varicose veins, no DVT, no non-healing foot ulcer  Neuro: no stroke, no TIA's, no seizures, no headaches, no temporary blindness one eye, no slurred speech, + peripheral neuropathy, no chronic pain, mild instability of gait, no memory/cognitive dysfunction  Musculoskeletal: no arthritis, no joint swelling, no myalgias, mild difficulty walking, mildly reduced mobility  Skin: no rash, + itching, no skin infections, no pressure sores or ulcerations  Psych: no anxiety, no depression, no nervousness, no unusual recent stress  Eyes: no blurry vision, no floaters, no recent vision changes, + wears glasses or contacts  ENT: no hearing loss, + loose or painful teeth, no dentures, last saw dentist last  week - needs dental extraction  Hematologic: no easy bruising, no abnormal bleeding, no clotting disorder, no frequent epistaxis  Endocrine: + diabetes, does check CBG's at home  Physical Exam:  BP 132/80 (BP Location: Right Arm, Patient Position: Sitting, Cuff Size: Normal)  Pulse 73  Temp 98.4 F (36.9 C) (Skin)  Resp 20  Ht 6' (1.829 m)  Wt 140 lb (63.5 kg)  SpO2 97% Comment: RA  BMI 18.99 kg/m  General: Elderly, malnourished and somewhat frail-appearing  HEENT: Unremarkable except for poor dentition  Neck: no JVD, no bruits, no adenopathy  Chest: clear to auscultation, symmetrical breath sounds, no wheezes, no rhonchi  CV: RRR, grade III/VI crescendo/decrescendo murmur heard best at RSB, no diastolic murmur  Abdomen: soft, non-tender, no masses  Extremities: warm, well-perfused, pulses diminished, no LE edema  Rectal/GU Deferred  Neuro: Grossly non-focal and symmetrical throughout  Skin: Clean and dry, no rashes, no breakdown  Diagnostic Tests:  ECHOCARDIOGRAM REPORT     Patient Name: KAVION MANCINAS Date of Exam: 03/15/2021  Medical Rec #: [id number] Height: 73.0 in  Accession #: [id number] Weight: 143.4 lb  Date of Birth: 1946-04-02 BSA: 1.868 m  Patient Age: 30 years BP: 172/88 mmHg  Patient Gender: M HR: 89 bpm.  Exam Location: [facility]   Procedure: 2D Echo, Cardiac Doppler and Color Doppler   Indications: R06.02 (ICD-10-CM) - SOB (shortness of breath)   History: Patient has no prior history of Echocardiogram  examinations.  COPD; Risk Factors:Hypertension, Diabetes, Dyslipidemia  and  Current Smoker.   Sonographer: Alvino Chapel RCS  Referring Phys: [id number] [place]    1. Images are limited.  2. Left ventricular ejection fraction, by estimation, is 60 to 65%. The  left ventricle has normal function. Left ventricular endocardial border  not optimally defined to evaluate regional wall motion. There is mild left  ventricular hypertrophy.  Left  ventricular diastolic parameters are indeterminate.  3. Right ventricular systolic function is normal. The right ventricular  size is normal. Tricuspid regurgitation signal is inadequate for assessing  PA pressure.  4. The mitral valve is grossly normal. Trivial mitral valve  regurgitation.  5. The aortic valve is likely functionally bicuspid. There is severe  calcifcation of the aortic valve. Aortic valve regurgitation is trivial.  Probable severe aortic valve stenosis based on leallet motion/appearance  as well as dimentionless  index 0.23.  Aortic valve mean gradient measures 22 mmHg. Aortic valve Vmax measures  3.02 m/s.  6. The inferior vena cava is normal in size with greater than 50%  respiratory variability, suggesting right atrial pressure of 3 mmHg.   FINDINGS  Left Ventricle: Left ventricular ejection fraction, by estimation, is 60  to 65%. The left ventricle has normal function. Left ventricular  endocardial border not optimally defined to evaluate regional wall motion.  The left ventricular internal cavity  size was normal in size. There is mild left ventricular hypertrophy. Left  ventricular diastolic parameters are indeterminate.   Right Ventricle: The right ventricular size is normal. No increase in  right ventricular wall thickness. Right ventricular systolic function is  normal. Tricuspid regurgitation signal is inadequate for assessing PA  pressure.   Left Atrium: Left atrial size was normal in size.   Right Atrium: Right atrial size was normal in size.   Pericardium: There is no evidence of pericardial effusion.   Mitral Valve: The mitral valve is grossly normal. Trivial mitral valve  regurgitation.   Tricuspid Valve: The tricuspid valve is grossly normal. Tricuspid valve  regurgitation is trivial.   Aortic Valve: The aortic valve is bicuspid. There is severe calcifcation  of the aortic valve. Aortic valve regurgitation is trivial. Severe aortic   stenosis is present. Aortic valve mean gradient measures 22.0 mmHg. Aortic  valve peak gradient measures  36.5 mmHg. Aortic valve area, by VTI measures 0.72 cm.   Pulmonic Valve: The pulmonic valve was grossly normal. Pulmonic valve  regurgitation is trivial.   Aorta: The aortic root is normal in size and structure.   Venous: The inferior vena cava is normal in size with greater than 50%  respiratory variability, suggesting right atrial pressure of 3 mmHg.   IAS/Shunts: No atrial level shunt detected by color flow Doppler.    LEFT VENTRICLE  PLAX 2D  LVIDd: 4.60 cm Diastology  LVIDs: 3.20 cm LV e' medial: 6.74 cm/s  LV PW: 1.00 cm LV E/e' medial: 7.1  LV IVS: 1.10 cm LV e' lateral: 7.18 cm/s  LVOT diam: 2.00 cm LV E/e' lateral: 6.7  LV SV: 45  LV SV Index: 24  LVOT Area: 3.14 cm    RIGHT VENTRICLE  RV S prime: 12.60 cm/s  TAPSE (M-mode): 1.5 cm   LEFT ATRIUM Index RIGHT ATRIUM Index  LA diam: 3.10 cm 1.66 cm/m RA Area: 16.10 cm  LA Vol (A2C): 47.4 ml 25.37 ml/m RA Volume: 41.60 ml 22.27 ml/m  LA Vol (A4C): 49.6 ml 26.55 ml/m  LA Biplane Vol: 53.2 ml 28.48 ml/m  AORTIC VALVE  AV Area (Vmax): 0.72 cm  AV Area (Vmean): 0.83 cm  AV Area (VTI): 0.72 cm  AV Vmax: 302.00 cm/s  AV Vmean: 208.000 cm/s  AV VTI: 0.623 m  AV Peak Grad: 36.5 mmHg  AV Mean Grad: 22.0 mmHg  LVOT Vmax: 69.00 cm/s  LVOT Vmean: 55.100 cm/s  LVOT VTI: 0.143 m  LVOT/AV VTI ratio: 0.23   AORTA  Ao Root diam: 3.10 cm   MITRAL VALVE  MV Area (PHT): 1.50 cm SHUNTS  MV Decel Time: 507 msec Systemic VTI: 0.14 m  MV E velocity: 48.00 cm/s Systemic Diam: 2.00 cm  MV A velocity: 82.10 cm/s  MV E/A ratio: 0.58   Rozann Lesches MD  Electronically signed by Rozann Lesches MD  Signature Date/Time: 03/15/2021/11:33:52 AM  EKG: NSR w/out significant AV conduction delay (03/26/2021)  RIGHT/LEFT HEART CATH AND CORONARY ANGIOGRAPHY  Conclusion  1. No angiographic evidence of CAD  2.  Paradoxical low flow/low gradient Severe aortic stenosis by echo (by cath: mean gradient 11.8 mmHg, peak to peak gradient 11 mmHg)  3. RA 1, RV 29/0/1, PA 27/10 (mean 16), PCWP 2, LV 132/1/3, AO 124/61  Recommendations: Will continue workup for TAVR.  Indications  Severe aortic stenosis [I35.0 (ICD-10-CM)]  Procedural Details  Technical Details Indication: Severe aortic stenosis  Procedure: The risks, benefits, complications, treatment options, and expected outcomes were discussed with the patient. The patient and/or family concurred with the proposed plan, giving informed consent. The patient was brought to the cath lab after IV hydration was given. The patient was sedated with Versed and Fentanyl. The IV catheter in the right antecubital vein was changed for a 7 [place] sheath. Right heart catheterization performed with a balloon tipped catheter. The right wrist was prepped and draped in a sterile fashion. 1% lidocaine was used for local anesthesia. Using the modified Seldinger access technique, a 5 French sheath was placed in the right radial artery. 3 mg Verapamil was given through the sheath. 3500 units IV heparin was given. Standard diagnostic catheters were used to perform selective coronary angiography. The aortic valve was crossed with an AL-1 and the J wire. No LV gram. The sheath was removed from the right radial artery and a Terumo hemostasis band was applied at the arteriotomy site on the right wrist.  \ Estimated blood loss <50 mL.   During this procedure medications were administered to achieve and maintain moderate conscious sedation while the patient's heart rate, blood pressure, and oxygen saturation were continuously monitored and I was present face-to-face 100% of this time.  Medications  (Filter: Administrations occurring from 1103 to 1210 on 04/18/21)  (important) Continuous medications are totaled by the amount administered until 04/18/21 1210.  fentaNYL (SUBLIMAZE) injection  (mcg)  Total dose: 50 mcg  Date/Time Rate/Dose/Volume Action   04/18/21 1121 50 mcg Given   midazolam (VERSED) injection (mg)  Total dose: 2 mg  Date/Time Rate/Dose/Volume Action   04/18/21 1121 2 mg Given   lidocaine (PF) (XYLOCAINE) 1 % injection (mL)  Total volume: 4 mL  Date/Time Rate/Dose/Volume Action   04/18/21 1130 4 mL Given   Radial Cocktail/Verapamil only (mL)  Total volume: 10 mL  Date/Time Rate/Dose/Volume Action   04/18/21 1139 10 mL Given   Heparin (Porcine) in NaCl 1000-0.9 UT/500ML-% SOLN (mL)  Total volume: 1,000 mL  Date/Time Rate/Dose/Volume Action   04/18/21 1140 500 mL Given   1140 500 mL Given   heparin sodium (porcine) injection (Units)  Total dose: 3,500 Units  Date/Time Rate/Dose/Volume Action   04/18/21 1142 3,500 Units Given   iohexol (OMNIPAQUE) 350 MG/ML injection (mL)  Total volume: 55 mL  Date/Time Rate/Dose/Volume Action   04/18/21 1158 55 mL Given   Sedation Time  Sedation Time Physician-1: 34 minutes 28 seconds  Contrast  Medication Name Total Dose  iohexol (OMNIPAQUE) 350 MG/ML injection 55 mL  Radiation/Fluoro  Fluoro time: 7.8 (min)  DAP: 12278 (mGycm2)  Cumulative Air Kerma: 229 (mGy)  Complications  Complications documented before study signed (04/18/2021 12:16 PM)   RIGHT/LEFT HEART CATH AND CORONARY ANGIOGRAPHY   None Documented by Burnell Blanks, MD 04/18/2021 12:04 PM  Date Found: 04/18/2021  Time Range: Intraprocedure    Coronary Findings  Diagnostic  Dominance: Right  Left Anterior Descending  Vessel is large.  Left Circumflex  Vessel is large.  Right Coronary Artery  Vessel is large.  Intervention  No interventions have been documented.  Coronary Diagrams  Diagnostic  Dominance: Right   Intervention  Implants  No implant documentation for this case.   Syngo Images  Show images for CARDIAC CATHETERIZATION  Images on Long Term Storage  Show images for Haneef, Hallquist to Procedure Log    Procedure  Log  Hemo Data  Flowsheet Row Most Recent Value  Fick Cardiac Output 5.3 L/min  Fick Cardiac Output Index 2.86 (L/min)/BSA  Aortic Mean Gradient 11.79 mmHg  Aortic Peak Gradient 11 mmHg  Aortic Valve Area 1.87  Aortic Value Area Index 1.01 cm2/BSA  RA A Wave 2 mmHg  RA V Wave 1 mmHg  RA Mean 0 mmHg  RV Systolic Pressure 29 mmHg  RV Diastolic Pressure 0 mmHg  RV EDP 1 mmHg  PA Systolic Pressure 27 mmHg  PA Diastolic Pressure 10 mmHg  PA Mean 16 mmHg  PW A Wave 3 mmHg  PW V Wave 3 mmHg  PW Mean 2 mmHg  AO Systolic Pressure 027 mmHg  AO Diastolic Pressure 58 mmHg  AO Mean 80 mmHg  LV Systolic Pressure 741 mmHg  LV Diastolic Pressure 1 mmHg  LV EDP 3 mmHg  AOp Systolic Pressure 287 mmHg  AOp Diastolic Pressure 61 mmHg  AOp Mean Pressure 86 mmHg  LVp Systolic Pressure 867 mmHg  LVp Diastolic Pressure 1 mmHg  LVp EDP Pressure 4 mmHg  QP/QS 1  TPVR Index 5.6 HRUI  TSVR Index 28 HRUI  TPVR/TSVR Ratio 0.2  Cardiac TAVR CT  TECHNIQUE:  The patient was scanned on a [employer]. A 90 kV  retrospective scan was triggered in the descending thoracic aorta at  111 HU's. Gantry rotation speed was 250 msecs and collimation was .6  mm. No beta blockade or nitro were given. The 3D data set was  reconstructed in 5% intervals of the R-R cycle. Systolic and  diastolic phases were analyzed on a dedicated work station using  MPR, MIP and VRT modes. The patient received 100 cc of contrast.  FINDINGS:  Image quality: Excellent.  Noise artifact is: Limited.  Valve Morphology: The aortic valve is tricuspid. The leaflets are  severely calcified with diffuse calcifications. The LCC is immobile.  The other leaflets demonstrate severely reduced motion in systole.  Aortic Valve Calcium score: 1485  Aortic annular dimension:  Phase assessed: 25%  Annular area: 658 mm2  Annular perimeter: 91.8 mm  Max diameter: 30.8 mm  Min diameter: 28.1 mm  Annular and subannular calcification:  None.  Optimal coplanar projection: LAO 1 CAU 6  Coronary Artery Height above Annulus:  Left Main: 9.6 mm  Right Coronary: 21.1 mm  Sinus of Valsalva Measurements:  Non-coronary: 36 mm  Right-coronary: 36 mm  Left-coronary: 36 mm  Sinus of Valsalva Height:  Non-coronary: 24.6 mm  Right-coronary: 27.7 mm  Left-coronary: 21.7 mm  Sinotubular Junction: 34 mm  Ascending Thoracic Aorta: Diffuse ectasia noted, up to 42 mm  (double-oblique)  Coronary Arteries: Normal coronary origin. Right dominance. The  study was performed without use of NTG and is insufficient for  plaque evaluation. Please refer to recent cardiac catheterization  for coronary assessment. Mild coronary calcifications noted.  Cardiac Morphology:  Right Atrium: Right atrial size is within normal limits.  Right Ventricle: The right ventricular cavity is within normal  limits.  Left Atrium: Left atrial size is normal in size with no left atrial  appendage filling defect.  Left Ventricle: The ventricular cavity size is  within normal limits.  There are no stigmata of prior infarction. There is no abnormal  filling defect. Normal left ventricular function, LVEF 56%. No  regional wall motion abnormalities.  Pulmonary arteries: Normal in size without proximal filling defect.  Pulmonary veins: Normal pulmonary venous drainage.  Pericardium: Normal thickness with no significant effusion or  calcium present.  Mitral Valve: The mitral valve is normal structure without  significant calcification.  Extra-cardiac findings: See attached radiology report for  non-cardiac structures.  IMPRESSION:  1. Tricuspid aortic valve with severe diffuse calcifications.  2. Annular measurements appropriate for 29 mm S[place]ien 3 TAVR (658  mm2).  3. No significant annular or subannular calcifications.  4. Borderline left main coronary height (9.6 mm) but large aortic  sinuses noted (36 mm).  5. Optimal Fluoroscopic Angle for Delivery: LAO 1  CAU 6.  6. Diffusely ectatic of the ascending aorta noted that is mildly  dilated (42 mm double-oblique).  [place] T. Audie Box, MD  Electronically Signed  By: Eleonore Chiquito  On: 04/27/2021 14:22  CT ANGIOGRAPHY CHEST, ABDOMEN AND PELVIS  TECHNIQUE:  Multidetector CT imaging through the chest, abdomen and pelvis was  performed using the standard protocol during bolus administration of  intravenous contrast. Multiplanar reconstructed images and MIPs were  obtained and reviewed to evaluate the vascular anatomy.  CONTRAST: 15mL OMNIPAQUE IOHEXOL 350 MG/ML SOLN  COMPARISON: Chest CTA 03/01/2019. No prior CT the abdomen or  pelvis.  FINDINGS:  CTA CHEST FINDINGS  Cardiovascular: Heart size is borderline enlarged. There is no  significant pericardial fluid, thickening or pericardial  calcification. There is aortic atherosclerosis, as well as  atherosclerosis of the great vessels of the mediastinum and the  coronary arteries, including calcified atherosclerotic plaque in the  left anterior descending coronary artery. Severe thickening and  calcification of the aortic valve. Ectasia of ascending thoracic  aorta (4.0 cm in diameter).  Mediastinum/Lymph Nodes: No pathologically enlarged mediastinal or  hilar lymph nodes. Esophagus is unremarkable in appearance. No  axillary lymphadenopathy.  Lungs/Pleura: Architectural distortion in the anterior aspect of the  left upper lobe, similar to prior examinations, most compatible with  areas of chronic post infectious or inflammatory scarring. No  definite suspicious appearing pulmonary nodules or masses are noted.  No acute consolidative airspace disease. No pleural effusions.  Diffuse bronchial wall thickening with moderate centrilobular and  paraseptal emphysema.  Musculoskeletal/Soft Tissues: There are no aggressive appearing  lytic or blastic lesions noted in the visualized portions of the  skeleton.  CTA ABDOMEN AND PELVIS FINDINGS   Hepatobiliary: 1.3 x 0.9 cm low-attenuation lesion in segment 3 of  the liver, compatible with a simple cyst. No other suspicious  hepatic lesions. No intra or extrahepatic biliary ductal dilatation.  Gallbladder is normal in appearance.  Pancreas: Pancreas is severely atrophic with extensive coarse  parenchymal calcifications, indicative of chronic pancreatitis. No  pancreatic mass. No peripancreatic fluid collections or inflammatory  changes.  Spleen: Unremarkable.  Adrenals/Urinary Tract: 1.5 x 1.0 cm low-attenuation lesion in the  anterior aspect of the upper pole the right kidney, compatible with  a simple cyst. Left kidney and bilateral adrenal glands are normal  in appearance. No hydroureteronephrosis. Urinary bladder is  unremarkable in appearance.  Stomach/Bowel: Stomach is decompressed. Allowing for this, there is  potential diffuse mural thickening throughout the stomach. No  pathologic dilatation of small bowel or colon. The appendix is not  confidently identified and may be surgically absent. Regardless,  there are no inflammatory changes noted  adjacent to the cecum to  suggest the presence of an acute appendicitis at this time.  Vascular/Lymphatic: Aortic atherosclerosis with fusiform aneurysmal  dilatation of the infrarenal abdominal aorta which measures up to  3.1 x 2.9 cm. 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 Vascular findings and measurements pertinent to  potential TAVR procedure, as detailed below.  Reproductive: Prostate gland and seminal vesicles are unremarkable  in appearance.  Other: No significant volume of ascites. No pneumoperitoneum.  Musculoskeletal: There are no aggressive appearing lytic or blastic  lesions noted in the  visualized portions of the skeleton.  VASCULAR MEASUREMENTS PERTINENT TO TAVR:  AORTA:  Minimal Aortic Diameter-17 x 14 mm  Severity of Aortic Calcification-moderate to severe  RIGHT PELVIS:  Right Common Iliac Artery -  Minimal Diameter-10.9 x 7.0 mm  Tortuosity-mild  Calcification - severe  Right External Iliac Artery -  Minimal Diameter-5.9 x 4.3 mm  Tortuosity-severe  Calcification - severe  Right Common Femoral Artery -  Minimal Diameter-6.9 x 5.4 mm  Tortuosity-mild  Calcification - severe  LEFT PELVIS:  Left Common Iliac Artery -  Minimal Diameter-10.0 x 6.5 mm  Tortuosity-moderate  Calcification - severe  Left External Iliac Artery -  Minimal Diameter-completely occluded  Tortuosity-severe  Calcification-severe  Left Common Femoral Artery -  Minimal Diameter-completely occluded  Tortuosity-mild  Calcification - severe  Review of the MIP images confirms the above findings.  IMPRESSION:  1. Vascular findings and measurements pertinent to potential TAVR  procedure, most notable for complete occlusion of the left common  femoral and external iliac arteries, as detailed above.  2. Severe thickening calcification of the aortic valve, compatible  with reported clinical history of severe aortic stenosis.  3. Aortic atherosclerosis, in addition to left anterior descending  coronary artery disease. There is also ectasia of ascending thoracic  aorta (4.0 cm in diameter), mild fusiform aneurysmal dilatation of  the infrarenal abdominal aorta (3.1 x 2.9 cm) and aneurysmal  dilatation of the left common iliac artery (2.1 cm in diameter).  4. Diffuse bronchial wall thickening with moderate centrilobular and  paraseptal emphysema; imaging findings suggestive of underlying  COPD.  5. Stigmata of chronic pancreatitis, as above.  6. Possible diffuse mural thickening throughout the stomach.  Clinical correlation for gastritis is recommended. The possibility  of infiltrative  gastric neoplasm is not entirely excluded.  Consideration for further evaluation with nonemergent endoscopy is  recommended if clinically appropriate.  7. Additional incidental findings, as above.  Electronically Signed  By: Vinnie Langton M.D.  On: 04/28/2021 06:40    Impression:  Patient has [place] type I bicuspid aortic valve with stage D3 severe symptomatic aortic stenosis. He describes gradual worsening of symptoms of exertional shortness of breath and fatigue consistent with chronic diastolic congestive heart failure, [place] Heart Association functional class IIb bordering on class III. Some of his symptoms of shortness of breath may also be related to his underlying chronic lung disease with longstanding and ongoing tobacco abuse.   I have personally reviewed the patient's recent transthoracic echocardiogram, EKG, diagnostic cardiac catheterization, and CT angiograms. Echocardiogram is notable for marginal acoustic windows but findings consistent with severe aortic stenosis. The patient appears to have Sievers type I bicuspid aortic valve with severe calcification, thickening, and restricted leaflet mobility involving all 3  of the leaflets. One of them appears essentially immobile. Peak velocity across aortic valve measured just over 3 m/s corresponding to mean transvalvular gradient estimated 22 mmHg but aortic valve area calculated only 0.72 cm. The DVI was notably quite low at 0.23 and stroke-volume index only 24. Left ventricular ejection fraction was reported 60 to 65% although I believe that number is generous and systolic function is probably mildly reduced. Diagnostic cardiac catheterization confirmed the presence of aortic stenosis and revealed only mild nonobstructive coronary artery disease. Right heart pressures were normal. Cardiac-gated CTA of the heart reveals anatomical characteristics consistent with aortic stenosis suitable for treatment by transcatheter aortic valve  replacement without any significant complicating features other than mild fusiform ectasia of the proximal ascending aorta with maximum transverse diameter just over 4.0 cm. Options include conventional surgical aortic valve replacement with or without plication or graft replacement of the proximal aorta versus transcatheter aortic valve replacement. However, the patient has underlying severe COPD and I am very concerned that he would not do well with conventional surgery. CTA of the aorta and iliac vessels demonstrates both severe tortuosity and atherosclerotic disease. Although it is conceivable that access could be obtained through the right femoral approach, I am concerned by the severity of tortuosity and atherosclerotic disease throughout and would favor alternative access via left subclavian approach.   Plan:   The patient and his close friend were counseled at length regarding treatment alternatives for management of severe symptomatic aortic stenosis. Alternative approaches such as conventional aortic valve replacement, transcatheter aortic valve replacement, and continued medical therapy without intervention were compared and contrasted at length. The risks associated with conventional surgical aortic valve replacement were discussed in detail, as were expectations for post-operative convalescence, and why I would be reluctant to consider this patient a candidate for conventional surgery. Issues specific to transcatheter aortic valve replacement were discussed including questions about long term valve durability, the potential for paravalvular leak, possible increased risk of need for permanent pacemaker placement, and other technical complications related to the procedure itself. We also discussed the presence of significant aortoiliac disease and the possible need for alternative access to facilitate transcatheter valve replacement. Long-term prognosis with medical therapy was discussed. This  discussion was placed in the context of the patient's own specific clinical presentation and past medical history. All of their questions have been addressed. The patient is interested in proceeding with transcatheter aortic valve replacement via left subclavian approach in the near future. This will be scheduled once dental extraction has been completed.  Following the decision to proceed with transcatheter aortic valve replacement, a discussion has been held regarding what types of management strategies would be attempted intraoperatively in the event of life-threatening complications, including whether or not the patient would be considered a candidate for the use of cardiopulmonary bypass and/or conversion to open sternotomy for attempted surgical intervention. The patient specifically requests that should a potentially life-threatening complication develop we would attempt emergency median sternotomy and/or other aggressive surgical procedures. The patient has been advised of a variety of complications that might develop including but not limited to risks of death, stroke, paravalvular leak, aortic dissection or other major vascular complications, aortic annulus rupture, device embolization, cardiac rupture or perforation, mitral regurgitation, acute myocardial infarction, arrhythmia, heart block or bradycardia requiring permanent pacemaker placement, congestive heart failure, respiratory failure, renal failure, pneumonia, infection, other late complications related to structural valve deterioration or migration, or other complications that might ultimately cause a temporary or permanent loss  of functional independence or other long term morbidity. The patient provides full informed consent for the procedure as described and all questions were answered.    Gaye Pollack, MD

## 2021-06-26 ENCOUNTER — Inpatient Hospital Stay (HOSPITAL_COMMUNITY)
Admission: RE | Admit: 2021-06-26 | Discharge: 2021-06-27 | DRG: 267 | Disposition: A | Discharge status: Home / Self Care | Payer: Medicare Other | Attending: Surgery | Admitting: Surgery

## 2021-06-26 ENCOUNTER — Inpatient Hospital Stay (HOSPITAL_COMMUNITY): Payer: Medicare Other

## 2021-06-26 ENCOUNTER — Inpatient Hospital Stay (HOSPITAL_COMMUNITY): Payer: Medicare Other | Admitting: Physician Assistant

## 2021-06-26 ENCOUNTER — Encounter (HOSPITAL_COMMUNITY): Payer: Self-pay | Admitting: Cardiovascular Disease

## 2021-06-26 ENCOUNTER — Other Ambulatory Visit: Payer: Self-pay | Admitting: Physician Assistant

## 2021-06-26 ENCOUNTER — Encounter (HOSPITAL_COMMUNITY)
Admission: RE | Disposition: A | Discharge status: Home / Self Care | Payer: Self-pay | Source: Home / Self Care | Attending: Surgery

## 2021-06-26 ENCOUNTER — Inpatient Hospital Stay (HOSPITAL_COMMUNITY): Payer: Medicare Other | Admitting: Anesthesiology

## 2021-06-26 ENCOUNTER — Encounter: Payer: Self-pay | Admitting: Physician Assistant

## 2021-06-26 ENCOUNTER — Other Ambulatory Visit: Payer: Self-pay

## 2021-06-26 DIAGNOSIS — Z006 Encounter for examination for normal comparison and control in clinical research program: Secondary | ICD-10-CM

## 2021-06-26 DIAGNOSIS — Z952 Presence of prosthetic heart valve: Secondary | ICD-10-CM

## 2021-06-26 DIAGNOSIS — Z7982 Long term (current) use of aspirin: Secondary | ICD-10-CM

## 2021-06-26 DIAGNOSIS — H409 Unspecified glaucoma: Secondary | ICD-10-CM | POA: Diagnosis present

## 2021-06-26 DIAGNOSIS — E785 Hyperlipidemia, unspecified: Secondary | ICD-10-CM | POA: Diagnosis present

## 2021-06-26 DIAGNOSIS — J984 Other disorders of lung: Secondary | ICD-10-CM

## 2021-06-26 DIAGNOSIS — I350 Nonrheumatic aortic (valve) stenosis: Principal | ICD-10-CM | POA: Diagnosis present

## 2021-06-26 DIAGNOSIS — Z825 Family history of asthma and other chronic lower respiratory diseases: Secondary | ICD-10-CM | POA: Diagnosis not present

## 2021-06-26 DIAGNOSIS — I10 Essential (primary) hypertension: Secondary | ICD-10-CM | POA: Diagnosis present

## 2021-06-26 DIAGNOSIS — Z91018 Allergy to other foods: Secondary | ICD-10-CM

## 2021-06-26 DIAGNOSIS — J449 Chronic obstructive pulmonary disease, unspecified: Secondary | ICD-10-CM | POA: Diagnosis present

## 2021-06-26 DIAGNOSIS — E1151 Type 2 diabetes mellitus with diabetic peripheral angiopathy without gangrene: Secondary | ICD-10-CM | POA: Diagnosis present

## 2021-06-26 DIAGNOSIS — F17210 Nicotine dependence, cigarettes, uncomplicated: Secondary | ICD-10-CM | POA: Diagnosis present

## 2021-06-26 DIAGNOSIS — I2510 Atherosclerotic heart disease of native coronary artery without angina pectoris: Secondary | ICD-10-CM | POA: Diagnosis present

## 2021-06-26 DIAGNOSIS — Z79899 Other long term (current) drug therapy: Secondary | ICD-10-CM | POA: Diagnosis not present

## 2021-06-26 DIAGNOSIS — I700 Atherosclerosis of aorta: Secondary | ICD-10-CM | POA: Diagnosis present

## 2021-06-26 DIAGNOSIS — Z7984 Long term (current) use of oral hypoglycemic drugs: Secondary | ICD-10-CM | POA: Diagnosis not present

## 2021-06-26 DIAGNOSIS — K219 Gastro-esophageal reflux disease without esophagitis: Secondary | ICD-10-CM | POA: Diagnosis present

## 2021-06-26 DIAGNOSIS — E119 Type 2 diabetes mellitus without complications: Secondary | ICD-10-CM

## 2021-06-26 DIAGNOSIS — I739 Peripheral vascular disease, unspecified: Secondary | ICD-10-CM | POA: Insufficient documentation

## 2021-06-26 DIAGNOSIS — F17200 Nicotine dependence, unspecified, uncomplicated: Secondary | ICD-10-CM | POA: Diagnosis present

## 2021-06-26 DIAGNOSIS — Z954 Presence of other heart-valve replacement: Secondary | ICD-10-CM | POA: Diagnosis not present

## 2021-06-26 HISTORY — DX: Presence of prosthetic heart valve: Z95.2

## 2021-06-26 HISTORY — PX: TEE WITHOUT CARDIOVERSION: SHX5443

## 2021-06-26 HISTORY — DX: Nonrheumatic aortic (valve) stenosis: I35.0

## 2021-06-26 LAB — POCT I-STAT, CHEM 8
BUN: 11 mg/dL (ref 8–23)
BUN: 11 mg/dL (ref 8–23)
BUN: 11 mg/dL (ref 8–23)
BUN: 11 mg/dL (ref 8–23)
Calcium, Ion: 1.3 mmol/L (ref 1.15–1.40)
Calcium, Ion: 1.19 mmol/L (ref 1.15–1.40)
Calcium, Ion: 1.28 mmol/L (ref 1.15–1.40)
Calcium, Ion: 1.29 mmol/L (ref 1.15–1.40)
Chloride: 99 mmol/L (ref 98–111)
Chloride: 100 mmol/L (ref 98–111)
Chloride: 100 mmol/L (ref 98–111)
Chloride: 100 mmol/L (ref 98–111)
Creatinine, Ser: 0.5 mg/dL — ABNORMAL LOW (ref 0.61–1.24)
Creatinine, Ser: 0.5 mg/dL — ABNORMAL LOW (ref 0.61–1.24)
Creatinine, Ser: 0.6 mg/dL — ABNORMAL LOW (ref 0.61–1.24)
Creatinine, Ser: 0.6 mg/dL — ABNORMAL LOW (ref 0.61–1.24)
Glucose, Bld: 225 mg/dL — ABNORMAL HIGH (ref 70–99)
Glucose, Bld: 194 mg/dL — ABNORMAL HIGH (ref 70–99)
Glucose, Bld: 164 mg/dL — ABNORMAL HIGH (ref 70–99)
Glucose, Bld: 65 mg/dL — ABNORMAL LOW (ref 70–99)
HCT: 33 % — ABNORMAL LOW (ref 39.0–52.0)
HCT: 33 % — ABNORMAL LOW (ref 39.0–52.0)
HCT: 33 % — ABNORMAL LOW (ref 39.0–52.0)
HCT: 36 % — ABNORMAL LOW (ref 39.0–52.0)
Hemoglobin: 11.2 g/dL — ABNORMAL LOW (ref 13.0–17.0)
Hemoglobin: 11.2 g/dL — ABNORMAL LOW (ref 13.0–17.0)
Hemoglobin: 11.2 g/dL — ABNORMAL LOW (ref 13.0–17.0)
Hemoglobin: 12.2 g/dL — ABNORMAL LOW (ref 13.0–17.0)
Potassium: 4.5 mmol/L (ref 3.5–5.1)
Potassium: 4.3 mmol/L (ref 3.5–5.1)
Potassium: 3.7 mmol/L (ref 3.5–5.1)
Potassium: 4.2 mmol/L (ref 3.5–5.1)
Sodium: 134 mmol/L — ABNORMAL LOW (ref 135–145)
Sodium: 134 mmol/L — ABNORMAL LOW (ref 135–145)
Sodium: 136 mmol/L (ref 135–145)
Sodium: 137 mmol/L (ref 135–145)
TCO2: 26 mmol/L (ref 22–32)
TCO2: 28 mmol/L (ref 22–32)
TCO2: 26 mmol/L (ref 22–32)
TCO2: 29 mmol/L (ref 22–32)

## 2021-06-26 LAB — ECHO TEE
AR max vel: 1.26 cm2
AV Area VTI: 1.13 cm2
AV Area mean vel: 1.25 cm2
AV Mean grad: 16.4 mmHg
AV Peak grad: 29.8 mmHg
Ao pk vel: 2.73 m/s

## 2021-06-26 LAB — GLUCOSE, CAPILLARY
Glucose-Capillary: 214 mg/dL — ABNORMAL HIGH (ref 70–99)
Glucose-Capillary: 67 mg/dL — ABNORMAL LOW (ref 70–99)
Glucose-Capillary: 73 mg/dL (ref 70–99)
Glucose-Capillary: 109 mg/dL — ABNORMAL HIGH (ref 70–99)
Glucose-Capillary: 172 mg/dL — ABNORMAL HIGH (ref 70–99)
Glucose-Capillary: 75 mg/dL (ref 70–99)
Glucose-Capillary: 86 mg/dL (ref 70–99)

## 2021-06-26 LAB — POCT I-STAT 7, (LYTES, BLD GAS, ICA,H+H)
Acid-base deficit: 1 mmol/L (ref 0.0–2.0)
Acid-base deficit: 1 mmol/L (ref 0.0–2.0)
Bicarbonate: 26.7 mmol/L (ref 20.0–28.0)
Bicarbonate: 26.3 mmol/L (ref 20.0–28.0)
Calcium, Ion: 1.23 mmol/L (ref 1.15–1.40)
Calcium, Ion: 1.25 mmol/L (ref 1.15–1.40)
HCT: 35 % — ABNORMAL LOW (ref 39.0–52.0)
HCT: 35 % — ABNORMAL LOW (ref 39.0–52.0)
Hemoglobin: 11.9 g/dL — ABNORMAL LOW (ref 13.0–17.0)
Hemoglobin: 11.9 g/dL — ABNORMAL LOW (ref 13.0–17.0)
O2 Saturation: 100 %
O2 Saturation: 100 %
Potassium: 4.5 mmol/L (ref 3.5–5.1)
Potassium: 4.3 mmol/L (ref 3.5–5.1)
Sodium: 134 mmol/L — ABNORMAL LOW (ref 135–145)
Sodium: 134 mmol/L — ABNORMAL LOW (ref 135–145)
TCO2: 29 mmol/L (ref 22–32)
TCO2: 28 mmol/L (ref 22–32)
pCO2 arterial: 60.3 mmHg — ABNORMAL HIGH (ref 32.0–48.0)
pCO2 arterial: 53.9 mmHg — ABNORMAL HIGH (ref 32.0–48.0)
pH, Arterial: 7.255 — ABNORMAL LOW (ref 7.350–7.450)
pH, Arterial: 7.296 — ABNORMAL LOW (ref 7.350–7.450)
pO2, Arterial: 389 mmHg — ABNORMAL HIGH (ref 83.0–108.0)
pO2, Arterial: 263 mmHg — ABNORMAL HIGH (ref 83.0–108.0)

## 2021-06-26 SURGERY — IMPLANTATION, AORTIC VALVE, TRANSCATHETER, SUBCLAVIAN ARTERY APPROACH
Anesthesia: General | Site: Chest

## 2021-06-26 MED ORDER — SODIUM CHLORIDE 0.9 % IV SOLN: INTRAVENOUS | Status: AC

## 2021-06-26 MED ORDER — ORAL CARE MOUTH RINSE: 15.0000 mL | Freq: Once | OROMUCOSAL | Status: DC

## 2021-06-26 MED ORDER — OXYCODONE HCL 5 MG PO TABS
5.0000 mg | ORAL_TABLET | ORAL | Status: DC | PRN
  Administered 2021-06-26: 10 mg via ORAL
  Filled 2021-06-26: qty 2

## 2021-06-26 MED ORDER — ACETAMINOPHEN 325 MG PO TABS: 650.0000 mg | ORAL_TABLET | Freq: Four times a day (QID) | ORAL | Status: DC | PRN

## 2021-06-26 MED ORDER — SUGAMMADEX SODIUM 200 MG/2ML IV SOLN
INTRAVENOUS | Status: DC | PRN
  Administered 2021-06-26: 200 mg via INTRAVENOUS

## 2021-06-26 MED ORDER — HEPARIN SODIUM (PORCINE) 1000 UNIT/ML IJ SOLN
INTRAMUSCULAR | Status: AC
  Filled 2021-06-26: qty 2

## 2021-06-26 MED ORDER — TRAMADOL HCL 50 MG PO TABS
50.0000 mg | ORAL_TABLET | ORAL | Status: DC | PRN
  Administered 2021-06-26: 100 mg via ORAL
  Filled 2021-06-26: qty 2

## 2021-06-26 MED ORDER — SODIUM CHLORIDE 0.9 % IV SOLN: INTRAVENOUS | Status: DC

## 2021-06-26 MED ORDER — FENTANYL CITRATE (PF) 250 MCG/5ML IJ SOLN
INTRAMUSCULAR | Status: DC | PRN
  Administered 2021-06-26 (×3): 50 ug via INTRAVENOUS

## 2021-06-26 MED ORDER — ROCURONIUM BROMIDE 10 MG/ML (PF) SYRINGE
PREFILLED_SYRINGE | INTRAVENOUS | Status: DC | PRN
  Administered 2021-06-26: 60 mg via INTRAVENOUS

## 2021-06-26 MED ORDER — PHENYLEPHRINE 40 MCG/ML (10ML) SYRINGE FOR IV PUSH (FOR BLOOD PRESSURE SUPPORT)
PREFILLED_SYRINGE | INTRAVENOUS | Status: AC
  Filled 2021-06-26: qty 10

## 2021-06-26 MED ORDER — SODIUM CHLORIDE 0.9 % IV SOLN
INTRAVENOUS | Status: AC
  Filled 2021-06-26 (×3): qty 1.2

## 2021-06-26 MED ORDER — SODIUM CHLORIDE 0.9 % IV SOLN
INTRAVENOUS | Status: DC | PRN
  Administered 2021-06-26 (×3): 500 mL

## 2021-06-26 MED ORDER — MELATONIN 5 MG PO TABS: 30.0000 mg | ORAL_TABLET | Freq: Every evening | ORAL | Status: DC | PRN

## 2021-06-26 MED ORDER — LIDOCAINE 2% (20 MG/ML) 5 ML SYRINGE
INTRAMUSCULAR | Status: DC | PRN
  Administered 2021-06-26: 50 mg via INTRAVENOUS

## 2021-06-26 MED ORDER — INSULIN ASPART 100 UNIT/ML IJ SOLN
0.0000 [IU] | Freq: Three times a day (TID) | INTRAMUSCULAR | Status: DC
  Administered 2021-06-26 – 2021-06-27 (×2): 4 [IU] via SUBCUTANEOUS

## 2021-06-26 MED ORDER — PHENYLEPHRINE HCL-NACL 20-0.9 MG/250ML-% IV SOLN
0.0000 ug/min | INTRAVENOUS | Status: DC
  Filled 2021-06-26: qty 250

## 2021-06-26 MED ORDER — PROTAMINE SULFATE 10 MG/ML IV SOLN
INTRAVENOUS | Status: AC
  Filled 2021-06-26: qty 10

## 2021-06-26 MED ORDER — CEFAZOLIN SODIUM-DEXTROSE 2-4 GM/100ML-% IV SOLN
2.0000 g | Freq: Three times a day (TID) | INTRAVENOUS | Status: AC
  Administered 2021-06-26 (×2): 2 g via INTRAVENOUS
  Filled 2021-06-26 (×2): qty 100

## 2021-06-26 MED ORDER — SODIUM CHLORIDE 0.9% FLUSH
3.0000 mL | Freq: Two times a day (BID) | INTRAVENOUS | Status: DC
  Administered 2021-06-26 – 2021-06-27 (×2): 3 mL via INTRAVENOUS

## 2021-06-26 MED ORDER — ASPIRIN 81 MG PO CHEW
81.0000 mg | CHEWABLE_TABLET | Freq: Every day | ORAL | Status: DC
  Administered 2021-06-27: 81 mg via ORAL
  Filled 2021-06-26: qty 1

## 2021-06-26 MED ORDER — BRIMONIDINE TARTRATE 0.2 % OP SOLN
1.0000 [drp] | Freq: Three times a day (TID) | OPHTHALMIC | Status: DC
  Administered 2021-06-26 – 2021-06-27 (×2): 1 [drp] via OPHTHALMIC
  Filled 2021-06-26: qty 5

## 2021-06-26 MED ORDER — IODIXANOL 320 MG/ML IV SOLN
INTRAVENOUS | Status: DC | PRN
  Administered 2021-06-26: 60 mL via INTRA_ARTERIAL

## 2021-06-26 MED ORDER — CHLORHEXIDINE GLUCONATE 4 % EX LIQD: 30.0000 mL | CUTANEOUS | Status: DC

## 2021-06-26 MED ORDER — PROPOFOL 10 MG/ML IV BOLUS
INTRAVENOUS | Status: AC
  Filled 2021-06-26: qty 20

## 2021-06-26 MED ORDER — CHLORHEXIDINE GLUCONATE 0.12 % MT SOLN
15.0000 mL | Freq: Once | OROMUCOSAL | Status: DC
  Filled 2021-06-26: qty 15

## 2021-06-26 MED ORDER — CLOPIDOGREL BISULFATE 75 MG PO TABS
75.0000 mg | ORAL_TABLET | Freq: Every day | ORAL | Status: DC
  Administered 2021-06-27: 75 mg via ORAL
  Filled 2021-06-26: qty 1

## 2021-06-26 MED ORDER — LACTATED RINGERS IV SOLN: INTRAVENOUS | Status: DC

## 2021-06-26 MED ORDER — LACTATED RINGERS IV SOLN: INTRAVENOUS | Status: DC | PRN

## 2021-06-26 MED ORDER — NITROGLYCERIN IN D5W 200-5 MCG/ML-% IV SOLN: 0.0000 ug/min | INTRAVENOUS | Status: DC

## 2021-06-26 MED ORDER — TIMOLOL MALEATE 0.5 % OP SOLN
1.0000 [drp] | Freq: Every day | OPHTHALMIC | Status: DC
  Administered 2021-06-27: 1 [drp] via OPHTHALMIC
  Filled 2021-06-26: qty 5

## 2021-06-26 MED ORDER — LATANOPROST 0.005 % OP SOLN
1.0000 [drp] | Freq: Every day | OPHTHALMIC | Status: DC
  Filled 2021-06-26: qty 2.5

## 2021-06-26 MED ORDER — DIPHENHYDRAMINE HCL 25 MG PO CAPS: 25.0000 mg | ORAL_CAPSULE | Freq: Every day | ORAL | Status: DC | PRN

## 2021-06-26 MED ORDER — SODIUM CHLORIDE 0.9% FLUSH: 3.0000 mL | INTRAVENOUS | Status: DC | PRN

## 2021-06-26 MED ORDER — ESMOLOL HCL 100 MG/10ML IV SOLN
INTRAVENOUS | Status: AC
  Filled 2021-06-26: qty 10

## 2021-06-26 MED ORDER — FENTANYL CITRATE (PF) 250 MCG/5ML IJ SOLN
INTRAMUSCULAR | Status: AC
  Filled 2021-06-26: qty 5

## 2021-06-26 MED ORDER — ONDANSETRON HCL 4 MG/2ML IJ SOLN
INTRAMUSCULAR | Status: AC
  Filled 2021-06-26: qty 2

## 2021-06-26 MED ORDER — INSULIN REGULAR(HUMAN) IN NACL 100-0.9 UT/100ML-% IV SOLN
INTRAVENOUS | Status: AC
  Administered 2021-06-26: 7.5 [IU]/h via INTRAVENOUS
  Filled 2021-06-26: qty 100

## 2021-06-26 MED ORDER — ACETAMINOPHEN 650 MG RE SUPP: 650.0000 mg | Freq: Four times a day (QID) | RECTAL | Status: DC | PRN

## 2021-06-26 MED ORDER — MORPHINE SULFATE (PF) 2 MG/ML IV SOLN: 1.0000 mg | INTRAVENOUS | Status: DC | PRN

## 2021-06-26 MED ORDER — ONDANSETRON HCL 4 MG/2ML IJ SOLN
4.0000 mg | Freq: Four times a day (QID) | INTRAMUSCULAR | Status: DC | PRN
  Administered 2021-06-26 (×2): 4 mg via INTRAVENOUS
  Filled 2021-06-26 (×2): qty 2

## 2021-06-26 MED ORDER — PROPOFOL 10 MG/ML IV BOLUS
INTRAVENOUS | Status: DC | PRN
  Administered 2021-06-26: 110 mg via INTRAVENOUS

## 2021-06-26 MED ORDER — PROTAMINE SULFATE 10 MG/ML IV SOLN
INTRAVENOUS | Status: DC | PRN
  Administered 2021-06-26: 90 mg via INTRAVENOUS

## 2021-06-26 MED ORDER — PROPOFOL 1000 MG/100ML IV EMUL
INTRAVENOUS | Status: AC
  Filled 2021-06-26: qty 100

## 2021-06-26 MED ORDER — PANTOPRAZOLE SODIUM 40 MG PO TBEC
40.0000 mg | DELAYED_RELEASE_TABLET | Freq: Every day | ORAL | Status: DC
  Administered 2021-06-26 – 2021-06-27 (×2): 40 mg via ORAL
  Filled 2021-06-26 (×2): qty 1

## 2021-06-26 MED ORDER — EPHEDRINE SULFATE 50 MG/ML IJ SOLN
INTRAMUSCULAR | Status: DC | PRN
  Administered 2021-06-26: 5 mg via INTRAVENOUS
  Administered 2021-06-26: 2.5 mg via INTRAVENOUS
  Administered 2021-06-26: 5 mg via INTRAVENOUS

## 2021-06-26 MED ORDER — SODIUM CHLORIDE 0.9 % IV SOLN: 250.0000 mL | INTRAVENOUS | Status: DC | PRN

## 2021-06-26 MED ORDER — PRAVASTATIN SODIUM 40 MG PO TABS
40.0000 mg | ORAL_TABLET | Freq: Every evening | ORAL | Status: DC
  Administered 2021-06-26: 40 mg via ORAL
  Filled 2021-06-26: qty 1

## 2021-06-26 MED ORDER — CHLORHEXIDINE GLUCONATE 0.12 % MT SOLN
15.0000 mL | Freq: Once | OROMUCOSAL | Status: AC
  Administered 2021-06-26: 15 mL via OROMUCOSAL

## 2021-06-26 MED ORDER — HEPARIN SODIUM (PORCINE) 1000 UNIT/ML IJ SOLN
INTRAMUSCULAR | Status: DC | PRN
  Administered 2021-06-26: 9000 [IU] via INTRAVENOUS

## 2021-06-26 MED ORDER — CHLORHEXIDINE GLUCONATE 4 % EX LIQD
60.0000 mL | Freq: Once | CUTANEOUS | Status: DC
Start: 2021-06-26 — End: 2021-06-26

## 2021-06-26 MED ORDER — CHLORHEXIDINE GLUCONATE 4 % EX LIQD
60.0000 mL | Freq: Once | CUTANEOUS | Status: DC
Start: 2021-06-27 — End: 2021-06-26

## 2021-06-26 MED ORDER — ONDANSETRON HCL 4 MG/2ML IJ SOLN
INTRAMUSCULAR | Status: DC | PRN
  Administered 2021-06-26: 4 mg via INTRAVENOUS

## 2021-06-26 SURGICAL SUPPLY — 88 items
BAG DECANTER FOR FLEXI CONT (MISCELLANEOUS) IMPLANT
BAG SNAP BAND KOVER 36X36 (MISCELLANEOUS) ×4 IMPLANT
BLADE CLIPPER SURG (BLADE) IMPLANT
BLADE OSCILLATING /SAGITTAL (BLADE) IMPLANT
BLADE STERNUM SYSTEM 6 (BLADE) IMPLANT
CABLE ADAPT CONN TEMP 6FT (ADAPTER) ×4 IMPLANT
CANNULA FEM VENOUS REMOTE 22FR (CANNULA) IMPLANT
CANNULA OPTISITE PERFUSION 16F (CANNULA) IMPLANT
CANNULA OPTISITE PERFUSION 18F (CANNULA) IMPLANT
CATH DIAG EXPO 6F AL1 (CATHETERS) IMPLANT
CATH DIAG EXPO 6F VENT PIG 145 (CATHETERS) ×8 IMPLANT
CATH EXTERNAL FEMALE PUREWICK (CATHETERS) IMPLANT
CATH INFINITI 6F AL2 (CATHETERS) IMPLANT
CATH ROBINSON RED A/P 18FR (CATHETERS) ×4 IMPLANT
CATH S G BIP PACING (CATHETERS) ×4 IMPLANT
CLIP VESOCCLUDE MED 24/CT (CLIP) IMPLANT
CLIP VESOCCLUDE SM WIDE 24/CT (CLIP) IMPLANT
CNTNR URN SCR LID CUP LEK RST (MISCELLANEOUS) ×4 IMPLANT
CONT SPEC 4OZ STRL OR WHT (MISCELLANEOUS) ×4
COVER BACK TABLE 80X110 HD (DRAPES) ×8 IMPLANT
COVER DOME SNAP 22 D (MISCELLANEOUS) IMPLANT
COVER WAND RF STERILE (DRAPES) ×4 IMPLANT
DERMABOND ADVANCED (GAUZE/BANDAGES/DRESSINGS) ×4
DERMABOND ADVANCED .7 DNX12 (GAUZE/BANDAGES/DRESSINGS) ×4 IMPLANT
DRAPE INCISE IOBAN 66X45 STRL (DRAPES) IMPLANT
DRSG TEGADERM 4X4.75 (GAUZE/BANDAGES/DRESSINGS) ×8 IMPLANT
ELECT CAUTERY BLADE 6.4 (BLADE) IMPLANT
ELECT REM PT RETURN 9FT ADLT (ELECTROSURGICAL) ×8
ELECTRODE REM PT RTRN 9FT ADLT (ELECTROSURGICAL) ×4 IMPLANT
FELT TEFLON 6X6 (MISCELLANEOUS) IMPLANT
FEMORAL VENOUS CANN RAP (CANNULA) IMPLANT
GAUZE SPONGE 4X4 12PLY STRL (GAUZE/BANDAGES/DRESSINGS) ×4 IMPLANT
GLOVE EUDERMIC 7 POWDERFREE (GLOVE) IMPLANT
GLOVE SURG ENC MOIS LTX SZ7.5 (GLOVE) IMPLANT
GLOVE SURG ENC MOIS LTX SZ8 (GLOVE) IMPLANT
GLOVE SURG ORTHO LTX SZ7.5 (GLOVE) IMPLANT
GOWN STRL REUS W/ TWL LRG LVL3 (GOWN DISPOSABLE) IMPLANT
GOWN STRL REUS W/ TWL XL LVL3 (GOWN DISPOSABLE) ×2 IMPLANT
GOWN STRL REUS W/TWL LRG LVL3 (GOWN DISPOSABLE)
GOWN STRL REUS W/TWL XL LVL3 (GOWN DISPOSABLE) ×2
GUIDEWIRE SAFE TJ AMPLATZ EXST (WIRE) ×4 IMPLANT
INSERT FOGARTY SM (MISCELLANEOUS) IMPLANT
KIT BASIN OR (CUSTOM PROCEDURE TRAY) ×4 IMPLANT
KIT DILATOR VASC 18G NDL (KITS) IMPLANT
KIT HEART LEFT (KITS) ×4 IMPLANT
KIT SUCTION CATH 14FR (SUCTIONS) IMPLANT
KIT TURNOVER KIT B (KITS) ×4 IMPLANT
LOOP VESSEL MAXI BLUE (MISCELLANEOUS) IMPLANT
LOOP VESSEL MINI RED (MISCELLANEOUS) IMPLANT
NEEDLE 22X1 1/2 (OR ONLY) (NEEDLE) IMPLANT
NEEDLE PERC 18GX7CM (NEEDLE) ×4 IMPLANT
NS IRRIG 1000ML POUR BTL (IV SOLUTION) ×12 IMPLANT
PACK ENDOVASCULAR (PACKS) ×4 IMPLANT
PAD ARMBOARD 7.5X6 YLW CONV (MISCELLANEOUS) ×8 IMPLANT
PAD ELECT DEFIB RADIOL ZOLL (MISCELLANEOUS) ×4 IMPLANT
PENCIL BUTTON HOLSTER BLD 10FT (ELECTRODE) ×4 IMPLANT
POSITIONER HEAD DONUT 9IN (MISCELLANEOUS) ×4 IMPLANT
SHEATH BRITE TIP 7FR 35CM (SHEATH) ×4 IMPLANT
SHEATH PINNACLE 6F 10CM (SHEATH) ×4 IMPLANT
SHEATH PINNACLE 8F 10CM (SHEATH) ×4 IMPLANT
SLEEVE REPOSITIONING LENGTH 30 (MISCELLANEOUS) ×4 IMPLANT
SPONGE LAP 4X18 RFD (DISPOSABLE) IMPLANT
STOPCOCK MORSE 400PSI 3WAY (MISCELLANEOUS) ×8 IMPLANT
SUT ETHIBOND X763 2 0 SH 1 (SUTURE) IMPLANT
SUT GORETEX CV 4 TH 22 36 (SUTURE) IMPLANT
SUT GORETEX CV4 TH-18 (SUTURE) ×12 IMPLANT
SUT MNCRL AB 3-0 PS2 18 (SUTURE) IMPLANT
SUT PROLENE 5 0 C 1 36 (SUTURE) ×8 IMPLANT
SUT PROLENE 6 0 C 1 30 (SUTURE) IMPLANT
SUT SILK  1 MH (SUTURE) ×2
SUT SILK 1 MH (SUTURE) ×2 IMPLANT
SUT SILK 2 0 SH CR/8 (SUTURE) ×4 IMPLANT
SUT VIC AB 2-0 CT1 27 (SUTURE) ×2
SUT VIC AB 2-0 CT1 TAPERPNT 27 (SUTURE) ×2 IMPLANT
SUT VIC AB 2-0 CTX 36 (SUTURE) IMPLANT
SUT VIC AB 3-0 SH 27 (SUTURE) ×2
SUT VIC AB 3-0 SH 27X BRD (SUTURE) ×2 IMPLANT
SUT VIC AB 3-0 SH 8-18 (SUTURE) IMPLANT
SYR 50ML LL SCALE MARK (SYRINGE) ×4 IMPLANT
SYR BULB IRRIG 60ML STRL (SYRINGE) IMPLANT
SYR CONTROL 10ML LL (SYRINGE) IMPLANT
TOWEL GREEN STERILE (TOWEL DISPOSABLE) ×4 IMPLANT
TOWEL GREEN STERILE FF (TOWEL DISPOSABLE) ×4 IMPLANT
TRANSDUCER W/STOPCOCK (MISCELLANEOUS) ×8 IMPLANT
TRAY FOLEY SLVR 16FR TEMP STAT (SET/KITS/TRAYS/PACK) IMPLANT
VALVE HEART TRANSCATH SZ3 29MM (Valve) ×4 IMPLANT
WIRE EMERALD 3MM-J .035X150CM (WIRE) ×4 IMPLANT
WIRE EMERALD 3MM-J .035X260CM (WIRE) ×4 IMPLANT

## 2021-06-26 NOTE — Anesthesia Preprocedure Evaluation (Signed)
Anesthesia Evaluation  Patient identified by MRN, date of birth, ID band Patient awake    Reviewed: Allergy & Precautions, NPO status , Patient's Chart, lab work & pertinent test results  Airway Mallampati: II  TM Distance: >3 FB Neck ROM: Full    Dental  (+) Edentulous Upper, Edentulous Lower   Pulmonary Current Smoker,    breath sounds clear to auscultation + decreased breath sounds      Cardiovascular hypertension,  Rhythm:Regular Rate:Normal + Systolic murmurs    Neuro/Psych    GI/Hepatic   Endo/Other  diabetes  Renal/GU      Musculoskeletal   Abdominal   Peds  Hematology   Anesthesia Other Findings   Reproductive/Obstetrics                             Anesthesia Physical Anesthesia Plan  ASA: 3  Anesthesia Plan: General   Post-op Pain Management:    Induction: Intravenous  PONV Risk Score and Plan: Ondansetron and Dexamethasone  Airway Management Planned: Oral ETT  Additional Equipment: Arterial line  Intra-op Plan:   Post-operative Plan: Extubation in OR  Informed Consent: I have reviewed the patients History and Physical, chart, labs and discussed the procedure including the risks, benefits and alternatives for the proposed anesthesia with the patient or authorized representative who has indicated his/her understanding and acceptance.       Plan Discussed with: CRNA and Anesthesiologist  Anesthesia Plan Comments:         Anesthesia Quick Evaluation

## 2021-06-26 NOTE — Discharge Instructions (Addendum)
Nexium was changed to Protonix given potential drug drug interaction with Plavix. This can be resumed after 6 months when you finish your course of plavix.  ACTIVITY AND EXERCISE  Daily activity and exercise are an important part of your recovery. People recover at different rates depending on their general health and type of valve procedure.  Most people recovering from TAVR feel better relatively quickly   No lifting, pushing, pulling more than 10 pounds (examples to avoid: groceries, vacuuming, gardening, golfing):             - For one week with a procedure through the groin.             - For six weeks for procedures through the chest wall or neck. NOTE: You will typically see one of our providers 7-14 days after your procedure to discuss [place] the above activities.      DRIVING  Do not drive until you are seen for follow up and cleared by a provider. Generally, we ask patient to not drive for 1 week after their procedure.  If you have been told by your doctor in the past that you may not drive, you must talk with him/her before you begin driving again.   DRESSING  Groin site: you may leave the clear dressing over the site for up to one week or until it falls off.   HYGIENE  If you had a femoral (leg) procedure, you may take a shower when you return home. After the shower, pat the site dry. Do NOT use powder, oils or lotions in your groin area until the site has completely healed.  If you had a chest procedure, you may shower when you return home unless specifically instructed not to by your discharging practitioner.             - DO NOT scrub incision; pat dry with a towel.             - DO NOT apply any lotions, oils, powders to the incision.             - No tub baths / swimming for at least 2 weeks.  If you notice any fevers, chills, increased pain, swelling, bleeding or pus, please contact your doctor.   ADDITIONAL INFORMATION  If you are going to have an upcoming dental  procedure, please contact our office as you will require antibiotics ahead of time to prevent infection on your heart valve.    If you have any questions or concerns you can call the structural heart phone during normal business hours 8am-4pm. If you have an urgent need after hours or weekends please call [phone] to talk to the on call provider for general cardiology. If you have an emergency that requires immediate attention, please call 911.    After TAVR Checklist  Check  Test Description   Follow up appointment in 1-2 weeks  You will see our structural heart physician assistant, Nell Range. Your incision sites will be checked and you will be cleared to drive and resume all normal activities if you are doing well.     1 month echo and follow up  You will have an echo to check on your new heart valve and be seen back in the office by Nell Range. Many times the echo is not read by your appointment time, but Joellen Jersey will call you later that day or the following day to report your results.   Follow up  with your primary cardiologist You will need to be seen by your primary cardiologist in the following 3-6 months after your 1 month appointment in the valve clinic. Often times your Plavix or Aspirin will be discontinued during this time, but this is decided on a case by case basis.    1 year echo and follow up You will have another echo to check on your heart valve after 1 year and be seen back in the office by Nell Range. This your last structural heart visit.   Bacterial endocarditis prophylaxis  You will have to take antibiotics for the rest of your life before all dental procedures (even teeth cleanings) to protect your heart valve. Antibiotics are also required before some surgeries. Please check with your cardiologist before scheduling any surgeries. Also, please make sure to tell us if you have a penicillin allergy as you will require an alternative antibiotic.

## 2021-06-26 NOTE — Anesthesia Procedure Notes (Signed)
Arterial Line Insertion Start/End6/28/2022 7:10 AM, 06/26/2021 7:20 AM Performed by: Roberts Gaudy, MD  Preanesthetic checklist: patient identified, IV checked, site marked, risks and benefits discussed, surgical consent and monitors and equipment checked Right, brachial was placed Catheter size: 20 G Hand hygiene performed  and maximum sterile barriers used   Attempts: 1 Procedure performed without using ultrasound guided technique. Ultrasound Notes:anatomy identified, needle tip was noted to be adjacent to the nerve/plexus identified and no ultrasound evidence of intravascular and/or intraneural injection Following insertion, Biopatch and dressing applied. Patient tolerated the procedure well with no immediate complications.

## 2021-06-26 NOTE — Progress Notes (Signed)
  Echocardiogram Echocardiogram Transesophageal has been performed.  Bobbye Charleston 06/26/2021, 9:37 AM

## 2021-06-26 NOTE — Progress Notes (Signed)
  [place] VALVE TEAM  Patient doing well s/p TAVR. He is hemodynamically stable. Groin/subclavian sites stable. ECG with sinus and no high grade block. Arterial line discontinued and transferred to 4E. Plan for early ambulation after bedrest completed and hopeful discharge over the next 24-48 hours.   Angelena Form PA-C  [facility]  Pager [phone]  .

## 2021-06-26 NOTE — Progress Notes (Signed)
Mobility Specialist: Progress Note   06/26/21 1822  Mobility  Activity Ambulated in hall  Level of Assistance Contact guard assist, steadying assist  Assistive Device  (IV Pole)  Distance Ambulated (ft) 100 ft  Mobility Ambulated with assistance in hallway  Mobility Response Tolerated well  Mobility performed by Mobility specialist  $Mobility charge 1 Mobility   Pre-Mobility: 85 HR, 94% SpO2 Post-Mobility: 90 HR  Pt c/o feeling dizzy during ambulation but otherwise asx. Pt sitting EOB and is set-up with his dinner per request.   Harrell Gave Alexias Margerum Mobility Specialist Mobility Specialist Phone: [phone]

## 2021-06-26 NOTE — Progress Notes (Signed)
Patient had episode of emesis. Patient given zofran. Will continue to monitor     Patient has not eaten much. Patient states that, "the food is awful". CBG 75. Patient given graham crackers; recheck was 86. Patient did not want any snacks.

## 2021-06-26 NOTE — CV Procedure (Signed)
[facility]  TAVR OPERATIVE NOTE   Date of Procedure:  06/26/2021  Preoperative Diagnosis: Severe Aortic Stenosis   Postoperative Diagnosis: Same   Procedure:   Transcatheter Aortic Valve Replacement - Trans Subclavian femoral Approach  Edwards Sapien 3 THV (size 29 mm, model # [id number],  serial # [id number])   Co-Surgeons:  [name], MD and Gaye Pollack, MD   Anesthesiologist:  Linna Caprice  Echocardiographer:  Croitoru  Pre-operative Echo Findings: Severe aortic stenosis Normal left ventricular systolic function  Post-operative Echo Findings: No paravalvular leak Normal left ventricular systolic function  BRIEF CLINICAL NOTE AND INDICATIONS FOR SURGERY  75 yo male with history of carotid artery disease, tobacco abuse, COPD, HTN, GERD, diabetes mellitus and severe aortic stenosis who is here today for TAVR.  He has history of necrotizing pneumonia 2 years ago, ongoing tobacco abuse and COPD. He smokes 2 ppd and has done this for 55 years. Recently seen by Dr. Domenic Polite after a murmur was heard in primary care. He has ongoing dyspnea, fatigue and LE edema. Echo 03/15/21 with LVEF=60-65%, mild LVH. The aortic valve leaflets are thickened and calcified. Likely bicuspid valve. Mean gradient 22 mmHg, peak gradient 36.5 mmHg, AVA 0.72 cm2, dimensionless index 0.23, SVI 24. This is consistent with severe aortic stenosis.Scans with severe iliac artery disease.   During the course of the patient's preoperative work up they have been evaluated comprehensively by a multidisciplinary team of specialists coordinated through the [facility] in the [facility].  They have been demonstrated to suffer from symptomatic severe aortic stenosis as noted above. The patient has been counseled extensively as to the relative risks and benefits of all options for the treatment of severe aortic stenosis including long term medical therapy,  conventional surgery for aortic valve replacement, and transcatheter aortic valve replacement.  The patient has been independently evaluated by Dr. Cyndia Bent with CT surgery and they are felt to be at high risk for conventional surgical aortic valve replacement. The surgeon indicated the patient would be a poor candidate for conventional surgery. Based upon review of all of the patient's preoperative diagnostic tests they are felt to be candidate for transcatheter aortic valve replacement using the transfemoral approach as an alternative to high risk conventional surgery.    Following the decision to proceed with transcatheter aortic valve replacement, a discussion has been held regarding what types of management strategies would be attempted intraoperatively in the event of life-threatening complications, including whether or not the patient would be considered a candidate for the use of cardiopulmonary bypass and/or conversion to open sternotomy for attempted surgical intervention.  The patient has been advised of a variety of complications that might develop peculiar to this approach including but not limited to risks of death, stroke, paravalvular leak, aortic dissection or other major vascular complications, aortic annulus rupture, device embolization, cardiac rupture or perforation, acute myocardial infarction, arrhythmia, heart block or bradycardia requiring permanent pacemaker placement, congestive heart failure, respiratory failure, renal failure, pneumonia, infection, other late complications related to structural valve deterioration or migration, or other complications that might ultimately cause a temporary or permanent loss of functional independence or other long term morbidity.  The patient provides full informed consent for the procedure as described and all questions were answered preoperatively.    DETAILS OF THE OPERATIVE PROCEDURE  PREPARATION:   The patient is brought to the operating room  on the above mentioned date and central monitoring was established by  the anesthesia team including placement of a radial arterial line. The patient is placed in the supine position on the operating table.  Intravenous antibiotics are administered. General sedation is used.   Baseline transesophageal echocardiogram was performed. The patient's chest, abdomen, both groins, and both lower extremities are prepared and draped in a sterile manner. A time out procedure is performed.  PERIPHERAL ACCESS:   Using the modified Seldinger technique, femoral arterial and venous access were obtained with placement of a 6 Fr sheath in the artery and a 7 Fr sheath in the vein on the right side using u/s guidance.  A pigtail diagnostic catheter was passed through the femoral arterial sheath under fluoroscopic guidance into the aortic root.  A temporary transvenous pacemaker catheter was passed through the femoral venous sheath under fluoroscopic guidance into the right ventricle.  The pacemaker was tested to ensure stable lead placement and pacemaker capture. Aortic root angiography was performed in order to determine the optimal angiographic angle for valve deployment.  TRANSSUBCLAVIAN ARTERY ACCESS:  Please see Dr. Earlene Plater operative note for details of subclavian artery exposure. The patient was heparinized systemically and ACT verified > 250 seconds. Needle used to acces the left subclavian artery. J wire advanced into the ascending aorta. 8 French sheath advanced into the vessel. I crossed into the LV with the J wire through an AL-1 catheter. I then changed this out for a pigtail catheter. The pigtail catheter was then exchanged for an Amplatz Extra-stiff wire in the LV apex.  A 16 Fr transfemoral E-sheath was introduced into the subclavian artery.    TRANSCATHETER HEART VALVE DEPLOYMENT:  An Edwards Sapien 3 THV (size 29 mm) was prepared and crimped per manufacturer's guidelines, and the proper orientation of the  valve is confirmed on the [employer] delivery system. The valve was advanced through the introducer sheath using normal technique until in an appropriate position in the ascending aorta beyond the sheath tip. The balloon was then retracted and using the fine-tuning wheel was centered on the valve. The valve was then advanced across the aortic arch using appropriate flexion of the catheter. The valve was carefully positioned across the aortic valve annulus. The Commander catheter was retracted using normal technique. Once final position of the valve has been confirmed by angiographic assessment, the valve is deployed while temporarily holding ventilation and during rapid ventricular pacing to maintain systolic blood pressure < 50 mmHg and pulse pressure < 10 mmHg. The balloon inflation is held for >3 seconds after reaching full deployment volume. Once the balloon has fully deflated the balloon is retracted into the ascending aorta and valve function is assessed using TEE. There is felt to be no paravalvular leak and no central aortic insufficiency.  The patient's hemodynamic recovery following valve deployment is good.  The deployment balloon and guidewire are both removed. Echo demostrated acceptable post-procedural gradients, stable mitral valve function, and no AI.   PROCEDURE COMPLETION:  The sheath was then removed and the wound was closed per Dr. Cyndia Bent. Please see his operative note for details of the procedure. Protamine was administered once subclavian artery repair was complete. The temporary pacemaker was removed. Manual pressure used for arterial and venous hemostasis.    The patient tolerated the procedure well and is transported to the surgical intensive care in stable condition. There were no immediate intraoperative complications. All sponge instrument and needle counts are verified correct at completion of the operation.   No blood products were administered during the operation.  The  patient received a total of 60 mL of intravenous contrast during the procedure.  [name] MD 06/26/2021 11:22 AM

## 2021-06-26 NOTE — Transfer of Care (Signed)
Immediate Anesthesia Transfer of Care Note  Patient: Patrick Beck  Procedure(s) Performed: TRANSCATHETER AORTIC VALVE REPLACEMENT, LEFT SUBCLAVIAN (Left: Chest) TRANSESOPHAGEAL ECHOCARDIOGRAM (TEE) (Chest)  Patient Location: Cath Lab  Anesthesia Type:General  Level of Consciousness: awake and drowsy  Airway & Oxygen Therapy: Patient Spontanous Breathing and Patient connected to nasal cannula oxygen  Post-op Assessment: Report given to RN, Post -op Vital signs reviewed and stable and Patient moving all extremities X 4  Post vital signs: Reviewed and stable  Last Vitals:  Vitals Value Taken Time  BP 171/97 06/26/21 1016  Temp    Pulse 71 06/26/21 1018  Resp 20 06/26/21 1018  SpO2 98 % 06/26/21 1018  Vitals shown include unvalidated device data.  Last Pain:  Vitals:   06/26/21 0606  TempSrc:   PainSc: 0-No pain         Complications: No notable events documented.

## 2021-06-26 NOTE — Anesthesia Procedure Notes (Signed)
Procedure Name: Intubation Date/Time: 06/26/2021 8:00 AM Performed by: Inda Coke, CRNA Pre-anesthesia Checklist: Patient identified, Emergency Drugs available, Suction available and Patient being monitored Patient Re-evaluated:Patient Re-evaluated prior to induction Oxygen Delivery Method: Circle System Utilized Preoxygenation: Pre-oxygenation with 100% oxygen Induction Type: IV induction Ventilation: Mask ventilation without difficulty and Oral airway inserted - appropriate to patient size Laryngoscope Size: Mac and 4 Grade View: Grade I Tube type: Oral Tube size: 7.5 mm Number of attempts: 1 Airway Equipment and Method: Stylet and Oral airway Placement Confirmation: ETT inserted through vocal cords under direct vision, positive ETCO2 and breath sounds checked- equal and bilateral Secured at: 22 cm Tube secured with: Tape Dental Injury: Teeth and Oropharynx as per pre-operative assessment

## 2021-06-26 NOTE — Anesthesia Postprocedure Evaluation (Signed)
Anesthesia Post Note  Patient: [name]  Procedure(s) Performed: TRANSCATHETER AORTIC VALVE REPLACEMENT, LEFT SUBCLAVIAN (Left: Chest) TRANSESOPHAGEAL ECHOCARDIOGRAM (TEE) (Chest)     Patient location during evaluation: Cath Lab Anesthesia Type: General Level of consciousness: awake and alert Pain management: pain level controlled Vital Signs Assessment: post-procedure vital signs reviewed and stable Respiratory status: spontaneous breathing, nonlabored ventilation, respiratory function stable and patient connected to nasal cannula oxygen Cardiovascular status: blood pressure returned to baseline and stable Postop Assessment: no apparent nausea or vomiting Anesthetic complications: no   No notable events documented.  Last Vitals:  Vitals:   06/26/21 1100 06/26/21 1145  BP: (!) 162/74 126/78  Pulse: 72 71  Resp: 20 19  Temp:    SpO2: 99% 90%    Last Pain:  Vitals:   06/26/21 0606  TempSrc:   PainSc: 0-No pain                 Williemae Muriel COKER

## 2021-06-26 NOTE — Op Note (Signed)
[facility]   MULTIDISCIPLINARY HEART VALVE TEAM   TAVR OPERATIVE NOTE   Date of Procedure:  06/26/2021  Preoperative Diagnosis: Severe Aortic Stenosis   Postoperative Diagnosis: Same   Procedure:   Transcatheter Aortic Valve Replacement - Left Subclavian Approach  Edwards Sapien 3 THV (size 29 mm, model # 9600TFX, serial # [id number])   Co-Surgeons:  Gaye Pollack, MD and [name], MD  Anesthesiologist:  Orland Jarred, MD  Echocardiographer:  Jerilynn Mages. 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 He describes gradual worsening of symptoms of exertional shortness of breath and fatigue consistent with chronic diastolic congestive heart failure, [place] Heart Association functional class IIb bordering on class III. Some of his symptoms of shortness of breath may also be related to his underlying chronic lung disease with longstanding and ongoing tobacco abuse.   I have personally reviewed the patient's recent transthoracic echocardiogram, EKG, diagnostic cardiac catheterization, and CT angiograms. Echocardiogram is notable for marginal acoustic windows but findings consistent with severe aortic stenosis. The patient appears to have Sievers type I bicuspid aortic valve with severe calcification, thickening, and restricted leaflet mobility involving all 3 of the leaflets. One of them appears essentially immobile. Peak velocity across aortic valve measured just over 3 m/s corresponding to mean transvalvular gradient estimated 22 mmHg but aortic valve area calculated only 0.72 cm. The DVI was notably quite low at 0.23 and stroke-volume index only 24. Left ventricular  ejection fraction was reported 60 to 65% although I believe that number is generous and systolic function is probably mildly reduced. Diagnostic cardiac catheterization confirmed the presence of aortic stenosis and revealed only mild nonobstructive coronary artery disease. Right heart pressures were normal. 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 Options include conventional surgical aortic valve replacement with or without plication or graft replacement of the proximal aorta versus transcatheter aortic valve replacement. However, the patient has underlying severe COPD and I am very concerned that he would not do well with conventional surgery. CTA of the aorta and iliac vessels demonstrates both severe tortuosity and atherosclerotic disease. We will plan left subclavian access.   During the course of the patient's preoperative work up they have been evaluated comprehensively by a multidisciplinary team of specialists coordinated through the [facility] in the [facility].  They have been demonstrated to suffer from symptomatic severe aortic stenosis as noted above. The patient has been counseled extensively as to the relative risks and benefits of all options for the treatment of severe aortic stenosis including long term medical therapy, conventional surgery for aortic valve replacement, and transcatheter aortic valve replacement.  All questions have been answered, and the patient provides full informed consent for the operation as described.   DETAILS OF THE OPERATIVE PROCEDURE  PREPARATION:    The patient was brought to the operating room on the above mentioned date and  appropriate monitoring was established by the anesthesia team. The patient was placed in the supine position on the operating table.  Intravenous antibiotics were administered. General endotracheal anesthesia was induced uneventfully.   Baseline transesophageal echocardiogram was performed. The patient's abdomen and both groins were prepped and draped in a sterile manner. A time out procedure was performed.   PERIPHERAL ACCESS:    Using the modified Seldinger technique, femoral arterial and venous access was obtained with placement of 6 Fr sheaths on the right side.  A pigtail diagnostic catheter was passed through the right arterial sheath under fluoroscopic guidance into the aortic root.  A temporary transvenous pacemaker catheter was passed through the right femoral venous sheath under fluoroscopic guidance into the right ventricle.  The pacemaker was tested to ensure stable lead placement and pacemaker capture. Aortic root angiography was performed in order to determine the optimal angiographic angle for valve deployment.  LEFT SUBCLAVIAN ACCESS:   A transverse incision was made below the left clavicle and carried down through the subcutaneous tissue using electrocautery. The pectoralis major muscle was split along its fibers and the pectoralis minor muscle retracted laterally. The left axillary artery was identified and encircled with a vessel loop. The patient was heparinized systemically and ACT verified > 250 seconds.  A double concentric purse string suture of CV-4 gortex was placed in the anterior wall of the artery. The artery was cannulated with a needle and a J- wire advanced into the ascending aorta. An 8 F sheath was inserted over the wire. The aortic valve was crossed with a JR4 catheter and a straight wire. This was exchanged for a pigtail catheter and position was confirmed in the LV apex. Simultaneous LV and Ao pressures were recorded.  The pigtail catheter was exchanged for an Amplatz  Extra-stiff wire in the LV apex.  Then a 105F E-sheath was inserted into the axillary artery and the tip advanced into the aortic arch.  BALLOON AORTIC VALVULOPLASTY:   Not performed.  TRANSCATHETER HEART VALVE DEPLOYMENT:   An Edwards Sapien 3 Ultra transcatheter heart valve (size 29 mm) was prepared and crimped per manufacturer's guidelines, and the proper orientation of the valve is confirmed on the [employer] delivery system. The valve was advanced through the introducer sheath using normal technique until in an appropriate position in the ascending aorta beyond the sheath tip. The balloon was then retracted and using the fine-tuning wheel was centered on the valve. The valve was then advanced across the aortic arch using appropriate flexion of the catheter. The valve was carefully positioned across the aortic valve annulus. The Commander catheter was retracted using normal technique. Once final position of the valve has been confirmed by angiographic assessment, the valve is deployed while temporarily holding ventilation and during rapid ventricular pacing to maintain systolic blood pressure < 50 mmHg and pulse pressure < 10 mmHg. The balloon inflation is held for >3 seconds after reaching full deployment volume. Once the balloon has fully deflated the balloon is retracted into the ascending aorta and valve function is assessed using echocardiography. There is felt to be trivial paravalvular leak and no central aortic insufficiency. Post-procedural gradients were acceptable. The patient's hemodynamic recovery following valve deployment is good.  The deployment balloon and guidewire are both removed.   PROCEDURE COMPLETION:   The sheath was removed and axillary artery closure performed.  Protamine was administered once axillary arterial repair was complete. The temporary pacemaker, pigtail catheters and femoral sheaths were removed with manual pressure used for hemostasis.    The patient  tolerated the procedure well and is transported to the cath lab recovery area  in stable condition. There were no immediate intraoperative complications. All sponge instrument and needle counts are verified correct at completion of the operation.   No blood products were administered during the operation.  The patient received a total of 60 mL of intravenous contrast during the procedure.   Gaye Pollack, MD 06/26/2021

## 2021-06-26 NOTE — Interval H&P Note (Signed)
History and Physical Interval Note:  06/26/2021 6:43 AM  Patrick Beck  has presented today for surgery, with the diagnosis of Severe Aortic Stenosis.  The various methods of treatment have been discussed with the patient and family. After consideration of risks, benefits and other options for treatment, the patient has consented to  Procedure(s): TRANSCATHETER AORTIC VALVE REPLACEMENT, LEFT SUBCLAVIAN (Left) TRANSESOPHAGEAL ECHOCARDIOGRAM (TEE) (N/A) as a surgical intervention.  The patient's history has been reviewed, patient examined, no change in status, stable for surgery.  I have reviewed the patient's chart and labs.  Questions were answered to the patient's satisfaction.     Gaye Pollack

## 2021-06-27 ENCOUNTER — Other Ambulatory Visit (HOSPITAL_COMMUNITY): Payer: Self-pay

## 2021-06-27 ENCOUNTER — Inpatient Hospital Stay (HOSPITAL_COMMUNITY): Payer: Medicare Other

## 2021-06-27 ENCOUNTER — Encounter (HOSPITAL_COMMUNITY): Payer: Self-pay | Admitting: Cardiovascular Disease

## 2021-06-27 DIAGNOSIS — I350 Nonrheumatic aortic (valve) stenosis: Secondary | ICD-10-CM

## 2021-06-27 DIAGNOSIS — Z954 Presence of other heart-valve replacement: Secondary | ICD-10-CM

## 2021-06-27 DIAGNOSIS — Z952 Presence of prosthetic heart valve: Secondary | ICD-10-CM

## 2021-06-27 LAB — BASIC METABOLIC PANEL
Anion gap: 7 (ref 5–15)
BUN: 12 mg/dL (ref 8–23)
CO2: 28 mmol/L (ref 22–32)
Calcium: 8.9 mg/dL (ref 8.9–10.3)
Chloride: 99 mmol/L (ref 98–111)
Creatinine, Ser: 0.7 mg/dL (ref 0.61–1.24)
GFR, Estimated: >60 mL/min (ref >60)
Glucose, Bld: 98 mg/dL (ref 70–99)
Potassium: 5.5 mmol/L — ABNORMAL HIGH (ref 3.5–5.1)
Sodium: 134 mmol/L — ABNORMAL LOW (ref 135–145)

## 2021-06-27 LAB — CBC
HCT: 39.2 % (ref 39.0–52.0)
Hemoglobin: 12.6 g/dL — ABNORMAL LOW (ref 13.0–17.0)
MCH: 31.8 pg (ref 26.0–34.0)
MCHC: 32.1 g/dL (ref 30.0–36.0)
MCV: 99 fL (ref 80.0–100.0)
Platelets: UNDETERMINED 10*3/uL (ref 150–400)
RBC: 3.96 MIL/uL — ABNORMAL LOW (ref 4.22–5.81)
RDW: 13.1 % (ref 11.5–15.5)
WBC: 10.2 10*3/uL (ref 4.0–10.5)
nRBC: 0 % (ref 0.0–0.2)

## 2021-06-27 LAB — ECHOCARDIOGRAM COMPLETE
AR max vel: 2.91 cm2
AV Area VTI: 2.52 cm2
AV Area mean vel: 2.75 cm2
AV Mean grad: 5.5 mmHg
AV Peak grad: 8.6 mmHg
Ao pk vel: 1.46 m/s
Area-P 1/2: 4.36 cm2
Height: 73 in
S' Lateral: 3.3 cm
Weight: 2130.53 oz

## 2021-06-27 LAB — GLUCOSE, CAPILLARY
Glucose-Capillary: 140 mg/dL — ABNORMAL HIGH (ref 70–99)
Glucose-Capillary: 169 mg/dL — ABNORMAL HIGH (ref 70–99)
Glucose-Capillary: 46 mg/dL — ABNORMAL LOW (ref 70–99)
Glucose-Capillary: 94 mg/dL (ref 70–99)

## 2021-06-27 LAB — MAGNESIUM: Magnesium: 1.5 mg/dL — ABNORMAL LOW (ref 1.7–2.4)

## 2021-06-27 MED ORDER — CLOPIDOGREL BISULFATE 75 MG PO TABS
75.0000 mg | ORAL_TABLET | Freq: Every day | ORAL | 1 refills | Status: DC
  Filled 2021-06-27: qty 90, 90d supply, fill #0

## 2021-06-27 MED ORDER — FUROSEMIDE 40 MG PO TABS: 20.0000 mg | ORAL_TABLET | ORAL | Status: DC | PRN

## 2021-06-27 MED ORDER — ASPIRIN 81 MG PO CHEW
81.0000 mg | CHEWABLE_TABLET | Freq: Every day | ORAL | 3 refills | Status: AC
  Filled 2021-06-27: qty 90, 90d supply, fill #0

## 2021-06-27 MED ORDER — PANTOPRAZOLE SODIUM 40 MG PO TBEC
40.0000 mg | DELAYED_RELEASE_TABLET | Freq: Every day | ORAL | 1 refills | Status: DC
  Filled 2021-06-27: qty 90, 90d supply, fill #0

## 2021-06-27 MED FILL — Potassium Chloride Inj 2 mEq/ML: INTRAVENOUS | Qty: 40 | Status: AC

## 2021-06-27 MED FILL — Magnesium Sulfate Inj 50%: INTRAMUSCULAR | Qty: 10 | Status: AC

## 2021-06-27 MED FILL — Heparin Sodium (Porcine) Inj 1000 Unit/ML: INTRAMUSCULAR | Qty: 30 | Status: AC

## 2021-06-27 NOTE — Progress Notes (Addendum)
Patient placed on 3 L O2 to maintain sats >92%

## 2021-06-27 NOTE — Progress Notes (Signed)
CARDIAC REHAB PHASE I   PRE:  Rate/Rhythm: 83 SR    BP: sitting 133/76    SaO2: 95 1:  MODE:  Ambulation: 190 ft   POST:  Rate/Rhythm: 95 SR    BP: sitting 142/92     SaO2: 88 RA, 90 RA with rest  Fairly steady in hall but fatigues quickly (probably baseline). Sts he doesn't feel a difference in breathing. SaO2 low after walk, sts he has never had O2 at home but he has a pulse ox (sts normally 95 RA). Left on RA in room. Discussed restrictions, walking as tolerated and CRPII. Gave pt smoking cessation sheet and DM diet. He is not interested in quitting smoking. He has been working on his DM diet. Will refer to [facility] CRPII as he is interested. [place], ACSM 06/27/2021 8:53 AM

## 2021-06-27 NOTE — Progress Notes (Signed)
  Echocardiogram 2D Echocardiogram has been performed.  Patrick Beck 06/27/2021, 9:24 AM

## 2021-06-27 NOTE — Progress Notes (Signed)
Pt stated doesn't feel well. CBG checked pt CBG 46. Pt given Orange juice. Pt states he's feeling better. Will recheck sugar and monitor pt   Phoebe Sharps, RN

## 2021-06-27 NOTE — Progress Notes (Signed)
Pt discharged from unit. Medication/discharge instruction given  Irasema Chalk K Raissa Dam, RN  

## 2021-06-27 NOTE — Discharge Summary (Signed)
[facility]VE TEAM  Discharge Summary    Patient ID: Patrick Beck MRN: [id number]; DOB: 1946/03/19  Admit date: 06/26/2021 Discharge date: 06/27/2021  Primary Care Provider: Lemmie Evens, MD  Primary Cardiologist: Rozann Lesches, MD / Dr. Angelena Form & Dr. Cyndia Bent (TAVR)  Discharge Diagnoses    Principal Problem:   S/P TAVR (transcatheter aortic valve replacement) Active Problems:   COPD (chronic obstructive pulmonary disease) (HCC)   Tobacco dependence   Type 2 diabetes mellitus without complication (HCC)   Essential hypertension   Severe aortic stenosis   PAD (peripheral artery disease) (HCC)   Allergies Allergies  Allergen Reactions   Macadamia Nut Oil Anaphylaxis    Throat swelling and can't breathe    Diagnostic Studies/Procedures    TAVR OPERATIVE NOTE     Date of Procedure:                06/26/2021   Preoperative Diagnosis:      Severe Aortic Stenosis   Postoperative Diagnosis:    Same   Procedure:        Transcatheter Aortic Valve Replacement - Left Subclavian Approach             Edwards Sapien 3 THV (size 29 mm, model # 9600TFX, serial # [id number])              Co-Surgeons:                        Gaye Pollack, MD and [name], MD   Anesthesiologist:                  Orland Jarred, MD   Echocardiographer:              Bertrum Sol, MD   Pre-operative Echo Findings: Severe aortic stenosis Normal left ventricular systolic function   Post-operative Echo Findings: No paravalvular leak Normal left ventricular systolic function   _____________   Echo 06/26/21: completed but pending formal read at the time of discharge    History of Present Illness     Patrick Beck is a 75 y.o. male with a history of carotid artery disease, tobacco abuse, COPD, HTN, GERD, DMT2, LE edema and severe aortic stenosis who presented to [facility] on 06/26/21 for planned TAVR.   Patient states that he has known of presence of a  heart murmur for many years.  He states that he was in his usual state of health without any physical limitations until fall 2019 when he was hospitalized in [place] for severe necrotizing pneumonia.  He states that he never completely recovered.  Echocardiogram performed at that time reportedly revealed mild aortic stenosis with normal left ventricular systolic function.  The patient was seen in follow-up recently by Dr. Domenic Polite and complaining of worsening symptoms of exertional shortness of breath.  Follow-up echocardiogram performed March 15, 2021 revealed normal left ventricular function with ejection fraction estimated 60 to 65%.  The aortic valve appeared functionally bicuspid with severe calcification and probably severe aortic stenosis.  Peak velocity across aortic valve measured 3.0 m/s but the dimensionless index was notably quite low at 0.23 and aortic valve area calculated 0.72 cm by VTI.  The patient was referred to the multidisciplinary heart valve clinic and has been evaluated previously by Dr. Angelena Form. Diagnostic cardiac catheterization performed April 18, 2021 revealed mild nonobstructive coronary artery disease normal right-sided pressures and preserved cardiac output.  The patient has been evaluated  by the multidisciplinary valve team and felt to have severe, symptomatic aortic stenosis and to be a suitable candidate for TAVR, which was set up for 06/26/21.   Hospital Course     Consultants: none   Severe AS: s/p successful TAVR with a 29 mm Edwards Sapien 3 Ultra THV via the left subclavian approach on 06/26/21. Post operative echo completed but pending formal read. Groin/subclavian sites are stable. ECG with sinus and no high grade heart block. Aspirin 325mg  discontinued and started on Asprin 81 mg daily and Plavix 75mg  daily. Plan for discharge home today with close follow up in the office next week.   HTN: resumed on home HCTZ. Also was on Lasix 40mg  for LE edema which was  attributed to Norvasc and AS. Will change this to PRN.   DMT2: treated with SSI while admitted. Resume home meds at discharge. 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   Abnormal CT of stomach: pre TAVR CT showed possible diffuse mural thickening throughout the stomach. Clinical correlation for gastritis is recommended. The possibility of infiltrative gastric neoplasm is not entirely excluded. Consideration for further evaluation with nonemergent endoscopy is recommended if clinically appropriate. Will refer him to GI as an outpatient.   COPD: stable without s/s AECOPD.   Hyperkalemia: k 5.5 today but hemolysis noted. Labs previously normal although does have a history of hyperkalemia with ACEi in past, which was discontinued. Plan BMET next week as I am stopping regularly scheduled lasix and making PRN. _____________  Discharge Vitals Blood pressure 133/76, pulse 82, temperature 97.9 F (36.6 C), temperature source Oral, resp. rate 19, height 6\' 1"  (1.854 m), weight 60.4 kg, SpO2 93 %.  Filed Weights   06/26/21 0553 06/27/21 0448  Weight: 62.6 kg 60.4 kg    Labs & Radiologic Studies    CBC Recent Labs    06/26/21 1041 06/27/21 0121  WBC  --  10.2  HGB 12.2* 12.6*  HCT 36.0* 39.2  MCV  --  99.0  PLT  --  PLATELET CLUMPS NOTED ON SMEAR, UNABLE TO ESTIMATE   Basic Metabolic Panel Recent Labs    06/26/21 1041 06/27/21 0121  NA 137 134*  K 4.2 5.5*  CL 100 99  CO2  --  28  GLUCOSE 65* 98  BUN 11 12  CREATININE 0.60* 0.70  CALCIUM  --  8.9  MG  --  1.5*   Liver Function Tests No results for input(s):  AST, ALT, ALKPHOS, BILITOT, PROT, ALBUMIN in the last 72 hours. No results for input(s): LIPASE, AMYLASE in the last 72 hours. Cardiac Enzymes No results for input(s): CKTOTAL, CKMB, CKMBINDEX, TROPONINI in the last 72 hours. BNP Invalid input(s): POCBNP D-Dimer No results for input(s): DDIMER in the last 72 hours. Hemoglobin A1C No results for input(s): HGBA1C in the last 72 hours. Fasting Lipid Panel No results for input(s): CHOL, HDL, LDLCALC, TRIG, CHOLHDL, LDLDIRECT in the last 72 hours. Thyroid Function Tests No results for input(s): TSH, T4TOTAL, T3FREE, THYROIDAB in the last 72 hours.  Invalid input(s): FREET3 _____________  DG Chest 2 View  Result Date: 06/15/2021 CLINICAL DATA:  Pre-admission  for TAVR EXAM: CHEST - 2 VIEW COMPARISON:  03/16/2021 FINDINGS: Lungs are hyperinflated as can be seen with COPD. No focal consolidation. No pleural effusion or pneumothorax. Heart and mediastinal contours are unremarkable. No acute osseous abnormality. IMPRESSION: No active cardiopulmonary disease. Electronically Signed   By: Kathreen Devoid   On: 06/15/2021 15:05   ECHO TEE  Result Date: 06/26/2021    TRANSESOPHOGEAL ECHO REPORT   Patient Name:   MIKIAS LANZ Date of Exam: 06/26/2021 Medical Rec #:  [id number]  Height:       73.0 in Accession #:    [id number] Weight:       138.0 lb Date of Birth:  1946-07-20   BSA:          1.838 m Patient Age:    57 years   BP:           114/78 mmHg Patient Gender: M          HR:           63 bpm. Exam Location:  Inpatient Procedure: 3D Echo, TEE-Intraopertive, Cardiac Doppler and Color Doppler Indications:     I35.0 Nonrheumatic aortic (valve) stenosis  History:         Patient has prior history of Echocardiogram examinations, most                  recent 03/15/2021. COPD, Aortic Valve Disease; Risk                  Factors:Hypertension, Current Smoker and Diabetes. Aortic                  stenosis.  Sonographer:     Roseanna Rainbow RDCS Referring Phys:  [place] Diagnosing Phys: Sanda Klein MD  Sonographer Comments: TAVR procedure using 28mm Edwards Sapien valve PROCEDURE: After discussion of the risks and benefits of a TEE, an informed consent was obtained from the patient. The transesophogeal probe was passed without difficulty through the esophogus of the patient. Sedation performed by different physician. The patient was monitored while under deep sedation. The patient developed no complications during the procedure. Continuous propofol. PRE-PROCEDURAL FINDINGS: Mildly decreased left ventricular systolic function. Estimated LVEF 45-50%. There are no regional wall motion abnormalities. Moderate to severe (low flow, low gradient) calcific aortic stenosis. Trileaflet aortic valve. Peak aortic valve gradient 30 mm Hg, mean gradient 16 mm Hg. Dimensionless obstructive index 0.27, calculated aortic valve area is 1.13 cm by the continuity equation and 1.12 cm by planimetry. No pericardial effusion. POST-PROCEDURAL FINDINGS: Improved left ventricular systolic function. Estimated LVEF 55-60%. There are no regional wall motion abnormalities. Well-seated 40mm Edwards Sapien TAVR stent-valve. Peak aortic valve gradient 5 mm Hg, mean gradient 2 mm Hg. Dimensionless obstructive index 0.77, calculated aortic valve area is 3.3 cm. Acceleration time is 81 ms There is a trivial perivalvular leak in the area of the noncoronary cusp. No mitral insufficiency. No pericardial effusion.  IMPRESSIONS  1. Left ventricular ejection fraction, by estimation, is 45 to 50%. The left ventricle has mildly decreased function. The left ventricle demonstrates global hypokinesis.  2. Right ventricular systolic function is normal. The right ventricular size is normal.  3. No left atrial/left atrial appendage thrombus was detected.  4. The mitral valve is normal in structure. Trivial mitral valve regurgitation.  5. The aortic valve is tricuspid. There is severe calcifcation of the aortic  valve. There is severe thickening of the aortic valve. Aortic valve regurgitation is moderate  to severe.  6. The aortic arch could not be seen. Aortic dilatation noted. There is borderline dilatation of the ascending aorta, measuring 37 mm. There is Severe (Grade IV) protruding plaque involving the descending aorta. FINDINGS  Left Ventricle: Left ventricular ejection fraction, by estimation, is 45 to 50%. The left ventricle has mildly decreased function. The left ventricle demonstrates global hypokinesis. The left ventricular internal cavity size was normal in size. Right Ventricle: The right ventricular size is normal. No increase in right ventricular wall thickness. Right ventricular systolic function is normal. Left Atrium: Left atrial size was normal in size. No left atrial/left atrial appendage thrombus was detected. Right Atrium: Right atrial size was normal in size. Pericardium: There is no evidence of pericardial effusion. Mitral Valve: The mitral valve is normal in structure. Trivial mitral valve regurgitation. Tricuspid Valve: The tricuspid valve is normal in structure. Tricuspid valve regurgitation is not demonstrated. Aortic Valve: The aortic valve is tricuspid. There is severe calcifcation of the aortic valve. There is severe thickening of the aortic valve. Aortic valve regurgitation is moderate to severe. Aortic valve mean gradient measures 16.4 mmHg. Aortic valve peak gradient measures 29.8 mmHg. Aortic valve area, by VTI measures 1.13 cm. There is a 29 mm Magna Sapien prosthetic, stented (TAVR) valve present in the aortic position. Pulmonic Valve: The pulmonic valve was not well visualized. Pulmonic valve regurgitation is not visualized. Aorta: The aortic arch could not be seen. Aortic dilatation noted. There is borderline dilatation of the ascending aorta, measuring 37 mm. There is severe (Grade IV) protruding plaque involving the descending aorta. IAS/Shunts: No atrial level shunt detected by  color flow Doppler.  LEFT VENTRICLE PLAX 2D LVOT diam:     2.30 cm LV SV:         64 LV SV Index:   35 LVOT Area:     4.15 cm  AORTIC VALVE AV Area (Vmax):    1.26 cm AV Area (Vmean):   1.25 cm AV Area (VTI):     1.13 cm AV Vmax:           272.90 cm/s AV Vmean:          188.512 cm/s AV VTI:            0.569 m AV Peak Grad:      29.8 mmHg AV Mean Grad:      16.4 mmHg LVOT Vmax:         82.60 cm/s LVOT Vmean:        56.600 cm/s LVOT VTI:          0.155 m LVOT/AV VTI ratio: 0.27  SHUNTS Systemic VTI:  0.16 m Systemic Diam: 2.30 cm Sanda Klein MD Electronically signed by Sanda Klein MD Signature Date/Time: 06/26/2021/11:07:47 AM    Final    Structural Heart Procedure  Result Date: 06/26/2021 See surgical note for result.  Disposition   Pt is being discharged home today in good condition.  Follow-up Plans & Appointments     Follow-up Information     Eileen Stanford, PA-C. Go on 07/05/2021.   Specialties: Cardiology, Radiology Why: @ 1:30pm, please arrive at least 10 minutes early. Contact information: [place] [id number] [phone]                Discharge Instructions     Amb Referral to Cardiac Rehabilitation   Complete by: As directed    Diagnosis: Valve Replacement   Valve: Aortic Comment - TAVR  After initial evaluation and assessments completed: Virtual Based Care may be provided alone or in conjunction with Phase 2 Cardiac Rehab based on patient barriers.: Yes       Discharge Medications   Allergies as of 06/27/2021       Reactions   Macadamia Nut Oil Anaphylaxis   Throat swelling and can't breathe        Medication List     STOP taking these medications    aspirin 325 MG EC tablet Replaced by: aspirin 81 MG chewable tablet   esomeprazole 20 MG capsule Commonly known as: [place] Replaced by: pantoprazole 40 MG tablet   ibuprofen 200 MG tablet Commonly known as: ADVIL       TAKE these medications     aspirin 81 MG chewable tablet Chew 1 tablet (81 mg total) by mouth daily. Can swallow whole Start taking on: June 28, 2021 Replaces: aspirin 325 MG EC tablet   brimonidine 0.2 % ophthalmic solution Commonly known as: ALPHAGAN Place 1 drop into both eyes 3 (three) times daily.   clopidogrel 75 MG tablet Commonly known as: PLAVIX Take 1 tablet (75 mg total) by mouth daily with breakfast. Start taking on: June 28, 2021   diphenhydrAMINE 25 MG tablet Commonly known as: BENADRYL Take 25 mg by mouth daily as needed for allergies.   furosemide 40 MG tablet Commonly known as: LASIX Take 0.5 tablets (20 mg total) by mouth as needed for fluid or edema. What changed:  when to take this reasons to take this   hydrochlorothiazide 12.5 MG tablet Commonly known as: HYDRODIURIL Take 12.5 mg by mouth daily.   hydrocortisone cream 1 % Apply 1 application topically 2 (two) times daily as needed (bug bites).   latanoprost 0.005 % ophthalmic solution Commonly known as: XALATAN Place 1 drop into both eyes at bedtime.   Melatonin 10 MG Caps Take 30-40 mg by mouth at bedtime.   metFORMIN 1000 MG tablet Commonly known as: GLUCOPHAGE Take 1,000 mg by mouth 2 (two) times daily with a meal. Notes to patient: Okay to resume Metformin after 48 hours after contrast dye exposure (6/30 PM)    neomycin-bacitracin-polymyxin ointment Commonly known as: NEOSPORIN Apply 1 application topically as needed for wound care.   pantoprazole 40 MG tablet Commonly known as: PROTONIX Take 1 tablet (40 mg total) by mouth daily. Start taking on: June 28, 2021 Replaces: esomeprazole 20 MG capsule   pravastatin 40 MG tablet Commonly known as: PRAVACHOL Take 40 mg by mouth every evening.   timolol 0.5 % ophthalmic solution Commonly known as: BETIMOL Place 1 drop into both eyes daily.          Outstanding Labs/Studies   BMET  Duration of Discharge Encounter   Greater than 30 minutes including  physician time.  SignedAngelena Form, PA-C 06/27/2021, 12:30 PM [phone]

## 2021-06-27 NOTE — Progress Notes (Signed)
1 Day Post-Op Procedure(s) (LRB): TRANSCATHETER AORTIC VALVE REPLACEMENT, LEFT SUBCLAVIAN (Left) TRANSESOPHAGEAL ECHOCARDIOGRAM (TEE) (N/A) Subjective: No complaints this am. Ambulated last pm but not yet today. Had a few episodes of vomiting last night and says he did not like the food but feels ok this am.   Objective: Vital signs in last 24 hours: Temp:  [97.6 F (36.4 C)-98.7 F (37.1 C)] 98.7 F (37.1 C) (06/29 0328) Pulse Rate:  [0-201] 72 (06/29 0328) Cardiac Rhythm: Normal sinus rhythm (06/28 1935) Resp:  [17-26] 20 (06/29 0328) BP: (99-171)/(67-97) 144/77 (06/29 0328) SpO2:  [90 %-100 %] 95 % (06/29 0328) Weight:  [60.4 kg] 60.4 kg (06/29 0448)  Hemodynamic parameters for last 24 hours:    Intake/Output from previous day: 06/28 0701 - 06/29 0700 In: 1302.1 [I.V.:1002.1; IV Piggyback:300] Out: 450 [Urine:400; Blood:50] Intake/Output this shift: No intake/output data recorded.  General appearance: alert and cooperative Neurologic: intact Heart: regular rate and rhythm and no murmur Lungs: clear to auscultation bilaterally Abdomen: soft, non-tender; bowel sounds normal Extremities: extremities normal, warm,  edema Wound: left chest incision ok. Left groin site ok   Lab Results: Recent Labs    06/26/21 1041 06/27/21 0121  WBC  --  10.2  HGB 12.2* 12.6*  HCT 36.0* 39.2  PLT  --  PLATELET CLUMPS NOTED ON SMEAR, UNABLE TO ESTIMATE   BMET:  Recent Labs    06/26/21 1041 06/27/21 0121  NA 137 134*  K 4.2 5.5*  CL 100 99  CO2  --  28  GLUCOSE 65* 98  BUN 11 12  CREATININE 0.60* 0.70  CALCIUM  --  8.9    PT/INR: No results for input(s): LABPROT, INR in the last 72 hours. ABG    Component Value Date/Time   PHART 7.296 (L) 06/26/2021 0907   HCO3 26.3 06/26/2021 0907   TCO2 29 06/26/2021 1041   ACIDBASEDEF 1.0 06/26/2021 0907   O2SAT 100.0 06/26/2021 0907   CBG (last 3)  Recent Labs    06/26/21 2059 06/26/21 2236 06/27/21 0634  GLUCAP 75 86 140*    ECG: sinus, no acute changes, no heart block  Assessment/Plan: S/P Procedure(s) (LRB): TRANSCATHETER AORTIC VALVE REPLACEMENT, LEFT SUBCLAVIAN (Left) TRANSESOPHAGEAL ECHOCARDIOGRAM (TEE) (N/A)  POD 1 Hemodynamically stable in sinus rhythm Plan 2D echo this am and home later after eating and ambulation. ASA and Plavix.   LOS: 1 day    Gaye Pollack 06/27/2021

## 2021-06-27 NOTE — Progress Notes (Signed)
Mobility Specialist: Progress Note   06/27/21 1255  Mobility  Activity Ambulated in hall  Level of Assistance Standby assist, set-up cues, supervision of patient - no hands on  Assistive Device None  Distance Ambulated (ft) 280 ft  Mobility Ambulated independently in hallway  Mobility Response Tolerated well  Mobility performed by Mobility specialist  $Mobility charge 1 Mobility   Pre-Mobility: 81 HR During Mobility: 109 HR Post-Mobility: 79 HR, 143/71 BP  Pt c/o feeling dizzy towards beginning of ambulation but progressed better as we continued. Pt back to bed per request with call bell at his side.   [facility] Patrick Beck Mobility Specialist Mobility Specialist Phone: [phone]

## 2021-06-27 NOTE — Plan of Care (Signed)
  Problem: Clinical Measurements: Goal: Respiratory complications will improve Outcome: Progressing Goal: Cardiovascular complication will be avoided Outcome: Progressing   

## 2021-06-27 NOTE — Progress Notes (Signed)
Patient had another episode of emesis. Mostly liquid. Patient did not want any ginger ale or saltines. Will continue to monitor

## 2021-06-28 ENCOUNTER — Telehealth: Payer: Self-pay | Admitting: Physician Assistant

## 2021-06-28 NOTE — Telephone Encounter (Signed)
  [facility] VALVE TEAM   Patient contacted regarding discharge from [facility] on 6/29  Patient understands to follow up with provider Nell Range on 7/7 at [facility].  Patient understands discharge instructions? yes Patient understands medications and regimen? yes Patient understands to bring all medications to this visit? yes  Angelena Form PA-C  [facility]

## 2021-07-03 ENCOUNTER — Telehealth: Payer: Self-pay | Admitting: Cardiovascular Disease

## 2021-07-03 NOTE — Telephone Encounter (Signed)
I see him this Thursday. I will give him a call and see if I think he needs abx

## 2021-07-03 NOTE — Telephone Encounter (Signed)
Patient is following up regarding his TAVR on 06/26/21. He states on 07/02 everything was fine and he removed his bandage and gauze with minor blood. No issues. However, when he woke up on 07/03 he noticed bruising at the IV site. States the bruising extended from his wrist to his armpit. At states the is also a dime-sized lump at the  site of the incision and it is sore/tender to touch.  He states he has also experienced some itchiness in the area. She assumes it may have gotten infected and assumes he may need an antibiotic.

## 2021-07-03 NOTE — Telephone Encounter (Signed)
Patient sent me a photo of arm (copied below). No fevers, chills, warmth or edema. It hurts with hyperextension, which I have asked him not to do. I think this is just diffuse ecchymosis from an IV. Interestingly, it was delayed and appeared over the weekend. He thinks it's improving. He will continue to monitor and I will assess in the office when I see him on Thursday. No abx indicated at this time.

## 2021-07-05 ENCOUNTER — Other Ambulatory Visit: Payer: Self-pay

## 2021-07-05 ENCOUNTER — Encounter: Payer: Self-pay | Admitting: Physician Assistant

## 2021-07-05 ENCOUNTER — Ambulatory Visit: Payer: Medicare Other | Admitting: Physician Assistant

## 2021-07-05 VITALS — BP 140/82 | HR 78 | Ht 73.0 in | Wt 135.8 lb

## 2021-07-05 DIAGNOSIS — I739 Peripheral vascular disease, unspecified: Secondary | ICD-10-CM

## 2021-07-05 DIAGNOSIS — Z720 Tobacco use: Secondary | ICD-10-CM

## 2021-07-05 DIAGNOSIS — I10 Essential (primary) hypertension: Secondary | ICD-10-CM | POA: Diagnosis not present

## 2021-07-05 DIAGNOSIS — S40021A Contusion of right upper arm, initial encounter: Secondary | ICD-10-CM

## 2021-07-05 DIAGNOSIS — Z952 Presence of prosthetic heart valve: Secondary | ICD-10-CM

## 2021-07-05 DIAGNOSIS — R935 Abnormal findings on diagnostic imaging of other abdominal regions, including retroperitoneum: Secondary | ICD-10-CM

## 2021-07-05 DIAGNOSIS — E875 Hyperkalemia: Secondary | ICD-10-CM

## 2021-07-05 DIAGNOSIS — K2970 Gastritis, unspecified, without bleeding: Secondary | ICD-10-CM

## 2021-07-05 NOTE — Patient Instructions (Signed)
Medication Instructions:  1.) stop Lasix (furosemide)  *If you need a refill on your cardiac medications before your next appointment, please call your pharmacy*   Lab Work: Today: BMET If you have labs (blood work) drawn today and your tests are completely normal, you will receive your results only by: [place] (if you have MyChart) OR A paper copy in the mail If you have any lab test that is abnormal or we need to change your treatment, we will call you to review the results.   Testing/Procedures: none   Follow-Up: As planned   Other Instructions No referral to GI placed at this time-pt declines.

## 2021-07-05 NOTE — Progress Notes (Addendum)
HEART AND [place]                                     Cardiology Office Note:    Date:  07/05/2021   ID:  Patrick Beck, DOB 1946/02/04, MRN [id number]  PCP:  Lemmie Evens, MD  [facility] Cardiologist:  Rozann Lesches, MD  / Dr. Angelena Form & Dr. Cyndia Bent (TAVR) [facility] Electrophysiologist:  None   Referring MD: Lemmie Evens, MD   [facility] s/p TAVR  History of Present Illness:    Patrick Beck is a 75 y.o. 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   Patient states that he has known of presence of a heart murmur for many years.  He states that he was in his usual state of health without any physical limitations until fall 2019 when he was hospitalized in [place] for severe necrotizing pneumonia.  He states that he never completely recovered.  Echocardiogram performed at that time reportedly revealed mild aortic stenosis with normal left ventricular systolic function.  The patient was seen in follow-up recently by Dr. Domenic Polite and complaining of worsening symptoms of exertional shortness of breath.  Follow-up echocardiogram performed March 15, 2021 revealed normal left ventricular function with ejection fraction estimated 60 to 65%.  The aortic valve appeared functionally bicuspid with severe calcification and probably severe aortic stenosis.  Peak velocity across aortic valve measured 3.0 m/s but the dimensionless index was notably quite low at 0.23 and aortic valve area calculated 0.72 cm by VTI.  The patient was referred to the multidisciplinary heart valve clinic and has been evaluated previously by Dr. Angelena Form. Diagnostic cardiac catheterization performed April 18, 2021 revealed mild nonobstructive coronary artery disease normal right-sided pressures and preserved cardiac output.   He was evaluated by the  multidisciplinary valve team and underwent successful TAVR with a 29 mm Edwards Sapien 3 Ultra THV via the left subclavian approach on 06/26/21. Post operative echo showed EF 55%, normally functioning TAVR with a mean gradient of 5.5 mmHg and trivial PVL. He was discharged on aspirin and plavix. Of note he was discharged on home HCTZ but lasix changed to PRN. Of note, K was noted to be elevated but there was evidence of hemolysis.    Today the patient presents to clinic for follow up. Doing well aside from right arm which developed acute ecchymosis last Saturday. Also has a little lump on arm where IV site was. No CP or SOB. No LE edema, orthopnea or PND. No dizziness or syncope. No blood in stool or urine. No palpitations. Still smoking 2PPD. He did not understand the instructions for PRN lasix and has continued on it.    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 Angelena Form & Dr. Cyndia Bent   Severe aortic stenosis    Type 2 diabetes mellitus [facility])     Past Surgical History:  Procedure Laterality Date   CARDIAC CATHETERIZATION     COLONOSCOPY N/A 04/06/2013   Procedure: COLONOSCOPY;  Surgeon: Jamesetta So, MD;  [facility];  Service: Gastroenterology;  Laterality: N/A;   EYE SURGERY Left    laser sx fr glaucoma   HERNIA REPAIR     Three prior surgeries before 1966   MULTIPLE EXTRACTIONS WITH ALVEOLOPLASTY N/A 05/24/2021   Procedure: EXTRACTION OF TEETH NUMBER TWO, THREE,  FIVE, SIX, SEVEN, EIGHT, NINE, TEN, ELEVEN, TWELVE, SEVENTEEN, EIGHTTEEN, NINETEEN, TWENTY, TWENTY-ONE, TWENTY- TWO, TWENTY-FOUR, THWENTY-SEVEN, TWENTY-EIGHT, TWENTY-NINE WITH ALVEOLOPLASTY IN ALL FOUR QUADRANTS;  Surgeon: [name], DMD;  Location: [place];  Service: Dentistry;  Laterality: N/A;   RIGHT/LEFT HEART CATH AND CORONARY ANGIOGRAPHY N/A 04/18/2021   Procedure: RIGHT/LEFT HEART CATH AND CORONARY ANGIOGRAPHY;  Surgeon: Burnell Blanks, MD;  Location: [place] CV LAB;  Service: Cardiovascular;  Laterality: N/A;   TEE WITHOUT CARDIOVERSION N/A 06/26/2021   Procedure: TRANSESOPHAGEAL ECHOCARDIOGRAM (TEE);  Surgeon: Burnell Blanks, MD;  Location: [place];  Service: Open Heart Surgery;  Laterality: N/A;   TONSILLECTOMY     at age 34    Current Medications: Current Meds  Medication Sig   ACCU-CHEK GUIDE test strip SMARTSIG:Via Meter 4-5 Times Daily   aspirin 81 MG chewable tablet Chew 1 tablet (81 mg total) by mouth daily. Can swallow whole   brimonidine (ALPHAGAN) 0.2 % ophthalmic solution Place 1 drop into both eyes 3 (three) times daily.   clopidogrel (PLAVIX) 75 MG tablet Take 1 tablet (75 mg total) by mouth daily with breakfast.   diphenhydrAMINE (BENADRYL) 25 MG tablet Take 25 mg by mouth daily as needed for allergies.   hydrochlorothiazide (HYDRODIURIL) 12.5 MG tablet Take 12.5 mg by mouth daily.   hydrocortisone cream 1 % Apply 1 application topically 2 (two) times daily as needed (bug bites).   latanoprost (XALATAN) 0.005 % ophthalmic solution Place 1 drop into both eyes at bedtime.   Melatonin 10 MG CAPS Take 30-40 mg by mouth at bedtime.   metFORMIN (GLUCOPHAGE) 1000 MG tablet Take 1,000 mg by mouth 2 (two) times daily with a meal.   neomycin-bacitracin-polymyxin (NEOSPORIN) ointment Apply 1 application topically as needed for wound care.   pantoprazole (PROTONIX) 40 MG tablet Take 1 tablet (40 mg total) by mouth daily.   pravastatin (PRAVACHOL) 40 MG  tablet Take 40 mg by mouth every evening.   timolol (BETIMOL) 0.5 % ophthalmic solution Place 1 drop into both eyes daily.   [DISCONTINUED] furosemide (LASIX) 40 MG tablet Take 0.5 tablets (20 mg total) by mouth as needed for fluid or edema.     Allergies:   Macadamia nut oil   Social History   Socioeconomic History   Marital status: Divorced    Spouse name: Not on file   Number of children: 0   Years of education: Not on file   Highest education level: Not on file  Occupational History   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  Tobacco Use   Smoking status: Heavy Smoker    Packs/day: 2.00    Years: 56.00    Pack years: 112.00    Types: Cigarettes   Smokeless tobacco: Never   Tobacco comments:    pt refuses hand outs   Vaping Use  Vaping Use: Never used  Substance and Sexual Activity   Alcohol use: No    Comment: 07/1999   Drug use: No   Sexual activity: Not on file  Other Topics Concern   Not on file  Social History Narrative   Not on file   Social Determinants of Health   Financial Resource Strain: Not on file  Food Insecurity: Not on file  Transportation Needs: Not on file  Physical Activity: Not on file  Stress: Not on file  Social Connections: Not on file     Family History: The patient's family history includes Aortic stenosis in his father; COPD in his mother. There is no history of Cancer.  ROS:   Please see the history of present illness.    All other systems reviewed and are negative.  EKGs/Labs/Other Studies Reviewed:    The following studies were reviewed today:  TAVR OPERATIVE NOTE     Date of Procedure:                06/26/2021   Preoperative Diagnosis:      Severe Aortic Stenosis   Postoperative Diagnosis:    Same   Procedure:        Transcatheter Aortic Valve Replacement - Left Subclavian Approach             Edwards Sapien 3 THV (size 29 mm, model # 9600TFX, serial # [id number])              Co-Surgeons:                         Gaye Pollack, MD and [name], MD   Anesthesiologist:                  Orland Jarred, MD   Echocardiographer:              Bertrum Sol, MD   Pre-operative Echo Findings: Severe aortic stenosis Normal left ventricular systolic function   Post-operative Echo Findings: No paravalvular leak Normal left ventricular systolic function   _____________   Echo 06/26/21 IMPRESSIONS  1. Left ventricular ejection fraction, by estimation, is 55 to 60%. The  left ventricle has normal function. The left ventricle has no regional  wall motion abnormalities. There is mild concentric left ventricular  hypertrophy. Left ventricular diastolic  parameters are consistent with Grade I diastolic dysfunction (impaired  relaxation).   2. Right ventricular systolic function is normal. The right ventricular  size is normal. Tricuspid regurgitation signal is inadequate for assessing  PA pressure.   3. The mitral valve is normal in structure. No evidence of mitral valve  regurgitation.   4. The aortic valve has been repaired/replaced. Aortic valve  regurgitation is trivial. There is a 29 mm Sapien prosthetic (TAVR) valve  present in the aortic position. Procedure Date: 06/26/2021. Echo findings  are consistent with a trivial perivalvular  leak of the aortic prosthesis. Otherwise normal prosthesis function.  Aortic valve mean gradient measures 5.5 mmHg. Aortic valve Vmax measures  1.46 m/s. Aortic valve acceleration time measures 99 msec.   EKG:  EKG is  ordered today.  The ekg ordered today demonstrates sinus HR 78 bpm  Recent Labs: 06/22/2021: ALT 31 06/27/2021: BUN 12; Creatinine, Ser 0.70; Hemoglobin 12.6; Magnesium 1.5; Platelets PLATELET CLUMPS NOTED ON SMEAR, UNABLE TO ESTIMATE; Potassium 5.5; Sodium 134  Recent Lipid Panel No results found for: CHOL, TRIG, HDL, CHOLHDL, VLDL, LDLCALC, LDLDIRECT   Risk Assessment/Calculations:  Physical Exam:    VS:  BP 140/82   Pulse 78    Ht 6\' 1"  (1.854 m)   Wt 135 lb 12.8 oz (61.6 kg)   SpO2 93%   BMI 17.92 kg/m     Wt Readings from Last 3 Encounters:  07/05/21 135 lb 12.8 oz (61.6 kg)  06/27/21 133 lb 2.5 oz (60.4 kg)  06/22/21 137 lb 12.8 oz (62.5 kg)     GEN:  Well nourished, well developed in no acute distress, appears older than stated age 64: Normal NECK: No JVD; No carotid bruits LYMPHATICS: No lymphadenopathy CARDIAC: RRR, no murmurs, rubs, gallops RESPIRATORY:  rhonchorous breath sounds and slight wheeze ABDOMEN: Soft, non-tender, non-distended MUSCULOSKELETAL:  No edema; No deformity  SKIN: Warm and dry.  Groin sites clear without hematoma or ecchymosis  NEUROLOGIC:  Alert and oriented x 3 PSYCHIATRIC:  Normal affect   ASSESSMENT:    1. S/P TAVR (transcatheter aortic valve replacement)   2. Essential hypertension   3. PAD (peripheral artery disease) ([place])   4. Gastritis, presence of bleeding unspecified, unspecified chronicity, unspecified gastritis type   5. Hyperkalemia   6. Arm bruise, right, initial encounter   7. Tobacco abuse    PLAN:    In order of problems listed above:  Severe AS s/p TAVR: doing well. Groin/subclavian sites are healing well. ECG with no high grade heart block. Continue on Asprin 81 mg daily and Plavix 75mg  daily. SBE prophylaxis discussed; the patient is edentulous and does not go to the dentist.   HTN: BP borderline. Continue on home HCTZ. I had changed to Lasix 40mg  to PRN for LE edema which was attributed to Norvasc and AS. Has not had LE edema since Norvac stopped. Will stop Lasix completley as he has continued to take it.    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    Abnormal CT of stomach: pre TAVR CT showed possible diffuse mural thickening throughout the  stomach. Clinical correlation for gastritis is recommended. The possibility of infiltrative gastric neoplasm is not entirely excluded. Consideration for further evaluation with nonemergent endoscopy is recommended if clinically appropriate. I discussed this with him in detail and he is clear that he doesn't want to be referred to GI. He reports a long history of gastritis and PPI use.    Hyperkalemia: K 5.5 on the day of discharge but hemolysis noted. Labs previously normal although does have a history of hyperkalemia with ACEi in past, which was discontinued. Check BMET today.   Right arm ecchymosis: he has diffuse ecchymosis at site of IV. He reports scratching a bug bite and developing a hematoma after this last Saturday. There is a mild lump at IV site and resolving bruising. IF this gets any worse, will plan for an UE duplex. No s/s of infection.   Tobacco abuse: still smoking 2 PPD. Will try to use some old nicotine patches to cut back   _____________________  [facility] 7/19 @ 11AM: pt called back and wants to be referred to GI for evaluation of abnormal CT of stomach with diffuse mural thickening. Radiology had recommended non emergent endoscopy. Will refer to GI.     Cardiac Rehabilitation Eligibility Assessment: The patient is ready to start Cardiac Rehab pending clearance from the cardiac surgeon.  Medication Adjustments/Labs and Tests Ordered: Current medicines are reviewed at length with the patient today.  Concerns regarding medicines are outlined above.  Orders Placed This Encounter  Procedures   Basic Metabolic Panel (BMET)   EKG 12-Lead   No orders of the defined types were placed in this encounter.   Patient Instructions  Medication Instructions:  1.) stop Lasix (furosemide)  *If you need a refill on your cardiac medications before your next appointment, please call your pharmacy*   Lab Work: Today: BMET If you have labs (blood work) drawn today and your  tests are completely normal, you will receive your results only by: [place] (if you have MyChart) OR A paper copy in the mail If you have any lab test that is abnormal or we need to change your treatment, we will call you to review the results.   Testing/Procedures: none   Follow-Up: As planned   Other Instructions No referral to GI placed at this time-pt declines.    Signed, Angelena Form, PA-C  07/05/2021 7:53 PM    [facility]

## 2021-07-06 ENCOUNTER — Telehealth: Payer: Self-pay | Admitting: *Deleted

## 2021-07-06 DIAGNOSIS — E875 Hyperkalemia: Secondary | ICD-10-CM

## 2021-07-06 LAB — BASIC METABOLIC PANEL
BUN/Creatinine Ratio: 15 (ref 10–24)
BUN: 12 mg/dL (ref 8–27)
CO2: 28 mmol/L (ref 20–29)
Calcium: 8.4 mg/dL — ABNORMAL LOW (ref 8.6–10.2)
Chloride: 102 mmol/L (ref 96–106)
Creatinine, Ser: 0.78 mg/dL (ref 0.76–1.27)
Glucose: 152 mg/dL — ABNORMAL HIGH (ref 65–99)
Potassium: 5.6 mmol/L — ABNORMAL HIGH (ref 3.5–5.2)
Sodium: 140 mmol/L (ref 134–144)
eGFR: 94 mL/min/{1.73_m2} (ref >59)

## 2021-07-06 NOTE — Telephone Encounter (Signed)
Spoke with pt and reviewed results and recommendations. Pt agreeable to plan.  Pt mentioned that he is seeing PCP next week and they will plan to draw labs later next week.  Advised pt I am going to go ahead and order the lab at [facility] to be done in [place] he needs it, otherwise just have PCP send [place] the labs they draw.  Pt agreeable to plan.

## 2021-07-06 NOTE — Telephone Encounter (Signed)
-----   Message from Eileen Stanford, PA-C sent at 07/06/2021  9:33 AM EDT ----- Potassium is still high for unclear reasons. Kidney function is normal and on both lasix and HCTZ. I stopped his lasix yesterday which could make his potassium even higher. Let's have him stay on lasix (put 40mg  back on list) for now and repeat labs in a week. We may need to start him on a medication to reduce potassium. May need work up of this with PCP

## 2021-07-09 ENCOUNTER — Telehealth (HOSPITAL_BASED_OUTPATIENT_CLINIC_OR_DEPARTMENT_OTHER): Payer: Self-pay | Admitting: Pharmacist

## 2021-07-09 ENCOUNTER — Other Ambulatory Visit (HOSPITAL_BASED_OUTPATIENT_CLINIC_OR_DEPARTMENT_OTHER): Payer: Self-pay

## 2021-07-09 ENCOUNTER — Other Ambulatory Visit (HOSPITAL_COMMUNITY): Payer: Self-pay

## 2021-07-09 NOTE — Telephone Encounter (Signed)
Transitions of Care Pharmacy   Call attempted for a pharmacy transitions of care follow-up. Patient unable to talk at this time, requesting call back later in the day.   Call attempt #1.

## 2021-07-09 NOTE — Telephone Encounter (Signed)
Pharmacy Transitions of Care Follow-up Telephone Call  Date of discharge: 06/27/2021  Discharge Diagnosis: Transcatheter aortic valve replacement  How have you been since you were released from the hospital? Doing well and recovering slowly. Has not had any noticeable side effects. Easily bruising, however, this was common prior to clopidogrel therapy. Denies any of signs or symptoms of bleeding. Wounds healing well (some itchiness).    Medication changes made at discharge:  - START: clopidogrel 75 mg once daily, pantoprazole 40 mg once daily,   - STOPPED: esomeprazole, ibuprofen   - CHANGED: changed aspirin 325 mg to 81 mg aspirin  Medication changes verified by the patient? Yes    Medication Accessibility:  Home Pharmacy: [facility] in [place]  Was the patient provided with refills on discharged medications? Yes   Have all prescriptions been transferred from [facility] to home pharmacy? Yes - I have now completed   Is the patient able to afford medications? Yes    Medication Review:  CLOPIDOGREL (PLAVIX) Clopidogrel 75 mg once daily.  - Educated patient on potential duration of therapy with clopidogrel. Advised patient that aspirin will be continued indefinitely.  - Reviewed potential DDIs with patient  - Advised patient of medications to avoid (NSAIDs, ASA)  - Educated that Tylenol (acetaminophen) will be the preferred analgesic to prevent risk of bleeding  - Emphasized importance of monitoring for signs and symptoms of bleeding (abnormal bruising, prolonged bleeding, nose bleeds, bleeding from gums, discolored urine, black tarry stools)  - Advised patient to alert all providers of anticoagulation therapy prior to starting a new medication or having a procedure    Follow-up Appointments:  Cardiology follow-up appointment confirmed? YES - Scheduled to see Angelena Form, PA on 07/27/2021.   If their condition worsens, is the pt aware to call PCP or go to the Emergency Dept.?  YES  Final Patient Assessment: He is doing well today. He had a few questions regarding how long he should expect to be on clopidogrel, advised that this was up to the overseeing provider and could be discussed at next follow-up. Discussed the importance of switching esomeprazole to pantoprazole due to DDI with clopidogrel. I have transferred his aspirin, clopidogrel and pantoprazole to his home pharmacy in [place].   Harriet Pho, PharmD Clinical Pharmacist [facility]  07/09/2021 3:43 PM

## 2021-07-17 NOTE — Addendum Note (Signed)
Addended by: Angelena Form R on: 07/17/2021 11:02 AM   Modules accepted: Orders

## 2021-07-19 ENCOUNTER — Other Ambulatory Visit: Payer: Self-pay | Admitting: Nurse Practitioner

## 2021-07-19 DIAGNOSIS — E875 Hyperkalemia: Secondary | ICD-10-CM

## 2021-07-26 NOTE — Progress Notes (Signed)
[facility] [place]                                     Cardiology Office Note:    Date:  07/27/2021   ID:  [name], DOB May 31, 1946, MRN [id number]  PCP:  Lemmie Evens, MD  [facility] Cardiologist:  Rozann Lesches, MD  / Dr. Angelena Form & Dr. Cyndia Bent (TAVR) [facility] Electrophysiologist:  None   Referring MD: Lemmie Evens, MD   1 month s/p TAVR  History of Present Illness:    Emari Sibbett is a 75 y.o. 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   Patient states that he has known of presence of a heart murmur for many years.  He states that he was in his usual state of health without any physical limitations until fall 2019 when he was hospitalized in [place] for severe necrotizing pneumonia.  He states that he never completely recovered.  Echocardiogram performed at that time reportedly revealed mild aortic stenosis with normal left ventricular systolic function.  The patient was seen in follow-up recently by Dr. Domenic Polite and complaining of worsening symptoms of exertional shortness of breath.  Follow-up echocardiogram performed March 15, 2021 revealed normal left ventricular function with ejection fraction estimated 60 to 65%.  The aortic valve appeared functionally bicuspid with severe calcification and probably severe aortic stenosis.  Peak velocity across aortic valve measured 3.0 m/s but the dimensionless index was notably quite low at 0.23 and aortic valve area calculated 0.72 cm by VTI.  The patient was referred to the multidisciplinary heart valve clinic and has been evaluated previously by Dr. Angelena Form. Diagnostic cardiac catheterization performed April 18, 2021 revealed mild nonobstructive coronary artery disease normal right-sided pressures and preserved cardiac output.   He was evaluated by the  multidisciplinary valve team and underwent successful TAVR with a 29 mm Edwards Sapien 3 Ultra THV via the left subclavian approach on 06/26/21. Post operative echo showed EF 55%, normally functioning TAVR with a mean gradient of 5.5 mmHg and trivial PVL. He was discharged on aspirin and plavix. He was discharged on home HCTZ but lasix changed to PRN. Of note, K was noted to be elevated but there was evidence of hemolysis. At follow up he was still taking both HCTZ and  Lasix, which I kept him on given elevated potassium at 5.6. I asked him to come in for follow up labs but he wanted to do this with his PCP. Potassium 7/22 showed K down to 5.0.  Today the patient presents to clinic for follow up. Here with wife. Doing well. No CP or SOB. Only occasional LE edema. No orthopnea or PND. No dizziness or syncope. No blood in stool or urine. No palpitations.     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 Angelena Form & Dr. Cyndia Bent   Severe aortic stenosis    Type 2 diabetes mellitus [facility])     Past Surgical History:  Procedure Laterality Date   CARDIAC CATHETERIZATION     COLONOSCOPY N/A 04/06/2013   Procedure: COLONOSCOPY;  Surgeon: Jamesetta So, MD;  [facility];  Service: Gastroenterology;  Laterality: N/A;   EYE SURGERY Left    laser sx fr glaucoma   HERNIA REPAIR     Three prior surgeries before 1966   MULTIPLE EXTRACTIONS WITH ALVEOLOPLASTY N/A 05/24/2021   Procedure:  EXTRACTION OF TEETH NUMBER TWO, THREE, FIVE, SIX, SEVEN, EIGHT, NINE, TEN, ELEVEN, TWELVE, SEVENTEEN, EIGHTTEEN, NINETEEN, TWENTY, TWENTY-ONE, TWENTY- TWO, TWENTY-FOUR, THWENTY-SEVEN, TWENTY-EIGHT, TWENTY-NINE WITH ALVEOLOPLASTY IN ALL FOUR QUADRANTS;  Surgeon: [name], DMD;  Location: [place];  Service: Dentistry;  Laterality: N/A;   RIGHT/LEFT HEART CATH AND CORONARY ANGIOGRAPHY N/A 04/18/2021   Procedure: RIGHT/LEFT HEART CATH AND CORONARY ANGIOGRAPHY;  Surgeon: Burnell Blanks, MD;  Location: [place] CV LAB;  Service: Cardiovascular;  Laterality: N/A;   TEE WITHOUT CARDIOVERSION N/A 06/26/2021   Procedure: TRANSESOPHAGEAL ECHOCARDIOGRAM (TEE);  Surgeon: Burnell Blanks, MD;  Location: [place];  Service: Open Heart Surgery;  Laterality: N/A;   TONSILLECTOMY     at age 54    Current Medications: Current Meds  Medication Sig   ACCU-CHEK GUIDE test strip SMARTSIG:Via Meter 4-5 Times Daily   aspirin 81 MG chewable tablet Chew 1 tablet (81 mg total) by mouth daily. Can swallow whole   brimonidine (ALPHAGAN) 0.2 % ophthalmic solution Place 1 drop into both eyes 3 (three) times daily.   clopidogrel (PLAVIX) 75 MG tablet Take 1 tablet (75 mg total) by mouth daily with breakfast.   diphenhydrAMINE (BENADRYL) 25 MG tablet Take 25 mg by mouth daily as needed for allergies.   furosemide (LASIX) 40 MG tablet Take 40 mg by mouth.   hydrochlorothiazide (HYDRODIURIL) 12.5 MG tablet Take 12.5 mg by mouth daily.   hydrocortisone cream 1 % Apply 1 application topically 2 (two) times daily as needed (bug bites).   latanoprost (XALATAN) 0.005 % ophthalmic solution Place 1 drop into both eyes at bedtime.   Melatonin 10 MG CAPS Take 30-40 mg by mouth at bedtime.   metFORMIN (GLUCOPHAGE) 1000 MG tablet Take 1,000 mg by mouth 2 (two) times daily with a meal.   neomycin-bacitracin-polymyxin (NEOSPORIN) ointment Apply 1 application topically as needed for wound care.   pantoprazole (PROTONIX)  40 MG tablet Take 1 tablet (40 mg total) by mouth daily.   pravastatin (PRAVACHOL) 40 MG tablet Take 40 mg by mouth every evening.   timolol (BETIMOL) 0.5 % ophthalmic solution Place 1 drop into both eyes daily.     Allergies:   Macadamia nut oil   Social History   Socioeconomic History   Marital status: Divorced    Spouse name: Not on file   Number of children: 0   Years of education: Not on file   Highest education level: Not on file  Occupational History   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  Tobacco Use   Smoking status: Heavy Smoker    Packs/day: 2.00    Years: 56.00    Pack years: 112.00    Types: Cigarettes   Smokeless tobacco: Never   Tobacco comments:    pt refuses hand outs  Vaping Use   Vaping Use: Never used  Substance and Sexual Activity   Alcohol use: No    Comment: 07/1999   Drug use: No   Sexual activity: Not on file  Other Topics Concern   Not on file  Social History Narrative   Not on file   Social Determinants of Health   Financial Resource Strain: Not on file  Food Insecurity: Not on file  Transportation Needs: Not on file  Physical Activity: Not on file  Stress: Not on file  Social Connections: Not on file     Family History: The patient's family history includes Aortic stenosis in his father; COPD in his mother. There is no history of Cancer.  ROS:   Please see the history of present illness.    All other systems reviewed and are negative.  EKGs/Labs/Other Studies Reviewed:    The following studies were reviewed today:  TAVR OPERATIVE NOTE     Date of Procedure:                06/26/2021   Preoperative Diagnosis:      Severe Aortic Stenosis   Postoperative Diagnosis:    Same   Procedure:        Transcatheter Aortic Valve Replacement - Left Subclavian Approach             Edwards Sapien 3 THV (size 29 mm, model # 9600TFX, serial # [id number])              Co-Surgeons:                        Gaye Pollack, MD and  [name], MD   Anesthesiologist:                  Orland Jarred, MD   Echocardiographer:              Bertrum Sol, MD   Pre-operative Echo Findings: Severe aortic stenosis Normal left ventricular systolic function   Post-operative Echo Findings: No paravalvular leak Normal left ventricular systolic function   _____________   Echo 06/26/21 IMPRESSIONS  1. Left ventricular ejection fraction, by estimation, is 55 to 60%. The  left ventricle has normal function. The left ventricle has no regional  wall motion abnormalities. There is mild concentric left ventricular  hypertrophy. Left ventricular diastolic  parameters are consistent with Grade I diastolic dysfunction (impaired  relaxation).   2. Right ventricular systolic function is normal. The right ventricular  size is normal. Tricuspid regurgitation signal is inadequate for assessing  PA pressure.   3. The mitral valve is normal in structure. No evidence of mitral valve  regurgitation.   4. The aortic valve has been repaired/replaced. Aortic valve  regurgitation is trivial. There is a 29 mm Sapien prosthetic (TAVR) valve  present in the aortic position. Procedure Date: 06/26/2021. Echo findings  are consistent with a trivial perivalvular  leak of the aortic prosthesis. Otherwise normal prosthesis function.  Aortic valve mean gradient measures 5.5 mmHg. Aortic valve Vmax measures  1.46 m/s. Aortic valve acceleration time measures 99 msec.   _________________  Echo 07/27/21 IMPRESSIONS   1. Left ventricular ejection fraction, by estimation, is 60 to 65%. The  left ventricle has normal function. The left ventricle has no regional  wall motion abnormalities. There is mild left ventricular hypertrophy.  Left ventricular diastolic parameters  are consistent with Grade I diastolic dysfunction (impaired relaxation).   2. Right ventricular  systolic function is normal. The right ventricular  size is normal.   3. The mitral  valve is normal in structure. No evidence of mitral valve  regurgitation. No evidence of mitral stenosis.   4. Post TAVR impalnt 06/26/21 29 mm Sapien 3 valve gradients increased  slightly since 06/27/21 mean 5.5->9 mmHg peak 8.6 mm -> 18 mmHg Two jets of  PVL seen on apicla images #50 Both jets appear worse than TTE done  06/27/21. The aortic valve has been  repaired/replaced. Aortic valve regurgitation is not visualized. No aortic  stenosis is present.   5. The inferior vena cava is normal in size with greater than 50%  respiratory variability, suggesting right atrial pressure of 3 mmHg.   EKG:  EKG is  ordered today.    Recent Labs: 06/22/2021: ALT 31 06/27/2021: Hemoglobin 12.6; Magnesium 1.5; Platelets PLATELET CLUMPS NOTED ON SMEAR, UNABLE TO ESTIMATE 07/05/2021: BUN 12; Creatinine, Ser 0.78; Potassium 5.6; Sodium 140  Recent Lipid Panel No results found for: CHOL, TRIG, HDL, CHOLHDL, VLDL, LDLCALC, LDLDIRECT   Risk Assessment/Calculations:       Physical Exam:    VS:  BP 126/74   Pulse 85   Ht '6\' 1"'$  (1.854 m)   Wt 132 lb (59.9 kg)   SpO2 97%   BMI 17.42 kg/m     Wt Readings from Last 3 Encounters:  07/27/21 132 lb (59.9 kg)  07/05/21 135 lb 12.8 oz (61.6 kg)  06/27/21 133 lb 2.5 oz (60.4 kg)     GEN:  Well nourished, well developed in no acute distress, appears older than stated age 22: Normal NECK: No JVD; LYMPHATICS: No lymphadenopathy CARDIAC: RRR, no murmurs, rubs, gallops RESPIRATORY:  rhonchorous breath sounds  ABDOMEN: Soft, non-tender, non-distended MUSCULOSKELETAL:  No edema; No deformity  SKIN: Warm and dry.  NEUROLOGIC:  Alert and oriented x 3 PSYCHIATRIC:  Normal affect   ASSESSMENT:    1. S/P TAVR (transcatheter aortic valve replacement)   2. Essential hypertension   3. PAD (peripheral artery disease) ([place])   4. Abnormal CT of the abdomen   5. Hyperkalemia   6. Arm bruise, right, initial encounter   7. Tobacco abuse     PLAN:    In  order of problems listed above:  Severe AS s/p TAVR: echo today shows EF 60%, normally functioning TAVR with a mean gradient of 9 mm hg and two jets of PVL worse than on POD1 echo. Will discuss with team next week. He is doing well clinically with NYHA class II symptoms, likely related to COPD. Continue on Asprin 81 mg daily and Plavix '75mg'$  daily.  He can stop plavix after 6 months of therapy (11/2021). SBE prophylaxis discussed; the patient is edentulous and does not go to the dentist. I will see him back in 1 year with an echo  HTN: BP well controlled. No changes made   PAD: pre TAVR CT showed ectasia of ascending thoracic aorta (4.0 cm in diameter), mild fusiform aneurysmal dilatation of the infrarenal abdominal aorta (3.1 x 2.9 cm) and aneurysmal dilatation of the left common iliac artery (2.1 cm in diameter).    Abnormal CT of stomach: pre TAVR CT showed possible diffuse mural thickening throughout the stomach. Clinical correlation for gastritis is recommended. The possibility of infiltrative gastric neoplasm is not entirely excluded. Consideration for further evaluation with nonemergent endoscopy is recommended if clinically appropriate. He reports a long history of gastritis and PPI use. He has been referred to GI for  evaluation   Hyperkalemia: he has been on both lasix and HCTZ and K now back in normal range by labs at PCP. He will continue on both  Right arm ecchymosis: resolved.    Tobacco abuse: still smoking 2 PPD. Nicotine patches caused severe skin irritation.     Cardiac Rehabilitation Eligibility Assessment: He is ready for cardiac rehab.         Medication Adjustments/Labs and Tests Ordered: Current medicines are reviewed at length with the patient today.  Concerns regarding medicines are outlined above.  No orders of the defined types were placed in this encounter.  No orders of the defined types were placed in this encounter.   Patient Instructions  Medication  Instructions:  Stop Plavix 12/28/21  *If you need a refill on your cardiac medications before your next appointment, please call your pharmacy*   Lab Work: none If you have labs (blood work) drawn today and your tests are completely normal, you will receive your results only by: [place] (if you have MyChart) OR A paper copy in the mail If you have any lab test that is abnormal or we need to change your treatment, we will call you to review the results.   Testing/Procedures: none   Follow-Up: At [facility], you and your health needs are our priority.  As part of our continuing mission to provide you with exceptional heart care, we have created designated Provider Care Teams.  These Care Teams include your primary Cardiologist (physician) and Advanced Practice Providers (APPs -  Physician Assistants and Nurse Practitioners) who all work together to provide you with the care you need, when you need it.  We recommend signing up for the patient portal called "MyChart".  Sign up information is provided on this After Visit Summary.  MyChart is used to connect with patients for Virtual Visits (Telemedicine).  Patients are able to view lab/test results, encounter notes, upcoming appointments, etc.  Non-urgent messages can be sent to your provider as well.   To learn more about what you can do with MyChart, go to [email or web address].    Your next appointment:   4 month(s)  The format for your next appointment:   In Person  Provider:   Dr. Domenic Polite in 3-4 months    Other Instructions    Signed, Angelena Form, PA-C  07/27/2021 2:42 PM    [place]

## 2021-07-27 ENCOUNTER — Other Ambulatory Visit: Payer: Self-pay

## 2021-07-27 ENCOUNTER — Ambulatory Visit (HOSPITAL_COMMUNITY): Payer: Medicare Other | Attending: Cardiovascular Disease

## 2021-07-27 ENCOUNTER — Encounter: Payer: Self-pay | Admitting: Physician Assistant

## 2021-07-27 ENCOUNTER — Ambulatory Visit: Payer: Medicare Other | Admitting: Physician Assistant

## 2021-07-27 VITALS — BP 126/74 | HR 85 | Ht 73.0 in | Wt 132.0 lb

## 2021-07-27 DIAGNOSIS — Z952 Presence of prosthetic heart valve: Secondary | ICD-10-CM | POA: Diagnosis not present

## 2021-07-27 DIAGNOSIS — I10 Essential (primary) hypertension: Secondary | ICD-10-CM

## 2021-07-27 DIAGNOSIS — R935 Abnormal findings on diagnostic imaging of other abdominal regions, including retroperitoneum: Secondary | ICD-10-CM

## 2021-07-27 DIAGNOSIS — S40021D Contusion of right upper arm, subsequent encounter: Secondary | ICD-10-CM

## 2021-07-27 DIAGNOSIS — Z720 Tobacco use: Secondary | ICD-10-CM

## 2021-07-27 DIAGNOSIS — I739 Peripheral vascular disease, unspecified: Secondary | ICD-10-CM | POA: Diagnosis not present

## 2021-07-27 DIAGNOSIS — E875 Hyperkalemia: Secondary | ICD-10-CM

## 2021-07-27 DIAGNOSIS — S40021A Contusion of right upper arm, initial encounter: Secondary | ICD-10-CM

## 2021-07-27 LAB — ECHOCARDIOGRAM COMPLETE
AR max vel: 1.24 cm2
AV Area VTI: 1.25 cm2
AV Area mean vel: 1.22 cm2
AV Mean grad: 9 mmHg
AV Peak grad: 18 mmHg
Ao pk vel: 2.12 m/s
Area-P 1/2: 2.29 cm2
Height: 73 in
S' Lateral: 3.2 cm
Weight: 2112 oz

## 2021-07-27 NOTE — Patient Instructions (Addendum)
Medication Instructions:  Stop Plavix 12/28/21  *If you need a refill on your cardiac medications before your next appointment, please call your pharmacy*   Lab Work: none If you have labs (blood work) drawn today and your tests are completely normal, you will receive your results only by: [place] (if you have MyChart) OR A paper copy in the mail If you have any lab test that is abnormal or we need to change your treatment, we will call you to review the results.   Testing/Procedures: none   Follow-Up: At [facility], you and your health needs are our priority.  As part of our continuing mission to provide you with exceptional heart care, we have created designated Provider Care Teams.  These Care Teams include your primary Cardiologist (physician) and Advanced Practice Providers (APPs -  Physician Assistants and Nurse Practitioners) who all work together to provide you with the care you need, when you need it.  We recommend signing up for the patient portal called "MyChart".  Sign up information is provided on this After Visit Summary.  MyChart is used to connect with patients for Virtual Visits (Telemedicine).  Patients are able to view lab/test results, encounter notes, upcoming appointments, etc.  Non-urgent messages can be sent to your provider as well.   To learn more about what you can do with MyChart, go to [email or web address].    Your next appointment:   4 month(s)  The format for your next appointment:   In Person  Provider:   Dr. Domenic Polite in 3-4 months    Other Instructions

## 2021-08-08 ENCOUNTER — Telehealth: Payer: Self-pay | Admitting: Physician Assistant

## 2021-08-08 DIAGNOSIS — J449 Chronic obstructive pulmonary disease, unspecified: Secondary | ICD-10-CM

## 2021-08-08 DIAGNOSIS — R0600 Dyspnea, unspecified: Secondary | ICD-10-CM

## 2021-08-08 NOTE — Telephone Encounter (Signed)
  [facility]   MULTIDISCIPLINARY HEART VALVE TEAM   Pt called into the office to discuss his echo results which showed two jets of mild PVL. He is concerned because he is not feeling better as he had hoped after TAVR and wonders if this could be related. He continues to smoke >1 PPD. He is not having any heart failure type symptoms. He mostly has dyspnea on exertion. I think his dyspnea is likely more pulm related. He is open to a referral to pulm, which I have placed.   Angelena Form PA-C  [facility]

## 2021-09-25 ENCOUNTER — Other Ambulatory Visit: Payer: Self-pay | Admitting: Physician Assistant

## 2021-09-25 MED ORDER — CLOPIDOGREL BISULFATE 75 MG PO TABS: 75.0000 mg | ORAL_TABLET | Freq: Every day | ORAL | 0 refills | Status: DC

## 2021-09-25 MED ORDER — PANTOPRAZOLE SODIUM 40 MG PO TBEC: 40.0000 mg | DELAYED_RELEASE_TABLET | Freq: Every day | ORAL | 0 refills | Status: DC

## 2021-09-28 ENCOUNTER — Encounter (HOSPITAL_COMMUNITY): Admission: RE | Admit: 2021-09-28 | Payer: Medicare Other | Source: Ambulatory Visit

## 2021-10-03 ENCOUNTER — Encounter (HOSPITAL_COMMUNITY): Admission: RE | Admit: 2021-10-03 | Payer: Medicare Other | Source: Ambulatory Visit

## 2021-10-24 ENCOUNTER — Encounter: Payer: Self-pay | Admitting: Cardiovascular Disease

## 2021-11-21 ENCOUNTER — Telehealth: Payer: Self-pay | Admitting: Physician Assistant

## 2021-11-21 NOTE — Telephone Encounter (Signed)
Left message to call back  

## 2021-11-21 NOTE — Telephone Encounter (Signed)
Called and spoke to patient about [facility] referral put into Epic in July 2022. Patient stated he was blowing it off and did not want the referral anymore as he felt fine and did not think it was necessary. I told patient I would let Bonney Leitz know and we would cancel it. Before I got off the phone he said he was referred for Cardiac rehab through [facility] and it was all set up for him to go. Patrick Beck said he had been told there would be no charge for the rehab when first scheduling. The day before the orientation for the appt he was called and told by someone else that there was indeed a cost of his deductible($2300) and he would have a copay for every visit. Patient said he could not afford this cost so was not going to be able to do this. Patrick Beck would like to know if there are any cost effective alternatives or just any at all that he could do instead. He requests a phone call @ [phone].

## 2021-11-27 NOTE — Telephone Encounter (Signed)
I spoke with the pt and he is already involved with [employer].  I made him aware that he should continue with this program.  The pt also asked what he should be doing from a cardiac stand point with his diet.  The pt is a diabetic and he already limits carbohydrates and eats protein. I advised that he should avoid foods that are high in sodium and avoid adding salt to his foods.

## 2022-02-26 ENCOUNTER — Other Ambulatory Visit: Payer: Self-pay | Admitting: Physician Assistant

## 2022-02-26 DIAGNOSIS — Z952 Presence of prosthetic heart valve: Secondary | ICD-10-CM

## 2022-03-21 ENCOUNTER — Encounter (HOSPITAL_COMMUNITY): Payer: Self-pay

## 2022-03-21 ENCOUNTER — Emergency Department (HOSPITAL_COMMUNITY): Payer: Medicare Other

## 2022-03-21 ENCOUNTER — Emergency Department (HOSPITAL_COMMUNITY)
Admission: EM | Admit: 2022-03-21 | Discharge: 2022-03-21 | Disposition: A | Discharge status: Home / Self Care | Payer: Medicare Other | Attending: Emergency Medicine | Admitting: Emergency Medicine

## 2022-03-21 ENCOUNTER — Other Ambulatory Visit: Payer: Self-pay

## 2022-03-21 DIAGNOSIS — J449 Chronic obstructive pulmonary disease, unspecified: Secondary | ICD-10-CM | POA: Insufficient documentation

## 2022-03-21 DIAGNOSIS — Z79899 Other long term (current) drug therapy: Secondary | ICD-10-CM | POA: Insufficient documentation

## 2022-03-21 DIAGNOSIS — R1013 Epigastric pain: Secondary | ICD-10-CM | POA: Diagnosis not present

## 2022-03-21 DIAGNOSIS — I10 Essential (primary) hypertension: Secondary | ICD-10-CM | POA: Insufficient documentation

## 2022-03-21 DIAGNOSIS — Z7901 Long term (current) use of anticoagulants: Secondary | ICD-10-CM | POA: Diagnosis not present

## 2022-03-21 DIAGNOSIS — R0789 Other chest pain: Secondary | ICD-10-CM | POA: Insufficient documentation

## 2022-03-21 DIAGNOSIS — Z7982 Long term (current) use of aspirin: Secondary | ICD-10-CM | POA: Diagnosis not present

## 2022-03-21 LAB — COMPREHENSIVE METABOLIC PANEL
ALT: 29 U/L (ref 0–44)
AST: 19 U/L (ref 15–41)
Albumin: 3.1 g/dL — ABNORMAL LOW (ref 3.5–5.0)
Alkaline Phosphatase: 82 U/L (ref 38–126)
Anion gap: 12 (ref 5–15)
BUN: 23 mg/dL (ref 8–23)
CO2: 27 mmol/L (ref 22–32)
Calcium: 8.6 mg/dL — ABNORMAL LOW (ref 8.9–10.3)
Chloride: 96 mmol/L — ABNORMAL LOW (ref 98–111)
Creatinine, Ser: 1.33 mg/dL — ABNORMAL HIGH (ref 0.61–1.24)
GFR, Estimated: 56 mL/min — ABNORMAL LOW (ref >60)
Glucose, Bld: 197 mg/dL — ABNORMAL HIGH (ref 70–99)
Potassium: 3.6 mmol/L (ref 3.5–5.1)
Sodium: 135 mmol/L (ref 135–145)
Total Bilirubin: 0.5 mg/dL (ref 0.3–1.2)
Total Protein: 6.2 g/dL — ABNORMAL LOW (ref 6.5–8.1)

## 2022-03-21 LAB — URINALYSIS, ROUTINE W REFLEX MICROSCOPIC
Bilirubin Urine: NEGATIVE
Glucose, UA: 50 mg/dL — AB
Hgb urine dipstick: NEGATIVE
Ketones, ur: NEGATIVE mg/dL
Leukocytes,Ua: NEGATIVE
Nitrite: NEGATIVE
Protein, ur: NEGATIVE mg/dL
Specific Gravity, Urine: 1.02 (ref 1.005–1.030)
pH: 5 (ref 5.0–8.0)

## 2022-03-21 LAB — CBC WITH DIFFERENTIAL/PLATELET
Abs Immature Granulocytes: 0.06 10*3/uL (ref 0.00–0.07)
Basophils Absolute: 0 10*3/uL (ref 0.0–0.1)
Basophils Relative: 0 %
Eosinophils Absolute: 0 10*3/uL (ref 0.0–0.5)
Eosinophils Relative: 0 %
HCT: 39.4 % (ref 39.0–52.0)
Hemoglobin: 12.8 g/dL — ABNORMAL LOW (ref 13.0–17.0)
Immature Granulocytes: 0 %
Lymphocytes Relative: 6 %
Lymphs Abs: 0.8 10*3/uL (ref 0.7–4.0)
MCH: 32.7 pg (ref 26.0–34.0)
MCHC: 32.5 g/dL (ref 30.0–36.0)
MCV: 100.5 fL — ABNORMAL HIGH (ref 80.0–100.0)
Monocytes Absolute: 0.8 10*3/uL (ref 0.1–1.0)
Monocytes Relative: 6 %
Neutro Abs: 12.4 10*3/uL — ABNORMAL HIGH (ref 1.7–7.7)
Neutrophils Relative %: 88 %
Platelets: 214 10*3/uL (ref 150–400)
RBC: 3.92 MIL/uL — ABNORMAL LOW (ref 4.22–5.81)
RDW: 14.2 % (ref 11.5–15.5)
WBC: 14.2 10*3/uL — ABNORMAL HIGH (ref 4.0–10.5)
nRBC: 0 % (ref 0.0–0.2)

## 2022-03-21 LAB — TROPONIN I (HIGH SENSITIVITY)
Troponin I (High Sensitivity): 10 ng/L (ref <18)
Troponin I (High Sensitivity): 10 ng/L (ref <18)

## 2022-03-21 LAB — LIPASE, BLOOD: Lipase: 22 U/L (ref 11–51)

## 2022-03-21 MED ORDER — FAMOTIDINE IN NACL 20-0.9 MG/50ML-% IV SOLN
20.0000 mg | Freq: Once | INTRAVENOUS | Status: AC
  Administered 2022-03-21: 20 mg via INTRAVENOUS
  Filled 2022-03-21: qty 50

## 2022-03-21 MED ORDER — FAMOTIDINE 20 MG PO TABS: 20.0000 mg | ORAL_TABLET | Freq: Two times a day (BID) | ORAL | 0 refills | Status: DC

## 2022-03-21 MED ORDER — IOHEXOL 300 MG/ML  SOLN
75.0000 mL | Freq: Once | INTRAMUSCULAR | Status: AC | PRN
Start: 2022-03-21 — End: 2022-03-21
  Administered 2022-03-21: 75 mL via INTRAVENOUS

## 2022-03-21 MED ORDER — SODIUM CHLORIDE 0.9 % IV BOLUS
1000.0000 mL | Freq: Once | INTRAVENOUS | Status: AC
  Administered 2022-03-21: 1000 mL via INTRAVENOUS

## 2022-03-21 MED ORDER — FENTANYL CITRATE PF 50 MCG/ML IJ SOSY
50.0000 ug | PREFILLED_SYRINGE | Freq: Once | INTRAMUSCULAR | Status: AC
  Administered 2022-03-21: 50 ug via INTRAVENOUS
  Filled 2022-03-21: qty 1

## 2022-03-21 MED ORDER — SODIUM CHLORIDE 0.9 % IV SOLN: INTRAVENOUS | Status: DC

## 2022-03-21 MED ORDER — ONDANSETRON HCL 4 MG/2ML IJ SOLN
4.0000 mg | Freq: Once | INTRAMUSCULAR | Status: AC
  Administered 2022-03-21: 4 mg via INTRAVENOUS
  Filled 2022-03-21: qty 2

## 2022-03-21 MED ORDER — SUCRALFATE 1 GM/10ML PO SUSP
1.0000 g | Freq: Once | ORAL | Status: AC
  Administered 2022-03-21: 1 g via ORAL
  Filled 2022-03-21: qty 10

## 2022-03-21 MED ORDER — SUCRALFATE 1 G PO TABS: 1.0000 g | ORAL_TABLET | Freq: Three times a day (TID) | ORAL | 0 refills | Status: DC

## 2022-03-21 NOTE — Discharge Instructions (Addendum)
As discussed, today's evaluation has largely indicated that inflammation or irritation of your stomach is contributing to your discomfort.  It is importantly follow-up with your primary care physician and our gastroenterology colleagues.  Please obtain and take all medication as directed.  Monitor your condition carefully and do not hesitate to return here for concerning changes in your condition. ? ?Below is the interpretation of today's CT scan: ?IMPRESSION:  ?1. Prominence of the gastric antrum, duodenal bulb and proximal  ?duodenum with the duodenum measuring up to 4.9 cm in diameter and  ?mild narrowing of the duodenum behind the SMV and SMA but with  ?fluid-filled loops of small bowel and mild wall thickening  ?throughout the remainder of the abdomen as well as decompressed  ?gastric fundus and cardia. Findings most consistent with enteritis  ?without convincing evidence of mechanical obstruction.  ?2. Similar fold thickening of the nondistended portions of the  ?stomach which again may reflect gastritis, as well the possibility  ?of infiltrative gastric neoplasm is again not entirely excluded.  ?Consider further evaluation with nonemergent endoscopy if not  ?previously performed.  ?3. Sequela of chronic pancreatitis without definite evidence of  ?acute pancreatitis.  ?4. Unchanged size of the infrarenal abdominal aortic aneurysm  ?measuring 3.0 cm. Recommend follow-up every 3 years. Reference: J Am  ?Coll Radiol 2025;42:706-237.  ?5. 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  ?6. 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  ?7. Aortic Atherosclerosis (ICD10-I70.0).  ? ? ?

## 2022-03-21 NOTE — ED Triage Notes (Signed)
Patient with complaints of epigastric pain that started the previous day now having central chest pain.  ?

## 2022-03-21 NOTE — ED Provider Notes (Signed)
?[facility] ?Provider Note ? ? ?CSN: [id number] ?Arrival date & time: 03/21/22  0807 ? ?  ? ?History ? ?Chief Complaint  ?Patient presents with  ? Chest Pain  ? ? ?Patrick Beck is a 76 y.o. male. ? ?HPI ?Patient presents with his wife who assists with history.  He presents with concern for 2 days of epigastric pain.  The pain is severe, radiating bilaterally, circumferentially, and is now ascending into the thorax.  No dyspnea, no fever, no vomiting, though there is nausea and anorexia. ?Patient does not drink alcohol, has not for about 22 years, but he is a current smoker. ?No clear precipitating, alleviating, or exacerbating factors.  He has had no relief with ibuprofen, Tylenol, Percocet. ?  ? ?Home Medications ?Prior to Admission medications   ?Medication Sig Start Date End Date Taking? Authorizing Provider  ?ACCU-CHEK GUIDE test strip SMARTSIG:Via Meter 4-5 Times Daily 06/18/21   [provider]  ?aspirin 81 MG chewable tablet Chew 1 tablet (81 mg total) by mouth daily. Can swallow whole 06/28/21   Eileen Stanford, PA-C  ?brimonidine (ALPHAGAN) 0.2 % ophthalmic solution Place 1 drop into both eyes 3 (three) times daily. 05/23/20   [provider]  ?buPROPion (WELLBUTRIN SR) 150 MG 12 hr tablet Take 150 mg by mouth in the morning and at bedtime. 08/24/21   [provider]  ?clopidogrel (PLAVIX) 75 MG tablet Take 1 tablet (75 mg total) by mouth daily with breakfast. 09/25/21   Eileen Stanford, PA-C  ?diphenhydrAMINE (BENADRYL) 25 MG tablet Take 25 mg by mouth daily as needed for allergies.    [provider]  ?glimepiride (AMARYL) 1 MG tablet Take 0.5 mg by mouth daily with breakfast.    [provider]  ?hydrochlorothiazide (HYDRODIURIL) 12.5 MG tablet Take 12.5 mg by mouth daily. 02/12/21   [provider]  ?hydrocortisone cream 1 % Apply 1 application topically 2 (two) times daily as needed (bug bites).    [provider]   ?latanoprost (XALATAN) 0.005 % ophthalmic solution Place 1 drop into both eyes at bedtime. 10/14/17   [provider]  ?Melatonin 10 MG CAPS Take 20-30 mg by mouth at bedtime.    [provider]  ?metFORMIN (GLUCOPHAGE) 1000 MG tablet Take 1,000 mg by mouth 2 (two) times daily with a meal.    [provider]  ?neomycin-bacitracin-polymyxin (NEOSPORIN) ointment Apply 1 application topically as needed for wound care.    [provider]  ?pantoprazole (PROTONIX) 40 MG tablet Take 1 tablet (40 mg total) by mouth daily. 09/25/21   Eileen Stanford, PA-C  ?pravastatin (PRAVACHOL) 40 MG tablet Take 40 mg by mouth every evening.    [provider]  ?timolol (TIMOPTIC) 0.5 % ophthalmic solution Place 1 drop into both eyes daily. 08/16/21   [provider]  ?   ? ?Allergies    ?Macadamia nut oil   ? ?Review of Systems   ?Review of Systems  ?Constitutional:   ?     Per HPI, otherwise negative  ?HENT:    ?     Per HPI, otherwise negative  ?Respiratory:    ?     Per HPI, otherwise negative  ?Cardiovascular:   ?     Per HPI, otherwise negative  ?Gastrointestinal:  Negative for vomiting.  ?Endocrine:  ?     Negative aside from HPI  ?Genitourinary:   ?     Neg aside from HPI   ?Musculoskeletal:   ?  Per HPI, otherwise negative  ?Skin: Negative.   ?Neurological:  Negative for syncope.  ? ?Physical Exam ?Updated Vital Signs ?BP 124/69 (BP Location: Right Arm)   Pulse (!) 105   Temp 98.9 ?F (37.2 ?C) (Oral)   Resp 20   Ht '6\' 1"'$  (1.854 m)   Wt 59 kg   SpO2 98%   BMI 17.15 kg/m?  ?Physical Exam ?Vitals and nursing note reviewed.  ?Constitutional:   ?   General: He is not in acute distress. ?   Comments: Sickly appearing elderly male  ?HENT:  ?   Head: Normocephalic and atraumatic.  ?Eyes:  ?   Conjunctiva/sclera: Conjunctivae normal.  ?Cardiovascular:  ?   Rate and Rhythm: Normal rate and regular rhythm.  ?Pulmonary:  ?   Effort: Pulmonary effort is normal. No respiratory  distress.  ?   Breath sounds: No stridor.  ?Abdominal:  ?   General: There is no distension.  ?   Tenderness: There is abdominal tenderness in the epigastric area.  ?Skin: ?   General: Skin is warm and dry.  ?Neurological:  ?   Mental Status: He is alert and oriented to person, place, and time.  ? ? ?ED Results / Procedures / Treatments   ?Labs ?(all labs ordered are listed, but only abnormal results are displayed) ?Labs Reviewed  ?COMPREHENSIVE METABOLIC PANEL  ?LIPASE, BLOOD  ?CBC WITH DIFFERENTIAL/PLATELET  ?URINALYSIS, ROUTINE W REFLEX MICROSCOPIC  ?TROPONIN I (HIGH SENSITIVITY)  ? ? ?EKG ?None ? ?Radiology ?No results found. ? ?Procedures ?Procedures  ? ? ?Medications Ordered in ED ?Medications  ?fentaNYL (SUBLIMAZE) injection 50 mcg (has no administration in time range)  ?ondansetron (ZOFRAN) injection 4 mg (has no administration in time range)  ?sodium chloride 0.9 % bolus 1,000 mL (has no administration in time range)  ?  And  ?0.9 %  sodium chloride infusion (has no administration in time range)  ? ? ?ED Course/ Medical Decision Making/ A&P ? This patient with a Hx of COPD, hypertension, aortic stenosis  with chest pain, get presents to the ED for concern of chest pain, epigastric pain, this involves an extensive number of treatment options, and is a complaint that carries with it a high risk of complications and morbidity.   ? ?The differential diagnosis includes pancreatitis, gastritis, atypical ACS, aortic disruption ? ? ?Social Determinants of Health: ? ?Smoking, age ? ?Additional history obtained: ? ?Additional history and/or information obtained from wife, notable for time course, 2 days, no relief with OTC or narcotic medication ? ? ?After the initial evaluation, orders, including: Labs, CT, x-ray, EKG were initiated. ? ? ?Patient placed on Cardiac and Pulse-Oximetry Monitors. ?The patient was maintained on a cardiac monitor.  The cardiac monitored showed an rhythm of 110 sinus tach abnormal ?The  patient was also maintained on pulse oximetry. The readings were typically 99% room air normal ? ? ?On repeat evaluation of the patient improved ?2:58 PM ?He is sitting up at the edge of the bed, speaking clearly, appears much more comfortable, states that he feels somewhat better ?Lab Tests: ? ?I personally interpreted labs.  The pertinent results include: 2 normal troponin, reassuring for low suspicion of atypical ACS, labs otherwise unremarkable, though lipase value was normal ? ?Imaging Studies ordered: ? ?I independently visualized and interpreted imaging which showed substantial inflammation in the stomach proper, suggesting gastritis versus possible malignancy.  No evidence for obstruction, diverticulitis, colitis ?I agree with the radiologist interpretation ? ? ?Dispostion / Final MDM: ? ?  After consideration of the diagnostic results and the patient's response to treatment, given his improvement here we will continue meds, follow-up as an outpatient with GI for anticipated EGD.  No evidence for atypical ACS, pneumonia, bacteremia, sepsis.  Some suspicion for gastric or malignant etiology for his pain.  Patient notes that he cannot do this follow-up, was discharged in stable condition.  Hospitalist considered given the patient's risk profile, but given his improvement, hemodynamic stability, discharge is reasonable. ? ?Final Clinical Impression(s) / ED Diagnoses ?Final diagnoses:  ?Atypical chest pain  ?Epigastric pain  ? ? ?Rx / DC Orders ?ED Discharge Orders   ? ?      Ordered  ?  sucralfate (CARAFATE) 1 g tablet  3 times daily with meals & bedtime       ?Note to Pharmacy: Take for one week  ? 03/21/22 1502  ?  famotidine (PEPCID) 20 MG tablet  2 times daily       ? 03/21/22 1502  ? ?  ?  ? ?  ? ? ?  ?Carmin Muskrat, MD ?03/21/22 1502 ? ?

## 2022-03-22 ENCOUNTER — Other Ambulatory Visit (HOSPITAL_COMMUNITY)
Admission: RE | Admit: 2022-03-22 | Discharge: 2022-03-22 | Disposition: A | Discharge status: Home / Self Care | Payer: Medicare Other | Source: Ambulatory Visit | Attending: Family Medicine | Admitting: Family Medicine

## 2022-03-22 DIAGNOSIS — M791 Myalgia, unspecified site: Secondary | ICD-10-CM | POA: Insufficient documentation

## 2022-03-22 LAB — C-REACTIVE PROTEIN: CRP: 19.7 mg/dL — ABNORMAL HIGH (ref <1.0)

## 2022-03-22 LAB — CREATININE, SERUM
Creatinine, Ser: 1.07 mg/dL (ref 0.61–1.24)
GFR, Estimated: >60 mL/min (ref >60)

## 2022-03-23 ENCOUNTER — Other Ambulatory Visit: Payer: Self-pay

## 2022-03-23 ENCOUNTER — Emergency Department (HOSPITAL_COMMUNITY)
Admission: EM | Admit: 2022-03-23 | Discharge: 2022-03-23 | Disposition: A | Discharge status: Home / Self Care | Payer: Medicare Other | Attending: Emergency Medicine | Admitting: Emergency Medicine

## 2022-03-23 ENCOUNTER — Encounter (HOSPITAL_COMMUNITY): Payer: Self-pay

## 2022-03-23 DIAGNOSIS — R1010 Upper abdominal pain, unspecified: Secondary | ICD-10-CM

## 2022-03-23 DIAGNOSIS — R1013 Epigastric pain: Secondary | ICD-10-CM | POA: Diagnosis present

## 2022-03-23 DIAGNOSIS — Z7982 Long term (current) use of aspirin: Secondary | ICD-10-CM | POA: Diagnosis not present

## 2022-03-23 DIAGNOSIS — Z79899 Other long term (current) drug therapy: Secondary | ICD-10-CM | POA: Insufficient documentation

## 2022-03-23 DIAGNOSIS — R1084 Generalized abdominal pain: Secondary | ICD-10-CM | POA: Insufficient documentation

## 2022-03-23 DIAGNOSIS — I10 Essential (primary) hypertension: Secondary | ICD-10-CM | POA: Diagnosis not present

## 2022-03-23 LAB — CBC WITH DIFFERENTIAL/PLATELET
Abs Immature Granulocytes: 0.03 10*3/uL (ref 0.00–0.07)
Basophils Absolute: 0 10*3/uL (ref 0.0–0.1)
Basophils Relative: 0 %
Eosinophils Absolute: 0 10*3/uL (ref 0.0–0.5)
Eosinophils Relative: 1 %
HCT: 38.1 % — ABNORMAL LOW (ref 39.0–52.0)
Hemoglobin: 11.8 g/dL — ABNORMAL LOW (ref 13.0–17.0)
Immature Granulocytes: 0 %
Lymphocytes Relative: 11 %
Lymphs Abs: 1 10*3/uL (ref 0.7–4.0)
MCH: 31.3 pg (ref 26.0–34.0)
MCHC: 31 g/dL (ref 30.0–36.0)
MCV: 101.1 fL — ABNORMAL HIGH (ref 80.0–100.0)
Monocytes Absolute: 0.7 10*3/uL (ref 0.1–1.0)
Monocytes Relative: 8 %
Neutro Abs: 7.1 10*3/uL (ref 1.7–7.7)
Neutrophils Relative %: 80 %
Platelets: 192 10*3/uL (ref 150–400)
RBC: 3.77 MIL/uL — ABNORMAL LOW (ref 4.22–5.81)
RDW: 14 % (ref 11.5–15.5)
WBC: 8.8 10*3/uL (ref 4.0–10.5)
nRBC: 0 % (ref 0.0–0.2)

## 2022-03-23 LAB — COMPREHENSIVE METABOLIC PANEL
ALT: 19 U/L (ref 0–44)
AST: 16 U/L (ref 15–41)
Albumin: 2.8 g/dL — ABNORMAL LOW (ref 3.5–5.0)
Alkaline Phosphatase: 116 U/L (ref 38–126)
Anion gap: 7 (ref 5–15)
BUN: 23 mg/dL (ref 8–23)
CO2: 26 mmol/L (ref 22–32)
Calcium: 8.6 mg/dL — ABNORMAL LOW (ref 8.9–10.3)
Chloride: 103 mmol/L (ref 98–111)
Creatinine, Ser: 1.04 mg/dL (ref 0.61–1.24)
GFR, Estimated: >60 mL/min (ref >60)
Glucose, Bld: 161 mg/dL — ABNORMAL HIGH (ref 70–99)
Potassium: 3.6 mmol/L (ref 3.5–5.1)
Sodium: 136 mmol/L (ref 135–145)
Total Bilirubin: 0.5 mg/dL (ref 0.3–1.2)
Total Protein: 5.7 g/dL — ABNORMAL LOW (ref 6.5–8.1)

## 2022-03-23 LAB — ALDOLASE: Aldolase: 6.4 U/L (ref 3.3–10.3)

## 2022-03-23 LAB — LIPASE, BLOOD: Lipase: 19 U/L (ref 11–51)

## 2022-03-23 MED ORDER — PANTOPRAZOLE SODIUM 40 MG IV SOLR
40.0000 mg | Freq: Once | INTRAVENOUS | Status: AC
Start: 2022-03-23 — End: 2022-03-23
  Administered 2022-03-23: 40 mg via INTRAVENOUS
  Filled 2022-03-23: qty 10

## 2022-03-23 MED ORDER — HYDROMORPHONE HCL 1 MG/ML IJ SOLN
0.5000 mg | Freq: Once | INTRAMUSCULAR | Status: AC
  Administered 2022-03-23: 0.5 mg via INTRAVENOUS
  Filled 2022-03-23: qty 1

## 2022-03-23 MED ORDER — SODIUM CHLORIDE 0.9 % IV BOLUS
500.0000 mL | Freq: Once | INTRAVENOUS | Status: AC
  Administered 2022-03-23: 500 mL via INTRAVENOUS

## 2022-03-23 MED ORDER — ONDANSETRON HCL 4 MG/2ML IJ SOLN
4.0000 mg | Freq: Once | INTRAMUSCULAR | Status: AC
  Administered 2022-03-23: 4 mg via INTRAVENOUS
  Filled 2022-03-23: qty 2

## 2022-03-23 NOTE — Discharge Instructions (Signed)
Call Monday to get an appointment with Dr. Jenetta Downer.  Increase your Protonix to twice a day and return if any problems ?

## 2022-03-23 NOTE — ED Provider Notes (Signed)
?[facility] ?Provider Note ? ? ?CSN: [id number] ?Arrival date & time: 03/23/22  1225 ? ?  ? ?History ? ?Chief Complaint  ?Patient presents with  ? Abdominal Pain  ? ? ?Patrick Beck is a 76 y.o. male. ? ?Patient complains of epigastric pain.  He was seen here 2 days ago.  His primary care doctor sent him back today.  Patient with past medical history of hypertension ? ?The history is provided by the patient and medical records. No language interpreter was used.  ?Abdominal Pain ?Pain location:  Generalized ?Pain quality: aching   ?Pain radiates to:  Does not radiate ?Pain severity:  Moderate ?Onset quality:  Sudden ?Timing:  Constant ?Progression:  Worsening ?Chronicity:  Recurrent ?Context: not medication withdrawal   ?Relieved by:  Nothing ?Associated symptoms: no chest pain, no cough, no diarrhea, no fatigue and no hematuria   ? ?  ? ?Home Medications ?Prior to Admission medications   ?Medication Sig Start Date End Date Taking? Authorizing Provider  ?ACCU-CHEK GUIDE test strip SMARTSIG:Via Meter 4-5 Times Daily 06/18/21   [provider]  ?aspirin 81 MG chewable tablet Chew 1 tablet (81 mg total) by mouth daily. Can swallow whole 06/28/21   Eileen Stanford, PA-C  ?brimonidine (ALPHAGAN) 0.2 % ophthalmic solution Place 1 drop into both eyes 3 (three) times daily. 05/23/20   [provider]  ?buPROPion (WELLBUTRIN SR) 150 MG 12 hr tablet Take 150 mg by mouth in the morning and at bedtime. 08/24/21   [provider]  ?clopidogrel (PLAVIX) 75 MG tablet Take 1 tablet (75 mg total) by mouth daily with breakfast. 09/25/21   Eileen Stanford, PA-C  ?diphenhydrAMINE (BENADRYL) 25 MG tablet Take 25 mg by mouth daily as needed for allergies.    [provider]  ?famotidine (PEPCID) 20 MG tablet Take 1 tablet (20 mg total) by mouth 2 (two) times daily. 03/21/22   Carmin Muskrat, MD  ?FARXIGA 5 MG TABS tablet Take 5 mg by mouth daily. 03/13/22   [provider]   ?furosemide (LASIX) 40 MG tablet Take 40 mg by mouth daily as needed. 01/04/22   [provider]  ?hydrochlorothiazide (HYDRODIURIL) 12.5 MG tablet Take 12.5 mg by mouth daily. 02/12/21   [provider]  ?hydrochlorothiazide (HYDRODIURIL) 25 MG tablet Take 25 mg by mouth daily. 03/04/22   [provider]  ?hydrocortisone cream 1 % Apply 1 application topically 2 (two) times daily as needed (bug bites).    [provider]  ?latanoprost (XALATAN) 0.005 % ophthalmic solution Place 1 drop into both eyes at bedtime. 10/14/17   [provider]  ?Melatonin 10 MG CAPS Take 20-30 mg by mouth at bedtime.    [provider]  ?metFORMIN (GLUCOPHAGE) 1000 MG tablet Take 1,000 mg by mouth 2 (two) times daily with a meal.    [provider]  ?neomycin-bacitracin-polymyxin (NEOSPORIN) ointment Apply 1 application topically as needed for wound care.    [provider]  ?pantoprazole (PROTONIX) 40 MG tablet Take 1 tablet (40 mg total) by mouth daily. 09/25/21   Eileen Stanford, PA-C  ?pravastatin (PRAVACHOL) 40 MG tablet Take 40 mg by mouth every evening.    [provider]  ?sucralfate (CARAFATE) 1 g tablet Take 1 tablet (1 g total) by mouth 4 (four) times daily -  with meals and at bedtime. 03/21/22   Carmin Muskrat, MD  ?timolol (TIMOPTIC) 0.5 % ophthalmic solution Place 1 drop into both eyes daily.  08/16/21   [provider]  ?   ? ?Allergies    ?Macadamia nut oil   ? ?Review of Systems   ?Review of Systems  ?Constitutional:  Negative for appetite change and fatigue.  ?HENT:  Negative for congestion, ear discharge and sinus pressure.   ?Eyes:  Negative for discharge.  ?Respiratory:  Negative for cough.   ?Cardiovascular:  Negative for chest pain.  ?Gastrointestinal:  Positive for abdominal pain. Negative for diarrhea.  ?Genitourinary:  Negative for frequency and hematuria.  ?Musculoskeletal:  Negative for back pain.  ?Skin:  Negative for  rash.  ?Neurological:  Negative for seizures and headaches.  ?Psychiatric/Behavioral:  Negative for hallucinations.   ? ?Physical Exam ?Updated Vital Signs ?BP (!) 109/57   Pulse 65   Temp 98.4 ?F (36.9 ?C) (Oral)   Resp 20   Ht '6\' 1"'$  (1.854 m)   Wt 59 kg   SpO2 91%   BMI 17.15 kg/m?  ?Physical Exam ?Vitals and nursing note reviewed.  ?Constitutional:   ?   Appearance: He is well-developed.  ?HENT:  ?   Head: Normocephalic.  ?   Nose: Nose normal.  ?Eyes:  ?   General: No scleral icterus. ?   Conjunctiva/sclera: Conjunctivae normal.  ?Neck:  ?   Thyroid: No thyromegaly.  ?Cardiovascular:  ?   Rate and Rhythm: Normal rate and regular rhythm.  ?   Heart sounds: No murmur heard. ?  No friction rub. No gallop.  ?Pulmonary:  ?   Breath sounds: No stridor. No wheezing or rales.  ?Chest:  ?   Chest wall: No tenderness.  ?Abdominal:  ?   General: There is no distension.  ?   Tenderness: There is abdominal tenderness. There is no rebound.  ?Musculoskeletal:     ?   General: Normal range of motion.  ?   Cervical back: Neck supple.  ?Lymphadenopathy:  ?   Cervical: No cervical adenopathy.  ?Skin: ?   Findings: No erythema or rash.  ?Neurological:  ?   Mental Status: He is alert and oriented to person, place, and time.  ?   Motor: No abnormal muscle tone.  ?   Coordination: Coordination normal.  ?Psychiatric:     ?   Behavior: Behavior normal.  ? ? ?ED Results / Procedures / Treatments   ?Labs ?(all labs ordered are listed, but only abnormal results are displayed) ?Labs Reviewed  ?CBC WITH DIFFERENTIAL/PLATELET - Abnormal; Notable for the following components:  ?    Result Value  ? RBC 3.77 (*)   ? Hemoglobin 11.8 (*)   ? HCT 38.1 (*)   ? MCV 101.1 (*)   ? All other components within normal limits  ?COMPREHENSIVE METABOLIC PANEL - Abnormal; Notable for the following components:  ? Glucose, Bld 161 (*)   ? Calcium 8.6 (*)   ? Total Protein 5.7 (*)   ? Albumin 2.8 (*)   ? All other components within normal limits  ?LIPASE,  BLOOD  ? ? ?EKG ?None ? ?Radiology ?No results found. ? ?Procedures ?Procedures  ? ? ?Medications Ordered in ED ?Medications  ?sodium chloride 0.9 % bolus 500 mL (0 mLs Intravenous Stopped 03/23/22 1457)  ?pantoprazole (PROTONIX) injection 40 mg (40 mg Intravenous Given 03/23/22 1309)  ?HYDROmorphone (DILAUDID) injection 0.5 mg (0.5 mg Intravenous Given 03/23/22 1309)  ?ondansetron (ZOFRAN) injection 4 mg (4 mg Intravenous Given 03/23/22 1309)  ? ? ?ED Course/ Medical Decision Making/ A&P ?  ?                        ?  Medical Decision Making ?Amount and/or Complexity of Data Reviewed ?Labs: ordered. ? ?Risk ?Prescription drug management. ? ?This patient presents to the ED for concern of abdominal pain, this involves an extensive number of treatment options, and is a complaint that carries with it a high risk of complications and morbidity.  The differential diagnosis includes peptic ulcer disease, gallbladder ? ? ?Co morbidities that complicate the patient evaluation ? ?Hypertension ? ? ?Additional history obtained: ? ?Additional history obtained from significant other ?External records from outside source obtained and reviewed including hospital record ? ? ?Lab Tests: ? ?I Ordered, and personally interpreted labs.  The pertinent results include: CBC and chemistries which showed mild anemia and mild elevated glucose ? ? ?Imaging Studies ordered: ? ?No imaging ordered because the patient had a CT of the abdomen yesterday that showed irregularities in his duodenum ? ?Cardiac Monitoring: / EKG: ? ?The patient was maintained on a cardiac monitor.  I personally viewed and interpreted the cardiac monitored which showed an underlying rhythm of: Normal sinus rhythm ? ? ?Consultations Obtained: ? ?I requested consultation with the GI,  and discussed lab and imaging findings as well as pertinent plan - they recommend: Admission ? ? ?Problem List / ED Course / Critical interventions / Medication management ? ?Abdominal pain ?I  ordered medication including Dilaudid and fluids for pain and dehydration.  Also Protonix ?Reevaluation of the patient after these medicines showed that the patient improved ?I have reviewed the patients home med

## 2022-03-23 NOTE — ED Triage Notes (Signed)
Reports right chest pain that radiated down to rlq and then across lower abdomen.  Denies n/v complains of palpitations.  ?

## 2022-03-29 ENCOUNTER — Other Ambulatory Visit: Payer: Self-pay | Admitting: Physician Assistant

## 2022-03-29 NOTE — Telephone Encounter (Signed)
Pt's pharmacy is requesting a refill on pantoprazole. Would Dr. Mcalhany like to refill this medication? Please address 

## 2022-04-17 ENCOUNTER — Encounter: Payer: Self-pay | Admitting: Internal Medicine

## 2022-05-06 NOTE — Progress Notes (Signed)
?[facility]   ?[facility] ?                                    ?Cardiology Office Note:   ? ?Date:  05/08/2022  ? ?ID:  Patrick Beck, DOB Jun 06, 1946, MRN [id number] ? ?PCP:  Lemmie Evens, MD  ?[facility] Cardiologist:  Rozann Lesches, MD  / Dr. Angelena Form & Dr. Cyndia Bent (TAVR) ?[facility] Electrophysiologist:  None  ? ?Referring MD: Lemmie Evens, MD  ? ?1 year s/p TAVR ? ?History of Present Illness:   ? ?Patrick Beck is a 76 y.o. 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  ? ?He was in his usual state of health without any physical limitations until fall 2019 when he was hospitalized in [place] for severe necrotizing pneumonia. Echocardiogram performed at that time reportedly revealed mild aortic stenosis with normal left ventricular systolic function.  The patient was seen in follow-up recently by Dr. Domenic Polite and complaining of worsening symptoms of exertional shortness of breath.  Follow-up echocardiogram performed March 15, 2021 revealed normal left ventricular function with ejection fraction estimated 60 to 65%.  The aortic valve appeared functionally bicuspid with severe calcification and probably severe aortic stenosis.  Peak velocity across aortic valve measured 3.0 m/s but the dimensionless index was notably quite low at 0.23 and aortic valve area calculated 0.72 cm? by VTI.  The patient was referred to the multidisciplinary heart valve clinic and has been evaluated previously by Dr. Angelena Form. Diagnostic cardiac catheterization performed April 18, 2021 revealed mild nonobstructive coronary artery disease normal right-sided pressures and preserved cardiac output. ?  ?He was evaluated by the multidisciplinary valve team and underwent successful TAVR with a 29 mm Edwards Sapien 3 Ultra THV via the left subclavian approach on 06/26/21. Post  operative echo showed EF 55%, normally functioning TAVR with a mean gradient of 5.5 mmHg and trivial PVL. He was discharged on aspirin and plavix. He has been on lasix and Hctz. When we tried to stop lasix, he had hyperkalemia. 1 month echo showed EF 60%, normally functioning TAVR with a mean gradient of 9 mm hg and two jets of PVL worse than on POD1 echo. This was reviewed by the team and continued surveillance was recommended.  ? ?He continued to have dyspnea which I felt was likely pulm related. ? ?Seen in ER on 03/21/22 for atypical chest pain. Labs, ECG and CT were non acute. CT did show evidence of enteritis and focal thickening again noted (seen on TAVR scans) that cannot rule out gastric neoplasm. Seen again 3/25 for abdominal pain Plan was for admission with GI consultation and work up but patient refused and planned to see GI in outpatient setting.  ? ?Today the patient presents to clinic for follow up. He sees GI 5/16. He is here with his wife. He is no longer having the abdominal pain. Has had issues with diarrhea for which he stopped taking several medications that his PCP started him on. No CP. He has chronic dyspnea. No LE edema, orthopnea or PND. No dizziness or syncope. No blood in stool or urine. No palpitations. Has trouble getting up due to weakness.  ? ? ?Past Medical History:  ?Diagnosis Date  ? Aortic atherosclerosis ([place])   ? Arthritis   ?  Carotid artery disease ([place])   ? COPD (chronic obstructive pulmonary disease) ([place])   ? Diverticulosis   ? Erectile dysfunction   ? Essential hypertension   ? GERD (gastroesophageal reflux disease)   ? Glaucoma   ? H/O hiatal hernia   ? History of hyperlipidemia   ? Neuropathy   ? PAD (peripheral artery disease) ([place])   ? Pneumonia ish  ? 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 s/p TAVR with a 29 mm Edwards Sapien THV via the subclavian approach by Dr. Angelena Form & Dr. Cyndia Bent  ? Severe aortic stenosis   ? Type 2 diabetes mellitus ([place])    ? ? ?Past Surgical History:  ?Procedure Laterality Date  ? CARDIAC CATHETERIZATION    ? COLONOSCOPY N/A 04/06/2013  ? Procedure: COLONOSCOPY;  Surgeon: Jamesetta So, MD;  [facility];  Service: Gastroenterology;  Laterality: N/A;  ? EYE SURGERY Left   ? laser sx fr glaucoma  ? HERNIA REPAIR    ? Three prior surgeries before 1966  ? MULTIPLE EXTRACTIONS WITH ALVEOLOPLASTY N/A 05/24/2021  ? Procedure: EXTRACTION OF TEETH NUMBER TWO, THREE, FIVE, SIX, SEVEN, EIGHT, NINE, TEN, ELEVEN, TWELVE, SEVENTEEN, EIGHTTEEN, NINETEEN, TWENTY, TWENTY-ONE, TWENTY- TWO, TWENTY-FOUR, THWENTY-SEVEN, TWENTY-EIGHT, TWENTY-NINE WITH ALVEOLOPLASTY IN ALL FOUR QUADRANTS;  Surgeon: [name], DMD;  Location: [facility];  Service: Dentistry;  Laterality: N/A;  ? RIGHT/LEFT HEART CATH AND CORONARY ANGIOGRAPHY N/A 04/18/2021  ? Procedure: RIGHT/LEFT HEART CATH AND CORONARY ANGIOGRAPHY;  Surgeon: Burnell Blanks, MD;  Location: [facility];  Service: Cardiovascular;  Laterality: N/A;  ? TEE WITHOUT CARDIOVERSION N/A 06/26/2021  ? Procedure: TRANSESOPHAGEAL ECHOCARDIOGRAM (TEE);  Surgeon: Burnell Blanks, MD;  Location: [facility];  Service: Open Heart Surgery;  Laterality: N/A;  ? TONSILLECTOMY    ? at age 65  ? ? ?Current Medications: ?Current Meds  ?Medication Sig  ? ACCU-CHEK GUIDE test strip SMARTSIG:Via Meter 4-5 Times Daily  ? aspirin 81 MG chewable tablet Chew 1 tablet (81 mg total) by mouth daily. Can swallow whole  ? brimonidine (ALPHAGAN) 0.2 % ophthalmic solution Place 1 drop into both eyes 3 (three) times daily.  ? diphenhydrAMINE (BENADRYL) 25 MG tablet Take 25 mg by mouth daily as needed for allergies.  ? hydrochlorothiazide (HYDRODIURIL) 12.5 MG tablet Take 12.5 mg by mouth daily.  ? hydrochlorothiazide (HYDRODIURIL) 25 MG tablet Take 25 mg by mouth daily.  ? hydrocortisone cream 1 % Apply 1 application topically 2 (two) times daily as needed (bug bites).  ? latanoprost (XALATAN) 0.005 %  ophthalmic solution Place 1 drop into both eyes at bedtime.  ? Melatonin 10 MG CAPS Take 20-30 mg by mouth at bedtime.  ? metFORMIN (GLUCOPHAGE) 1000 MG tablet Take 1,000 mg by mouth 2 (two) times daily with a meal.  ? neomycin-bacitracin-polymyxin (NEOSPORIN) ointment Apply 1 application topically as needed for wound care.  ? pantoprazole (PROTONIX) 40 MG tablet TAKE 1 TABLET BY MOUTH EVERY DAY  ? pravastatin (PRAVACHOL) 40 MG tablet Take 40 mg by mouth every evening.  ? timolol (TIMOPTIC) 0.5 % ophthalmic solution Place 1 drop into both eyes daily.  ?  ? ?Allergies:   Macadamia nut oil  ? ?Social History  ? ?Socioeconomic History  ? Marital status: Divorced  ?  Spouse name: Not on file  ? Number of children: 0  ? Years of education: Not on file  ? Highest education level: Not on file  ?Occupational History  ? Occupation: retired-designed  [employer]  ?Tobacco Use  ? Smoking status: Heavy Smoker  ?  Packs/day: 2.00  ?  Years: 56.00  ?  Pack years: 112.00  ?  Types: Cigarettes  ? Smokeless tobacco: Never  ? Tobacco comments:  ?  pt refuses hand outs   ?Vaping Use  ? Vaping Use: Never used  ?Substance and Sexual Activity  ? Alcohol use: No  ?  Comment: 07/1999  ? Drug use: No  ? Sexual activity: Not on file  ?Other Topics Concern  ? Not on file  ?Social History Narrative  ? Not on file  ? ?Social Determinants of Health  ? ?Financial Resource Strain: Not on file  ?Food Insecurity: Not on file  ?Transportation Needs: Not on file  ?Physical Activity: Not on file  ?Stress: Not on file  ?Social Connections: Not on file  ?  ? ?Family History: ?The patient's family history includes Aortic stenosis in his father; COPD in his mother. There is no history of Cancer. ? ?ROS:   ?Please see the history of present illness.    ?All other systems reviewed and are negative. ? ?EKGs/Labs/Other Studies Reviewed:   ? ?The following studies were reviewed today: ? ?TAVR OPERATIVE NOTE ?  ?  ?Date of Procedure:                06/26/2021 ?   ?Preoperative Diagnosis:      Severe Aortic Stenosis ?  ?Postoperative Diagnosis:    Same ?  ?Procedure:      ?  ?Transcatheter Aortic Valve Replacement - Left Subclavian Approach ?            Edwards Sapien

## 2022-05-08 ENCOUNTER — Ambulatory Visit: Payer: Medicare Other | Admitting: Physician Assistant

## 2022-05-08 ENCOUNTER — Ambulatory Visit (HOSPITAL_COMMUNITY): Payer: Medicare Other | Attending: Cardiology

## 2022-05-08 VITALS — BP 136/76 | HR 66 | Ht 73.0 in | Wt 137.0 lb

## 2022-05-08 DIAGNOSIS — Z720 Tobacco use: Secondary | ICD-10-CM

## 2022-05-08 DIAGNOSIS — Z952 Presence of prosthetic heart valve: Secondary | ICD-10-CM

## 2022-05-08 DIAGNOSIS — I10 Essential (primary) hypertension: Secondary | ICD-10-CM | POA: Diagnosis not present

## 2022-05-08 DIAGNOSIS — R935 Abnormal findings on diagnostic imaging of other abdominal regions, including retroperitoneum: Secondary | ICD-10-CM | POA: Diagnosis not present

## 2022-05-08 DIAGNOSIS — I739 Peripheral vascular disease, unspecified: Secondary | ICD-10-CM

## 2022-05-08 DIAGNOSIS — E875 Hyperkalemia: Secondary | ICD-10-CM

## 2022-05-08 LAB — ECHOCARDIOGRAM COMPLETE
AR max vel: 1.78 cm2
AV Area VTI: 2.2 cm2
AV Area mean vel: 2.12 cm2
AV Mean grad: 10 mmHg
AV Peak grad: 18.5 mmHg
Ao pk vel: 2.15 m/s
Area-P 1/2: 2.07 cm2
P 1/2 time: 550 msec
S' Lateral: 3.5 cm

## 2022-05-08 NOTE — Patient Instructions (Signed)

## 2022-05-13 NOTE — Progress Notes (Addendum)
? ? ?GI Office Note   ? ?Referring Provider: Lemmie Evens, MD ?Primary Care Physician:  Lemmie Evens, MD  ?Primary Gastroenterologist: Dr. Abbey Chatters ? ?Chief Complaint  ? ?Chief Complaint  ?Patient presents with  ? Colonoscopy  ?  Diarrhea all the time. Pain in left side   ? ? ? ?History of Present Illness  ? ?Patrick Beck is a 76 y.o. male presenting today at the request of Lemmie Evens, MD for change in bowel habits. ? ?Last colonoscopy April 2014 by Dr. Arnoldo Morale with general surgery -small sessile polyp in the transverse colon (tubular adenoma), small sessile polyp in the distal transverse colon (tubulovillous adenoma), large pedunculated polyp in the rectum (tubulovillous adenoma withi high grade dysplasia), mild diverticulosis noted throughout the entire colon. ? ?Per review of last PCP note, he recently had some abdominal pain for which his PCP ordered lab work for.  He subsequently presented to the ED very next day for his abdominal pain which then subsided in the ED.  At his last PCP visit he had noted diarrhea for about 3 weeks having liquid stool about twice a day without any abdominal pain or loss of appetite.  Reports he has had diarrhea in the past and use Lomotil that was helpful.  He is also continue to complain of some acid reflux and stated that the hospital put him on famotidine but he wanted to go back on pantoprazole.  He was recently put on Lomotil for the diarrhea, ordered follow-up blood work, and placed him on pantoprazole. ? ?Most recent CT imaging 03/21/2022 noting cyst in the left lobe of the liver, unremarkable gallbladder, sequela of chronic pancreatitis with severe pancreatic atrophy and coarse parenchymal calcifications, mild thickening of bilateral adrenal glands without discrete nodularity, prominent gastric fold thickening of nondistended stomach which may reflect gastritis however there is a possibility of infiltrative gastric neoplasm thus not entirely excluded, prominence of  the gastric antrum, duodenal bulb and proximal duodenum measuring up to 4.9 cm in diameter, there is some tapering of the duodenum behind the SMV and SMA with fluid-filled loops of small bowel throughout the remainder of the abdomen most consistent with enteritis without evidence of obstruction, small to moderate volume of formed stool throughout the colon.  Follow-up with nonemergent endoscopy recommended. ? ?Most recent labs from ED visit in March showed mild anemia however this is chronic ranging from 11.2-12.8, CRP 19.7, normal LFTs and renal function, lipase normal.  Given his imaging findings he was recommended to be admitted to the hospital for endoscopy but declined.  At this point he denied any diarrhea. ? ?Most recent EF 65% post TAVR on echo performed 05/08/22. ? ? ?Today: ? ?Change in bowel habits - diarrhea for about a month now, watery with a few lumps, sometimes half solid. Has had some accidents. Does have fecal urgency. Feels like he thought maybe it was his BP medication being changed but it has continued.  Has had some abdominal pain that he would have pain in the middle of his abdomen that would shoot across his belly, would take narcotics to help and then would go to the hospital. The second time it started in the RLQ and extended to his diaphragm, pain got better but continued to be sore. No N/V. No lack of appetite. Some early satiety. Lost 40 pounds in the last 2 years. Reports he has no strength - has to call EMT to pick him, has fallen 4-5 times the last couple of weeks, unable  to get off the commode at times. No rectal pain. No blood or black stool. No nocturnal stools. Will have some loose stools with coffee as well.  Currently taking lomotil, sees maybe some relief and has been trying to eat yogurt.  ? ?No dysphagia, taking pantoprazole and it is controlled.  ? ?Reports he had a heart procedure in August 2022 for a valve. Does have shortness of breath related to his COPD. No chest pain.  Does report he smokes about 2 packs/day. Does report that when he was put on the furosemide and 12.5 HCTZ he would have normal bowel movements then would have diarrhea, thought that he felt like when his HCTZ was increased to 25 the diarrhea began just has not went away.  ? ?Reports GERD and high acidity all run in his family. Has tried many antacids in the past and hasn't had much change in his symptoms and reports he didn't want to get back in to all the previous workup. Reports he knows it is his acid reflux and thought they brought up the cancer due to get more money in the past.  ? ? ?Past Medical History:  ?Diagnosis Date  ? Aortic atherosclerosis ([place])   ? Arthritis   ? Carotid artery disease ([place])   ? COPD (chronic obstructive pulmonary disease) ([place])   ? Diverticulosis   ? Erectile dysfunction   ? Essential hypertension   ? GERD (gastroesophageal reflux disease)   ? Glaucoma   ? H/O hiatal hernia   ? History of hyperlipidemia   ? Neuropathy   ? PAD (peripheral artery disease) ([place])   ? Pneumonia ish  ? 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 s/p TAVR with a 29 mm Edwards Sapien THV via the subclavian approach by Dr. Angelena Form & Dr. Cyndia Bent  ? Severe aortic stenosis   ? Type 2 diabetes mellitus ([place])   ? ? ?Past Surgical History:  ?Procedure Laterality Date  ? CARDIAC CATHETERIZATION    ? COLONOSCOPY N/A 04/06/2013  ? Procedure: COLONOSCOPY;  Surgeon: Jamesetta So, MD;  [facility];  Service: Gastroenterology;  Laterality: N/A;  ? EYE SURGERY Left   ? laser sx fr glaucoma  ? HERNIA REPAIR    ? Three prior surgeries before 1966  ? MULTIPLE EXTRACTIONS WITH ALVEOLOPLASTY N/A 05/24/2021  ? Procedure: EXTRACTION OF TEETH NUMBER TWO, THREE, FIVE, SIX, SEVEN, EIGHT, NINE, TEN, ELEVEN, TWELVE, SEVENTEEN, EIGHTTEEN, NINETEEN, TWENTY, TWENTY-ONE, TWENTY- TWO, TWENTY-FOUR, THWENTY-SEVEN, TWENTY-EIGHT, TWENTY-NINE WITH ALVEOLOPLASTY IN ALL FOUR QUADRANTS;  Surgeon: [name],  DMD;  Location: [facility];  Service: Dentistry;  Laterality: N/A;  ? RIGHT/LEFT HEART CATH AND CORONARY ANGIOGRAPHY N/A 04/18/2021  ? Procedure: RIGHT/LEFT HEART CATH AND CORONARY ANGIOGRAPHY;  Surgeon: Burnell Blanks, MD;  Location: [facility];  Service: Cardiovascular;  Laterality: N/A;  ? TEE WITHOUT CARDIOVERSION N/A 06/26/2021  ? Procedure: TRANSESOPHAGEAL ECHOCARDIOGRAM (TEE);  Surgeon: Burnell Blanks, MD;  Location: [facility];  Service: Open Heart Surgery;  Laterality: N/A;  ? TONSILLECTOMY    ? at age 2  ? ? ?Current Outpatient Medications  ?Medication Sig Dispense Refill  ? ACCU-CHEK GUIDE test strip SMARTSIG:Via Meter 4-5 Times Daily    ? aspirin 81 MG chewable tablet Chew 1 tablet (81 mg total) by mouth daily. Can swallow whole 90 tablet 3  ? brimonidine (ALPHAGAN) 0.2 % ophthalmic solution Place 1 drop into both eyes 3 (three) times daily.    ? diphenhydrAMINE (BENADRYL) 25  MG tablet Take 25 mg by mouth daily as needed for allergies.    ? furosemide (LASIX) 40 MG tablet Take 40 mg by mouth daily.    ? hydrochlorothiazide (HYDRODIURIL) 25 MG tablet Take 25 mg by mouth daily.    ? hydrocortisone cream 1 % Apply 1 application topically 2 (two) times daily as needed (bug bites).    ? latanoprost (XALATAN) 0.005 % ophthalmic solution Place 1 drop into both eyes at bedtime.    ? Melatonin 10 MG CAPS Take 20-30 mg by mouth at bedtime.    ? metFORMIN (GLUCOPHAGE) 1000 MG tablet Take 1,000 mg by mouth 2 (two) times daily with a meal.    ? neomycin-bacitracin-polymyxin (NEOSPORIN) ointment Apply 1 application topically as needed for wound care.    ? pantoprazole (PROTONIX) 40 MG tablet TAKE 1 TABLET BY MOUTH EVERY DAY 30 tablet 0  ? pravastatin (PRAVACHOL) 40 MG tablet Take 40 mg by mouth every evening.    ? timolol (TIMOPTIC) 0.5 % ophthalmic solution Place 1 drop into both eyes daily.    ? glimepiride (AMARYL) 1 MG tablet Take 0.5 mg by mouth daily.    ? ?No current facility-administered  medications for this visit.  ? ? ?Allergies as of 05/14/2022 - Review Complete 05/14/2022  ?Allergen Reaction Noted  ? Macadamia nut oil Anaphylaxis 03/25/2013  ? ? ?Family History  ?Problem Relation Age of

## 2022-05-14 ENCOUNTER — Encounter: Payer: Self-pay | Admitting: Gastroenterology

## 2022-05-14 ENCOUNTER — Ambulatory Visit: Payer: Medicare Other | Admitting: Gastroenterology

## 2022-05-14 ENCOUNTER — Telehealth: Payer: Self-pay | Admitting: *Deleted

## 2022-05-14 VITALS — BP 128/74 | HR 84 | Temp 97.5°F | Ht 73.0 in | Wt 136.0 lb

## 2022-05-14 DIAGNOSIS — R933 Abnormal findings on diagnostic imaging of other parts of digestive tract: Secondary | ICD-10-CM | POA: Diagnosis not present

## 2022-05-14 DIAGNOSIS — K219 Gastro-esophageal reflux disease without esophagitis: Secondary | ICD-10-CM | POA: Diagnosis not present

## 2022-05-14 DIAGNOSIS — R197 Diarrhea, unspecified: Secondary | ICD-10-CM

## 2022-05-14 DIAGNOSIS — Z86010 Personal history of colonic polyps: Secondary | ICD-10-CM | POA: Diagnosis not present

## 2022-05-14 NOTE — Telephone Encounter (Signed)
Attention: Preop ? ? ?We would like to request cardiac clearance for patient please. ? ?Procedure: Upper Endoscopy  ? ?Date: TBD ? ?Surgeon: Dr. Abbey Chatters  ? ?Phone: [phone] ? ?Fax:  [phone] ? ?Type of Anesthesia:  Propofol  ASA III  ? ? ? ? ? ? ?  ?

## 2022-05-14 NOTE — Telephone Encounter (Signed)
Sent cardiac clearance. Waiting for approval.   ?

## 2022-05-14 NOTE — Patient Instructions (Signed)
We are going to schedule you for an EGD with propofol with Dr. Abbey Chatters in near future. ?We will reach out to cardiology for clearance. ? ?Please continue to take Lomotil for diarrhea.  I am also providing you with some dietary recommendations. ? ?Continue to take your pantoprazole 40 mg daily, if you are reflux worsens you may take it twice a day. ? ?We will have you follow-up 1 month post procedure, to discuss scheduling colonoscopy to further evaluate your diarrhea and screening for colon cancer. ? ?It was a pleasure to see you today. I want to create trusting relationships with patients. If you receive a survey regarding your visit,  I greatly appreciate you taking time to fill this out on paper or through your MyChart. I value your feedback. ? ?Venetia Night, MSN, FNP-BC, AGACNP-BC ?[facility] ? ? ?

## 2022-05-15 NOTE — Telephone Encounter (Addendum)
? ? ?  Patient Name: Patrick Beck  ?DOB: 02/04/1946 ?MRN: [id number] ? ?Primary Cardiologist: Rozann Lesches, MD ? ?Chart reviewed as part of pre-operative protocol coverage. Given past medical history and time since last visit, based on ACC/AHA guidelines, [name] would be at acceptable risk for the planned procedure without further cardiovascular testing.  ? ?Patient was seen in the office by Angelena Form, PA on 05/08/2022 and was stable from a cardiac standpoint. Since the time of his visit, patient has done well from a cardiac standpoint.  He denies any new or concerning symptoms, repots METS >4.  ? ?The patient was advised that if he develops new symptoms prior to surgery to contact our office to arrange for a follow-up visit, and he verbalized understanding. ? ?I will route this recommendation to the requesting party via Epic fax function and remove from pre-op pool. ? ?Please call with questions. ? ?Lenna Sciara, NP ?05/15/2022, 10:54 AM  ?

## 2022-05-21 ENCOUNTER — Telehealth: Payer: Self-pay | Admitting: *Deleted

## 2022-05-21 NOTE — Telephone Encounter (Signed)
Received cardiac clearance. Copy placed on providers desk.

## 2022-05-21 NOTE — Telephone Encounter (Signed)
Pre-op appt 06/04/22. Appt letter mailed with procedure instructions.

## 2022-05-21 NOTE — Telephone Encounter (Signed)
Called pt, informed him cardiac clearance was received for him to have EGD. EGD scheduled for 06/06/22 at 1:00pm. Orders entered.  PA for EGD submitted via [facility] website. PA# [id number], valid 06/06/22-09/04/22.

## 2022-06-01 ENCOUNTER — Other Ambulatory Visit: Payer: Self-pay

## 2022-06-01 ENCOUNTER — Encounter (HOSPITAL_COMMUNITY): Payer: Self-pay | Admitting: Emergency Medicine

## 2022-06-01 ENCOUNTER — Emergency Department (HOSPITAL_COMMUNITY): Payer: Medicare Other

## 2022-06-01 ENCOUNTER — Emergency Department (HOSPITAL_COMMUNITY)
Admission: EM | Admit: 2022-06-01 | Discharge: 2022-06-01 | Disposition: A | Discharge status: Home / Self Care | Payer: Medicare Other | Attending: Emergency Medicine | Admitting: Emergency Medicine

## 2022-06-01 DIAGNOSIS — Z7984 Long term (current) use of oral hypoglycemic drugs: Secondary | ICD-10-CM | POA: Diagnosis not present

## 2022-06-01 DIAGNOSIS — S41112A Laceration without foreign body of left upper arm, initial encounter: Secondary | ICD-10-CM | POA: Diagnosis not present

## 2022-06-01 DIAGNOSIS — M545 Low back pain, unspecified: Secondary | ICD-10-CM | POA: Diagnosis not present

## 2022-06-01 DIAGNOSIS — W010XXA Fall on same level from slipping, tripping and stumbling without subsequent striking against object, initial encounter: Secondary | ICD-10-CM | POA: Diagnosis not present

## 2022-06-01 DIAGNOSIS — I719 Aortic aneurysm of unspecified site, without rupture: Secondary | ICD-10-CM | POA: Diagnosis not present

## 2022-06-01 DIAGNOSIS — Z7982 Long term (current) use of aspirin: Secondary | ICD-10-CM | POA: Diagnosis not present

## 2022-06-01 DIAGNOSIS — J449 Chronic obstructive pulmonary disease, unspecified: Secondary | ICD-10-CM | POA: Insufficient documentation

## 2022-06-01 DIAGNOSIS — E119 Type 2 diabetes mellitus without complications: Secondary | ICD-10-CM | POA: Diagnosis not present

## 2022-06-01 DIAGNOSIS — S4992XA Unspecified injury of left shoulder and upper arm, initial encounter: Secondary | ICD-10-CM | POA: Diagnosis present

## 2022-06-01 MED ORDER — HYDROMORPHONE HCL 1 MG/ML IJ SOLN
0.5000 mg | Freq: Once | INTRAMUSCULAR | Status: AC
  Administered 2022-06-01: 0.5 mg via INTRAMUSCULAR
  Filled 2022-06-01: qty 0.5

## 2022-06-01 NOTE — ED Provider Notes (Signed)
[facility] EMERGENCY DEPARTMENT Provider Note   CSN: [id number] Arrival date & time: 06/01/22  2144     History  Chief Complaint  Patient presents with   Fall   Back Pain    Patrick Beck is a 76 y.o. male.  HPI Patient is a 76 year old male with a history of COPD, diabetes mellitus, PAD, who presents to the emergency department due to a fall that occurred around 3 PM.  Patient states he was walking to the door when his house and tripped on the door frame.  States that he fell backwards onto his buttocks.  Initially was having mild pain across the lower back when this occurred.  He contacted EMS who came to his home and his pain resolved shortly thereafter so he did not come to the emergency department.  He states as the day progressed he continued to ambulate and his pain worsened so he contacted EMS for transport to the emergency department.  States that he did not hit his head during the fall.  Denies LOC or anticoagulation.  Does report a skin tear to the left upper arm.  Besides his low back, he denies any other regions of pain.  Denies any acute numbness, weakness, bowel/bladder incontinence    Home Medications Prior to Admission medications   Medication Sig Start Date End Date Taking? Authorizing Provider  ACCU-CHEK GUIDE test strip SMARTSIG:Via Meter 4-5 Times Daily 06/18/21   [provider]  aspirin 81 MG chewable tablet Chew 1 tablet (81 mg total) by mouth daily. Can swallow whole 06/28/21   Eileen Stanford, PA-C  brimonidine [facility]) 0.2 % ophthalmic solution Place 1 drop into both eyes 3 (three) times daily. 05/23/20   [provider]  diphenhydrAMINE (BENADRYL) 25 MG tablet Take 25 mg by mouth daily as needed for allergies.    [provider]  furosemide (LASIX) 40 MG tablet Take 40 mg by mouth daily.    [provider]  glimepiride (AMARYL) 1 MG tablet Take 0.5 mg by mouth daily. 05/13/22   [provider]   hydrochlorothiazide (HYDRODIURIL) 25 MG tablet Take 25 mg by mouth daily. 03/04/22   [provider]  hydrocortisone cream 1 % Apply 1 application topically 2 (two) times daily as needed (bug bites).    [provider]  latanoprost (XALATAN) 0.005 % ophthalmic solution Place 1 drop into both eyes at bedtime. 10/14/17   [provider]  Melatonin 10 MG CAPS Take 20-30 mg by mouth at bedtime.    [provider]  metFORMIN (GLUCOPHAGE) 1000 MG tablet Take 1,000 mg by mouth 2 (two) times daily with a meal.    [provider]  neomycin-bacitracin-polymyxin (NEOSPORIN) ointment Apply 1 application topically as needed for wound care.    [provider]  pantoprazole (PROTONIX) 40 MG tablet TAKE 1 TABLET BY MOUTH EVERY DAY 03/29/22   Eileen Stanford, PA-C  pravastatin (PRAVACHOL) 40 MG tablet Take 40 mg by mouth every evening.    [provider]  timolol (TIMOPTIC) 0.5 % ophthalmic solution Place 1 drop into both eyes daily. 08/16/21   [provider]      Allergies    Macadamia nut oil    Review of Systems   Review of Systems  All other systems reviewed and are negative. Ten systems reviewed and are negative for acute change, except as noted in the HPI.   Physical Exam Updated Vital Signs BP (!) 143/87   Pulse 72  Temp 98.1 F (36.7 C) (Oral)   Resp 17   Wt 61.2 kg   SpO2 95%   BMI 17.81 kg/m  Physical Exam Vitals and nursing note reviewed.  Constitutional:      General: He is not in acute distress.    Appearance: Normal appearance. He is not ill-appearing, toxic-appearing or diaphoretic.     Comments: Cachectic appearing elderly male.  HENT:     Head: Normocephalic and atraumatic.     Right Ear: External ear normal.     Left Ear: External ear normal.     Nose: Nose normal.     Mouth/Throat:     Mouth: Mucous membranes are moist.     Pharynx: Oropharynx is clear. No oropharyngeal exudate or posterior  oropharyngeal erythema.  Eyes:     Extraocular Movements: Extraocular movements intact.  Cardiovascular:     Rate and Rhythm: Normal rate and regular rhythm.     Pulses: Normal pulses.     Heart sounds: Normal heart sounds. No murmur heard.   No friction rub. No gallop.  Pulmonary:     Effort: Pulmonary effort is normal. No respiratory distress.     Breath sounds: Normal breath sounds. No stridor. No wheezing, rhonchi or rales.     Comments: No anterior chest wall tenderness.  Lungs are clear to auscultation bilaterally. Chest:     Chest wall: No tenderness.  Abdominal:     General: Abdomen is flat.     Palpations: Abdomen is soft.     Tenderness: There is no abdominal tenderness.     Comments: Abdomen is soft and nontender in all 4 quadrants.  Musculoskeletal:        General: Tenderness present. Normal range of motion.     Cervical back: Normal range of motion and neck supple. No tenderness.     Comments: No midline C or T spine tenderness noted.  Mild tenderness appreciated in the midline lumbar spine as well as the bilateral lumbar musculature.  No step-offs, crepitus, or deformities.  No palpable tenderness noted in all 4 extremities.  No pelvic instability.  Full active and passive range of motion's of the bilateral hips, knees, and ankles.  Distal sensation intact in the lower extremities.  Palpable pedal pulses.  Skin:    General: Skin is warm and dry.     Comments: 2 cm skin tear noted to the left upper arm.  Appears well approximated and has well-healing granulated tissue at the site. No active bleeding.  Neurological:     General: No focal deficit present.     Mental Status: He is alert and oriented to person, place, and time.     Comments: Speaking clearly and coherently.  A&O x3.  Moving all 4 extremities with ease.  No gross deficits.  Psychiatric:        Mood and Affect: Mood normal.        Behavior: Behavior normal.   ED Results / Procedures / Treatments    Labs (all labs ordered are listed, but only abnormal results are displayed) Labs Reviewed - No data to display  EKG None  Radiology CT Lumbar Spine Wo Contrast  Result Date: 06/01/2022 CLINICAL DATA:  Initial evaluation for acute trauma, fall. EXAM: CT LUMBAR SPINE WITHOUT CONTRAST TECHNIQUE: Multidetector CT imaging of the lumbar spine was performed without intravenous contrast administration. Multiplanar CT image reconstructions were also generated. RADIATION DOSE REDUCTION: This exam was performed according to the departmental dose-optimization program which includes automated exposure  control, adjustment of the mA and/or kV according to patient size and/or use of iterative reconstruction technique. COMPARISON:  Prior CT from 04/27/2021. FINDINGS: Segmentation: Standard. Lowest well-formed disc space labeled the L5-S1 level. Alignment: Physiologic with preservation of the normal lumbar lordosis. No listhesis. Vertebrae: Vertebral body height maintained without acute or chronic fracture. Visualized sacrum and pelvis intact. SI joints symmetric and within normal limits. No discrete or worrisome osseous lesions. Paraspinal and other soft tissues: Paraspinous soft tissues demonstrate no acute finding. Severe atherosclerotic disease. 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 Stigmata of chronic pancreatitis noted. Disc levels: Mild noncompressive disc bulging seen within the lumbar spine, most pronounced at L5-S1. Mild lower lumbar facet hypertrophy. No significant spinal stenosis. Foramina remain patent. IMPRESSION: 1. No acute traumatic injury within the lumbar spine. 2. 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 Recommend follow-up every 3 years. This recommendation follows ACR consensus guidelines: White Paper of the ACR Incidental Findings Committee II on Vascular Findings. J Am Coll  Radiol 2013; 10:789-794. 3. 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 4. Stigmata of chronic pancreatitis. Electronically Signed   By: Jeannine Boga M.D.   On: 06/01/2022 22:48    Procedures Procedures   Medications Ordered in ED Medications  HYDROmorphone (DILAUDID) injection 0.5 mg (0.5 mg Intramuscular Given 06/01/22 2234)    ED Course/ Medical Decision Making/ A&P                           Medical Decision Making Amount and/or Complexity of Data Reviewed Radiology: ordered.  Risk Prescription drug management.   Pt is a 76 y.o. male who presents to the emergency department due to a mechanical fall that occurred earlier today.  States he tripped going through the doorway and landed on his buttocks.  Reported mild pain in the low back after this occurred but this improved for short period of time before continuing to worsen throughout the day so he called EMS and was transported to the emergency department.  Imaging: CT scan of the lumbar spine shows 1. No acute traumatic injury within the lumbar spine. 2. 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 Recommend follow-up every 3 years. This recommendation follows ACR consensus guidelines: White Paper of the ACR Incidental Findings Committee II on Vascular Findings. J Am Coll Radiol 2013; 10:789-794. 3. 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 4. Stigmata of chronic pancreatitis.  I, Rayna Sexton, PA-C, personally reviewed and evaluated these images and lab results as part of my medical decision-making.  On my exam patient is A&O x3.  Moving all 4 extremities.  Following commands.  No gross deficits.  He does have mild tenderness diffusely in the lumbar region.  No step-offs, crepitus, or deformities.  Neurovascularly intact in the lower extremities.  No pelvic instability.  Full range of motion of the hips, knees, and ankles.  Given his age I obtained a CT  scan of the lumbar spine with findings as noted above.  CT shows "no acute traumatic injury within the lumbar spine."  They did note aortic aneurysm measuring 3.1 cm as well as an additional left common iliac artery aneurysm measuring up to 2.1 cm.  Discussed these findings with the patient at length.  Recommended PCP follow-up for further management.  He  verbalized understanding.  Patient otherwise had a reassuring physical exam.  Denies any other regions of pain or trauma.  Specifically denies any head trauma.  Denies being anticoagulated.  Patient's pain was treated with 0.5 mg of IM Dilaudid.  Reports moderate improvement.  He appears stable for discharge at this time and he is agreeable.  We discussed return precautions.  His questions were answered and he was amicable at the time of discharge.  Note: Portions of this report may have been transcribed using voice recognition software. Every effort was made to ensure accuracy; however, inadvertent computerized transcription errors may be present.   Final Clinical Impression(s) / ED Diagnoses Final diagnoses:  Acute bilateral low back pain without sciatica  Aortic aneurysm without rupture, unspecified portion of aorta [facility])    Rx / DC Orders ED Discharge Orders     None         Rayna Sexton, PA-C 06/02/22 1310    Noemi Chapel, MD 06/03/22 1230

## 2022-06-01 NOTE — Discharge Instructions (Signed)
Like we discussed, please continue to monitor your symptoms closely and return to the emergency department with any new or worsening symptoms.  You were found to have an aortic aneurysm on your CT scan.  You will need to have this monitored moving forward.  Please follow-up with your primary care provider and discuss this further with them.

## 2022-06-01 NOTE — ED Notes (Signed)
Patient transported to CT 

## 2022-06-01 NOTE — ED Triage Notes (Signed)
Pt in from home via [facility] after trip fall at 3pm going into doorway at his house. Pt landed onto L side, is reporting low back pain, "kidney to kidney". Denies LOC, no thinners. Has L upper arm skin tear. No other complaints at this time, did not hit head. VSS

## 2022-06-03 NOTE — Patient Instructions (Signed)
Patrick Beck  06/03/2022     '@[email or web address]'$ @   Your procedure is scheduled on  06/06/2022.   Report to [facility] at  1100 A.M.   Call this number if you have problems the morning of surgery:  [phone]   Remember:  Follow the diet instructions given to you by the office.     Take these medicines the morning of surgery with A SIP OF WATER                                         protonix.    Do not wear jewelry, make-up or nail polish.  Do not wear lotions, powders, or perfumes, or deodorant.  Do not shave 48 hours prior to surgery.  Men may shave face and neck.  Do not bring valuables to the hospital.  [facility] is not responsible for any belongings or valuables.  Contacts, dentures or bridgework may not be worn into surgery.  Leave your suitcase in the car.  After surgery it may be brought to your room.  For patients admitted to the hospital, discharge time will be determined by your treatment team.  Patients discharged the day of surgery will not be allowed to drive home and must have someone with them for 24 hours.    Special instructions:   DO NOT smoke tobacco or vape for 24 hours before your procedure.  Please read over the following fact sheets that you were given. Anesthesia Post-op Instructions and Care and Recovery After Surgery      Esophageal Dilatation Esophageal dilatation, also called esophageal dilation, is a procedure to widen or open a blocked or narrowed part of the esophagus. The esophagus is the part of the body that moves food and liquid from the mouth to the stomach. You may need this procedure if: You have a buildup of scar tissue in your esophagus that makes it difficult, painful, or impossible to swallow. This can be caused by gastroesophageal reflux disease (GERD). You have cancer of the esophagus. There is a problem with how food moves through your esophagus. In some cases, you may need this procedure repeated at a later  time to dilate the esophagus gradually. Tell a health care provider about: Any allergies you have. All medicines you are taking, including vitamins, herbs, eye drops, creams, and over-the-counter medicines. Any problems you or family members have had with anesthetic medicines. Any blood disorders you have. Any surgeries you have had. Any medical conditions you have. Any antibiotic medicines you are required to take before dental procedures. Whether you are pregnant or may be pregnant. What are the risks? Generally, this is a safe procedure. However, problems may occur, including: Bleeding due to a tear in the lining of the esophagus. A hole, or perforation, in the esophagus. What happens before the procedure? Ask your health care provider about: Changing or stopping your regular medicines. This is especially important if you are taking diabetes medicines or blood thinners. Taking medicines such as aspirin and ibuprofen. These medicines can thin your blood. Do not take these medicines unless your health care provider tells you to take them. Taking over-the-counter medicines, vitamins, herbs, and supplements. Follow instructions from your health care provider about eating or drinking restrictions. Plan to have a responsible adult take you home from the hospital or clinic. Plan to have a  responsible adult care for you for the time you are told after you leave the hospital or clinic. This is important. What happens during the procedure? You may be given a medicine to help you relax (sedative). A numbing medicine may be sprayed into the back of your throat, or you may gargle the medicine. Your health care provider may perform the dilatation using various surgical instruments, such as: Simple dilators. This instrument is carefully placed in the esophagus to stretch it. Guided wire bougies. This involves using an endoscope to insert a wire into the esophagus. A dilator is passed over this wire to  enlarge the esophagus. Then the wire is removed. Balloon dilators. An endoscope with a small balloon is inserted into the esophagus. The balloon is inflated to stretch the esophagus and open it up. The procedure may vary among health care providers and hospitals. What can I expect after the procedure? Your blood pressure, heart rate, breathing rate, and blood oxygen level will be monitored until you leave the hospital or clinic. Your throat may feel slightly sore and numb. This will get better over time. You will not be allowed to eat or drink until your throat is no longer numb. When you are able to drink, urinate, and sit on the edge of the bed without nausea or dizziness, you may be able to return home. Follow these instructions at home: Take over-the-counter and prescription medicines only as told by your health care provider. If you were given a sedative during the procedure, it can affect you for several hours. Do not drive or operate machinery until your health care provider says that it is safe. Plan to have a responsible adult care for you for the time you are told. This is important. Follow instructions from your health care provider about any eating or drinking restrictions. Do not use any products that contain nicotine or tobacco, such as cigarettes, e-cigarettes, and chewing tobacco. If you need help quitting, ask your health care provider. Keep all follow-up visits. This is important. Contact a health care provider if: You have a fever. You have pain that is not relieved by medicine. Get help right away if: You have chest pain. You have trouble breathing. You have trouble swallowing. You vomit blood. You have black, tarry, or bloody stools. These symptoms may represent a serious problem that is an emergency. Do not wait to see if the symptoms will go away. Get medical help right away. Call your local emergency services (911 in the [place] Do not drive yourself to the  hospital. Summary Esophageal dilatation, also called esophageal dilation, is a procedure to widen or open a blocked or narrowed part of the esophagus. Plan to have a responsible adult take you home from the hospital or clinic. For this procedure, a numbing medicine may be sprayed into the back of your throat, or you may gargle the medicine. Do not drive or operate machinery until your health care provider says that it is safe. This information is not intended to replace advice given to you by your health care provider. Make sure you discuss any questions you have with your health care provider. Document Revised: 05/03/2020 Document Reviewed: 05/03/2020 Elsevier Patient Education  [facility], Adult, Care After This sheet gives you information about how to care for yourself after your procedure. Your health care provider may also give you more specific instructions. If you have problems or questions, contact your health care provider. What can I expect after  the procedure? After the procedure, it is common to have: A sore throat. Mild stomach pain or discomfort. Bloating. Nausea. Follow these instructions at home:  Follow instructions from your health care provider about what to eat or drink after your procedure. Return to your normal activities as told by your health care provider. Ask your health care provider what activities are safe for you. Take over-the-counter and prescription medicines only as told by your health care provider. If you were given a sedative during the procedure, it can affect you for several hours. Do not drive or operate machinery until your health care provider says that it is safe. Keep all follow-up visits as told by your health care provider. This is important. Contact a health care provider if you have: A sore throat that lasts longer than one day. Trouble swallowing. Get help right away if: You vomit blood or your vomit looks like  coffee grounds. You have: A fever. Bloody, black, or tarry stools. A severe sore throat or you cannot swallow. Difficulty breathing. Severe pain in your chest or abdomen. Summary After the procedure, it is common to have a sore throat, mild stomach discomfort, bloating, and nausea. If you were given a sedative during the procedure, it can affect you for several hours. Do not drive or operate machinery until your health care provider says that it is safe. Follow instructions from your health care provider about what to eat or drink after your procedure. Return to your normal activities as told by your health care provider. This information is not intended to replace advice given to you by your health care provider. Make sure you discuss any questions you have with your health care provider. Document Revised: 10/22/2019 Document Reviewed: 05/18/2018 Elsevier Patient Education  [place] After This sheet gives you information about how to care for yourself after your procedure. Your health care provider may also give you more specific instructions. If you have problems or questions, contact your health care provider. What can I expect after the procedure? After the procedure, it is common to have: Tiredness. Forgetfulness about what happened after the procedure. Impaired judgment for important decisions. Nausea or vomiting. Some difficulty with balance. Follow these instructions at home: For the time period you were told by your health care provider:     Rest as needed. Do not participate in activities where you could fall or become injured. Do not drive or use machinery. Do not drink alcohol. Do not take sleeping pills or medicines that cause drowsiness. Do not make important decisions or sign legal documents. Do not take care of children on your own. Eating and drinking Follow the diet that is recommended by your health care  provider. Drink enough fluid to keep your urine pale yellow. If you vomit: Drink water, juice, or soup when you can drink without vomiting. Make sure you have little or no nausea before eating solid foods. General instructions Have a responsible adult stay with you for the time you are told. It is important to have someone help care for you until you are awake and alert. Take over-the-counter and prescription medicines only as told by your health care provider. If you have sleep apnea, surgery and certain medicines can increase your risk for breathing problems. Follow instructions from your health care provider about wearing your sleep device: Anytime you are sleeping, including during daytime naps. While taking prescription pain medicines, sleeping medicines, or medicines that make you drowsy. Avoid smoking.  Keep all follow-up visits as told by your health care provider. This is important. Contact a health care provider if: You keep feeling nauseous or you keep vomiting. You feel light-headed. You are still sleepy or having trouble with balance after 24 hours. You develop a rash. You have a fever. You have redness or swelling around the IV site. Get help right away if: You have trouble breathing. You have new-onset confusion at home. Summary For several hours after your procedure, you may feel tired. You may also be forgetful and have poor judgment. Have a responsible adult stay with you for the time you are told. It is important to have someone help care for you until you are awake and alert. Rest as told. Do not drive or operate machinery. Do not drink alcohol or take sleeping pills. Get help right away if you have trouble breathing, or if you suddenly become confused. This information is not intended to replace advice given to you by your health care provider. Make sure you discuss any questions you have with your health care provider. Document Revised: 11/20/2021 Document Reviewed:  11/18/2019 Elsevier Patient Education  [place].

## 2022-06-04 ENCOUNTER — Telehealth: Payer: Self-pay | Admitting: *Deleted

## 2022-06-04 ENCOUNTER — Encounter (HOSPITAL_COMMUNITY)
Admission: RE | Admit: 2022-06-04 | Discharge: 2022-06-04 | Disposition: A | Discharge status: Home / Self Care | Payer: Medicare Other | Source: Ambulatory Visit | Attending: Internal Medicine | Admitting: Internal Medicine

## 2022-06-04 ENCOUNTER — Encounter (HOSPITAL_COMMUNITY): Payer: Self-pay

## 2022-06-04 DIAGNOSIS — E119 Type 2 diabetes mellitus without complications: Secondary | ICD-10-CM

## 2022-06-04 NOTE — Telephone Encounter (Signed)
-----   Message from Encarnacion Chu, RN sent at 06/04/2022 12:48 PM EDT ----- Regarding: no show Hey there! Patrick Beck did not show for his preop today.

## 2022-06-04 NOTE — Telephone Encounter (Addendum)
Called pt, no answer and VM full. Not able to leave VM

## 2022-06-04 NOTE — Pre-Procedure Instructions (Signed)
Messaged Mindy at [facility] to let her know that patient did not show for his preop.

## 2022-06-05 NOTE — Telephone Encounter (Signed)
Called pt, no answer and not able to leave VM. Endo will cancel procedure. Letter mailed to pt.

## 2022-06-06 ENCOUNTER — Encounter: Payer: Self-pay | Admitting: *Deleted

## 2022-06-06 NOTE — Telephone Encounter (Signed)
Patient called in. He has been scheduled for 7/5 at 9:45am. Aware will mail new instructions and new pre-op appt.  PA for EGD submitted via [facility] website. PA# [id number], valid 06/06/22-09/04/22

## 2022-06-26 NOTE — Patient Instructions (Signed)
20    Your procedure is scheduled on: 07/03/2022  Report to [facility] at   7:45  AM.  Call this number if you have problems the morning of surgery: [phone]   Remember:   Follow instructions on letter from office regarding when to stop eating and drinking        No Smoking the day of procedure      Take these medicines the morning of surgery with A SIP OF WATER: Pantoprazole  No diabetic medications am of procedure   Do not wear jewelry, make-up or nail polish.  Do not wear lotions, powders, or perfumes. You may wear deodorant.                Do not bring valuables to the hospital.  Contacts, dentures or bridgework may not be worn into surgery.  Leave suitcase in the car. After surgery it may be brought to your room.  For patients admitted to the hospital, checkout time is 11:00 AM the day of discharge.   Patients discharged the day of surgery will not be allowed to drive home. Upper Endoscopy, Adult Upper endoscopy is a procedure to look inside the upper GI (gastrointestinal) tract. The upper GI tract is made up of: The part of the body that moves food from your mouth to your stomach (esophagus). The stomach. The first part of your small intestine (duodenum). This procedure is also called esophagogastroduodenoscopy (EGD) or gastroscopy. In this procedure, your health care provider passes a thin, flexible tube (endoscope) through your mouth and down your esophagus into your stomach. A small camera is attached to the end of the tube. Images from the camera appear on a monitor in the exam room. During this procedure, your health care provider may also remove a small piece of tissue to be sent to a lab and examined under a microscope (biopsy). Your health care provider may do an upper endoscopy to diagnose cancers of the upper GI tract. You may also have this procedure to find the cause of other conditions, such as: Stomach pain. Heartburn. Pain or problems when  swallowing. Nausea and vomiting. Stomach bleeding. Stomach ulcers. Tell a health care provider about: Any allergies you have. All medicines you are taking, including vitamins, herbs, eye drops, creams, and over-the-counter medicines. Any problems you or family members have had with anesthetic medicines. Any blood disorders you have. Any surgeries you have had. Any medical conditions you have. Whether you are pregnant or may be pregnant. What are the risks? Generally, this is a safe procedure. However, problems may occur, including: Infection. Bleeding. Allergic reactions to medicines. A tear or hole (perforation) in the esophagus, stomach, or duodenum. What happens before the procedure? Staying hydrated Follow instructions from your health care provider about hydration, which may include: Up to 4 hours before the procedure - you may continue to drink clear liquids, such as water, clear fruit juice, black coffee, and plain tea.   Medicines Ask your health care provider about: Changing or stopping your regular medicines. This is especially important if you are taking diabetes medicines or blood thinners. Taking medicines such as aspirin and ibuprofen. These medicines can thin your blood. Do not take these medicines unless your health care provider tells you to take them. Taking over-the-counter medicines, vitamins, herbs, and supplements. General instructions Plan to have someone take you home from the hospital or clinic. If you will be going home right after the procedure, plan to have someone with you  for 24 hours. Ask your health care provider what steps will be taken to help prevent infection. What happens during the procedure?  An IV will be inserted into one of your veins. You may be given one or more of the following: A medicine to help you relax (sedative). A medicine to numb the throat (local anesthetic). You will lie on your left side on an exam table. Your health care  provider will pass the endoscope through your mouth and down your esophagus. Your health care provider will use the scope to check the inside of your esophagus, stomach, and duodenum. Biopsies may be taken. The endoscope will be removed. The procedure may vary among health care providers and hospitals. What happens after the procedure? Your blood pressure, heart rate, breathing rate, and blood oxygen level will be monitored until you leave the hospital or clinic. Do not drive for 24 hours if you were given a sedative during your procedure. When your throat is no longer numb, you may be given some fluids to drink. It is up to you to get the results of your procedure. Ask your health care provider, or the department that is doing the procedure, when your results will be ready. Summary Upper endoscopy is a procedure to look inside the upper GI tract. During the procedure, an IV will be inserted into one of your veins. You may be given a medicine to help you relax. A medicine will be used to numb your throat. The endoscope will be passed through your mouth and down your esophagus. This information is not intended to replace advice given to you by your health care provider. Make sure you discuss any questions you have with your health care provider. Document Revised: 06/10/2018 Document Reviewed: 05/18/2018 Elsevier Patient Education  [place] After  Please read the instructions outlined below and refer to this sheet in the next few weeks. These discharge instructions provide you with general information on caring for yourself after you leave the hospital. Your doctor may also give you specific instructions. While your treatment has been planned according to the most current medical practices available, unavoidable complications occasionally  occur. If you have any problems or questions after discharge, please call your doctor. HOME CARE INSTRUCTIONS Activity You may resume your regular activity but move at a slower pace for the next 24 hours.  Take frequent rest periods for the next 24 hours.  Walking will help expel (get rid of) the air and reduce the bloated feeling in your abdomen.  No driving for 24 hours (because of the anesthesia (medicine) used during the test).  You may shower.  Do not sign any important legal documents or operate any machinery for 24 hours (  because of the anesthesia used during the test).  Nutrition Drink plenty of fluids.  You may resume your normal diet.  Begin with a light meal and progress to your normal diet.  Avoid alcoholic beverages for 24 hours or as instructed by your caregiver.  Medications You may resume your normal medications unless your caregiver tells you otherwise. What you can expect today You may experience abdominal discomfort such as a feeling of fullness or "gas" pains.  You may experience a sore throat for 2 to 3 days. This is normal. Gargling with salt water may help this.  Follow-up Your doctor will discuss the results of your test with you. SEEK IMMEDIATE MEDICAL CARE IF: You have excessive nausea (feeling sick to your stomach) and/or vomiting.  You have severe abdominal pain and distention (swelling).  You have trouble swallowing.  You have a temperature over 100 F (37.8 C).  You have rectal bleeding or vomiting of blood.  Document Released: 07/30/2004 Document Revised: 12/05/2011 Document Reviewed: 02/10/2008

## 2022-06-27 IMAGING — DX DG CHEST 2V
2 series · 2 of 2 positions shown · non-contrast
Comparison: Chest x-ray 06/15/2021.

CLINICAL DATA: 75-year-old male with history of chest pain.

EXAM:
CHEST - 2 VIEW

[chest pa]
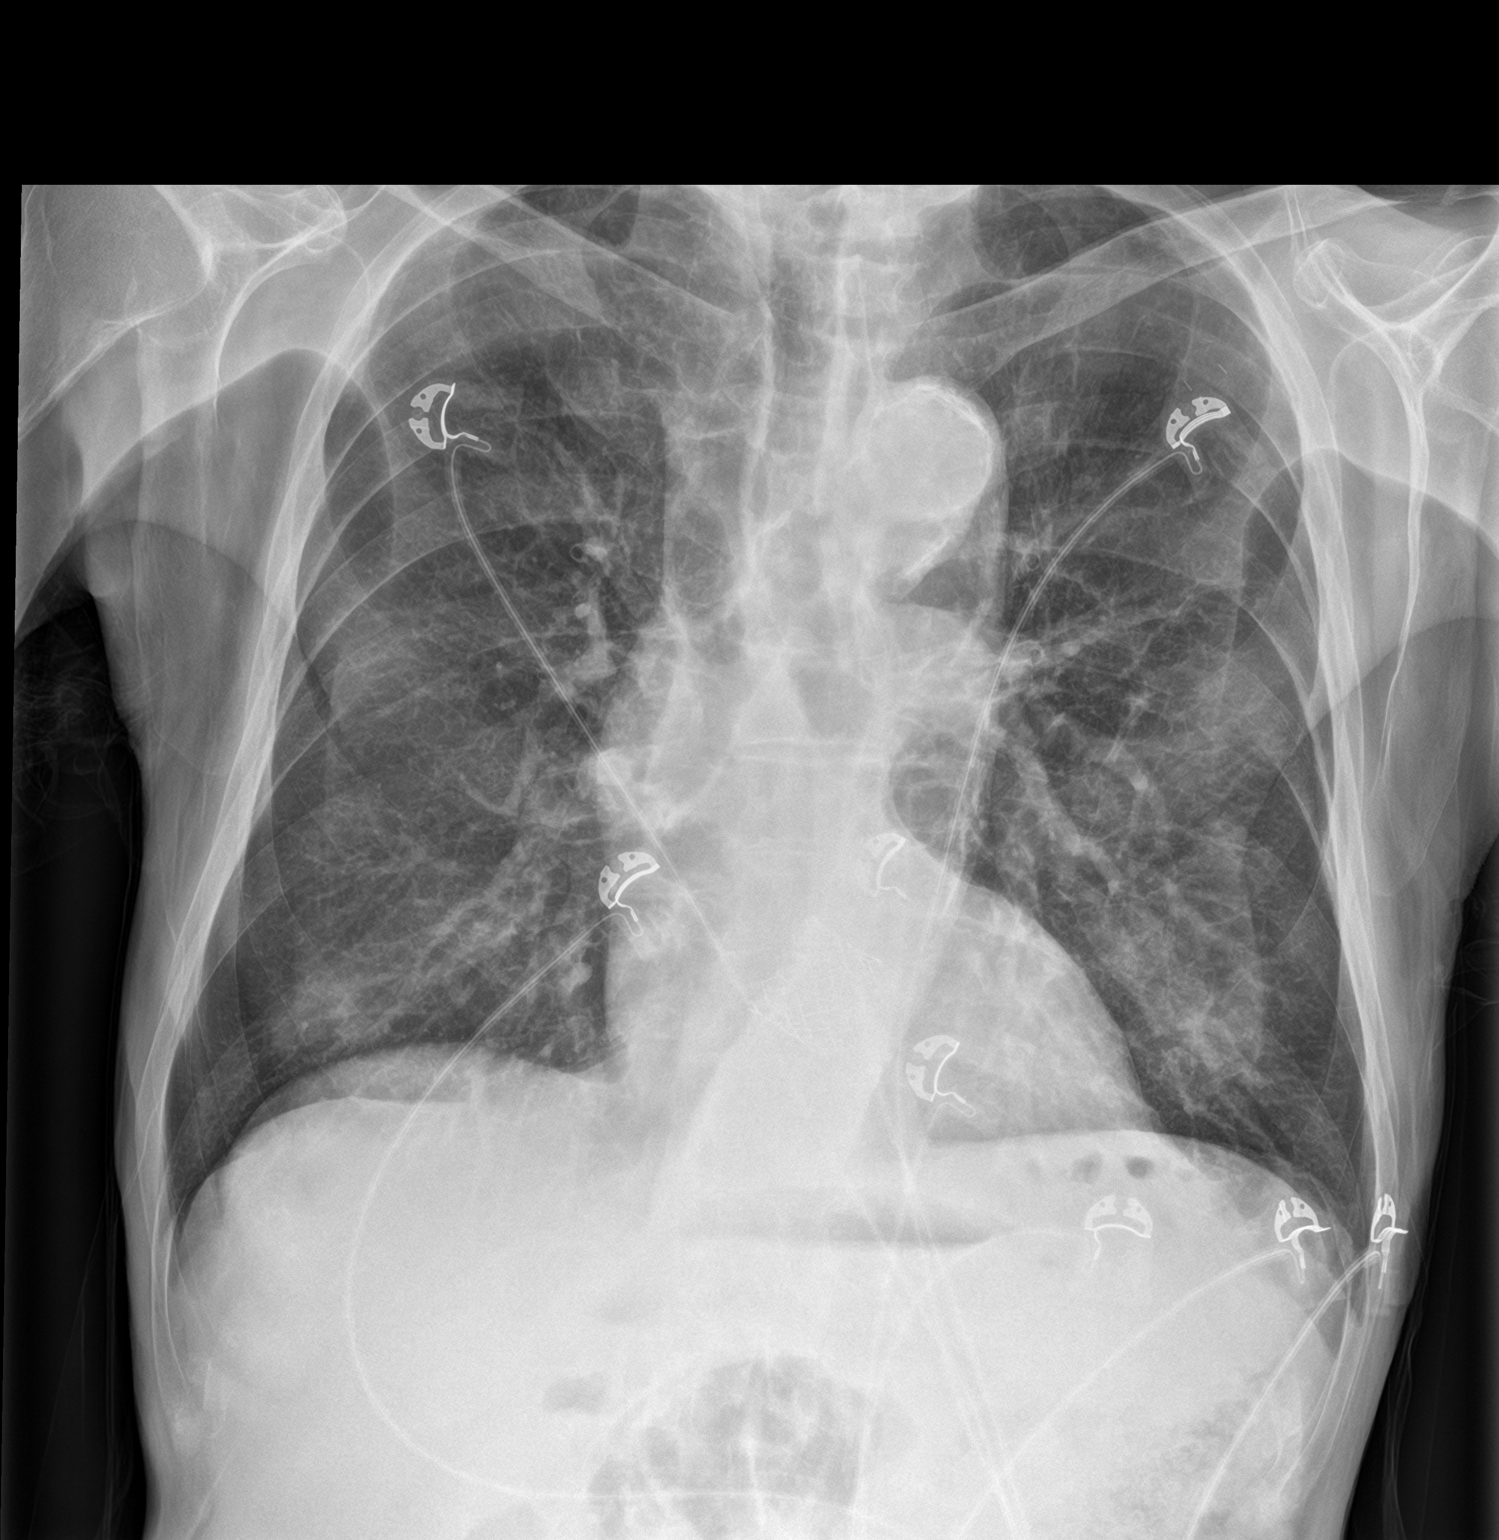

[chest lat]
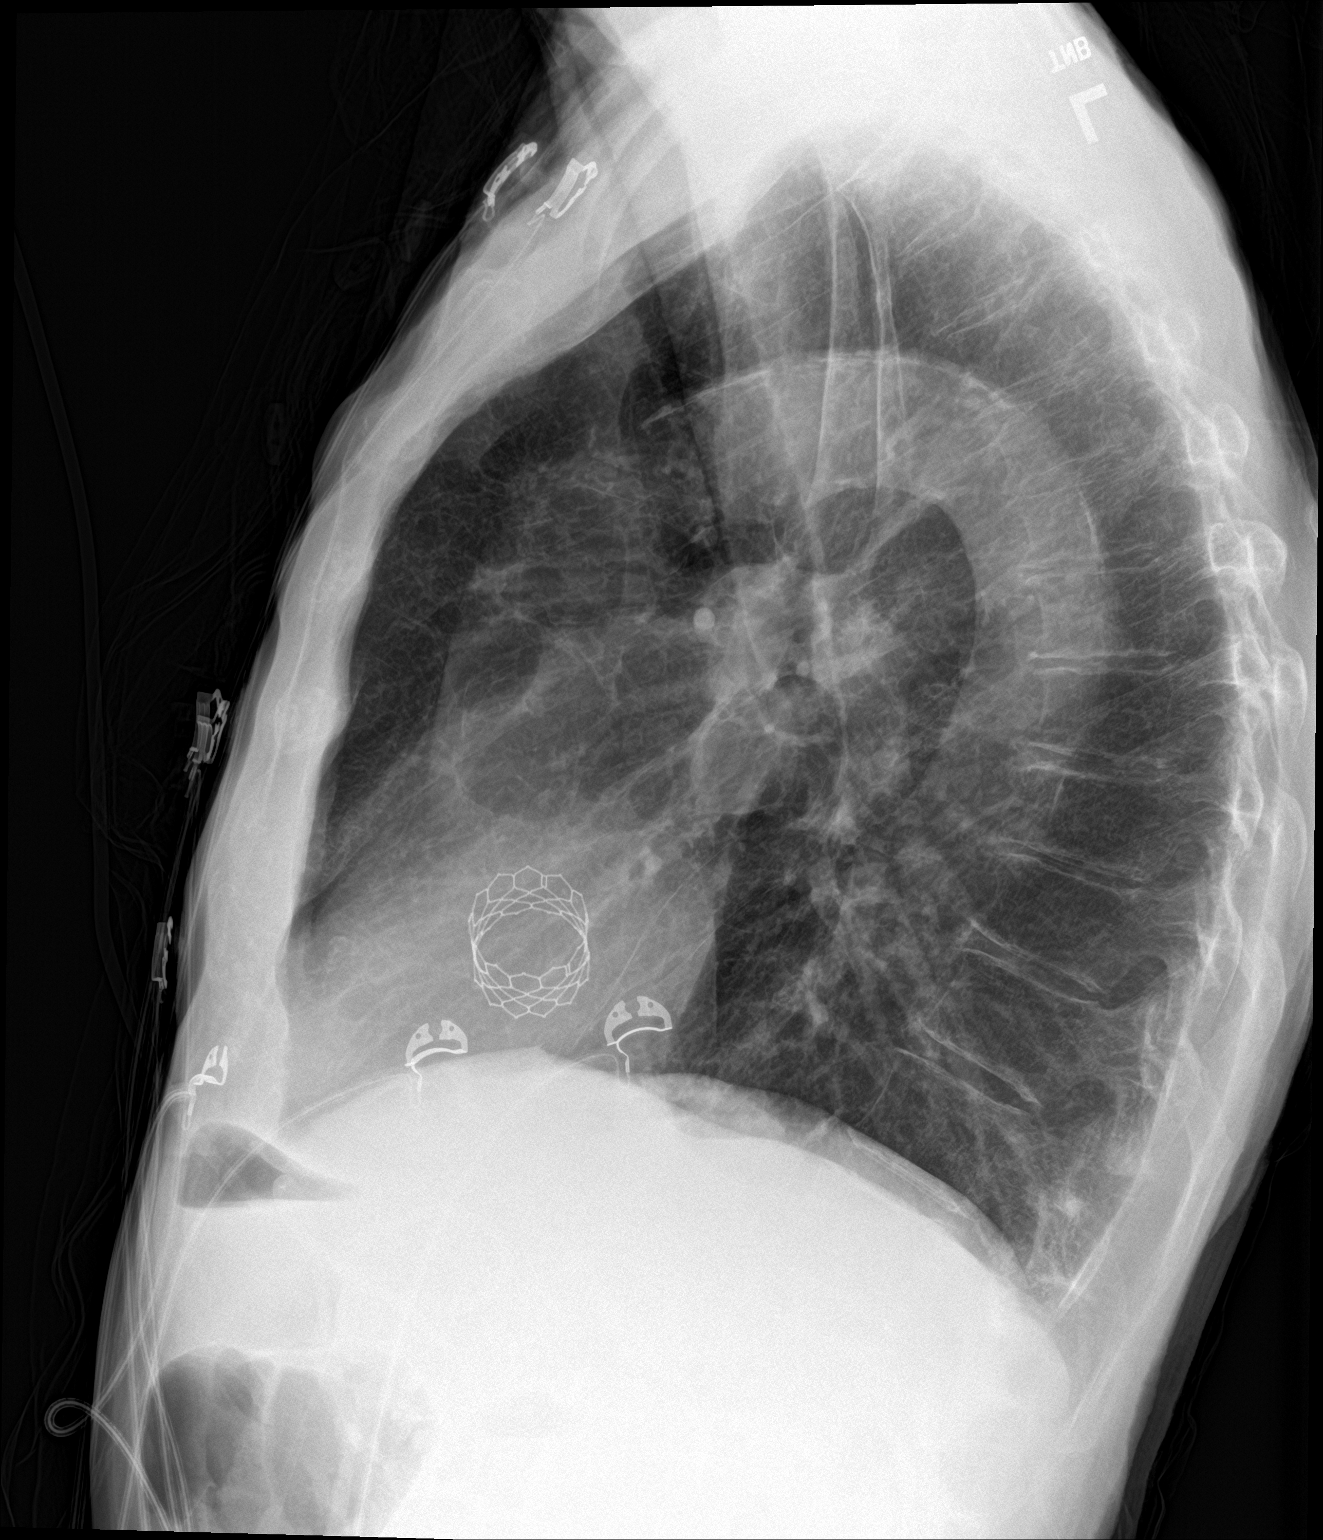

[2 of 2 positions shown; findings below may reference images not displayed]

FINDINGS: Lung volumes are normal. No consolidative airspace disease. No
pleural effusions. No pneumothorax. No pulmonary nodule or mass
noted. Pulmonary vasculature and the cardiomediastinal silhouette
are within normal limits. Atherosclerosis in the thoracic aorta.
Status post TAVR.
IMPRESSION: 1.  No radiographic evidence of acute cardiopulmonary disease.
2. Aortic atherosclerosis.

## 2022-06-27 IMAGING — CT CT ABD-PELV W/ CM
2 of 6 series · 14 of 46 positions shown, 16 images · IV contrast (Omnipaque or Isovue)
Comparison: CT April 27, 2021

CLINICAL DATA: Epigastric pain which has progressed to chest pain

EXAM:
CT ABDOMEN AND PELVIS WITH CONTRAST
TECHNIQUE: Multidetector CT imaging of the abdomen and pelvis was performed
using the standard protocol following bolus administration of
intravenous contrast.

[Series 3: thins · axial · 0.73mm/px · z∈[+767,+1174]mm · 11 of 694 slices shown, 13 images]
[im 56/694  soft-tissue]
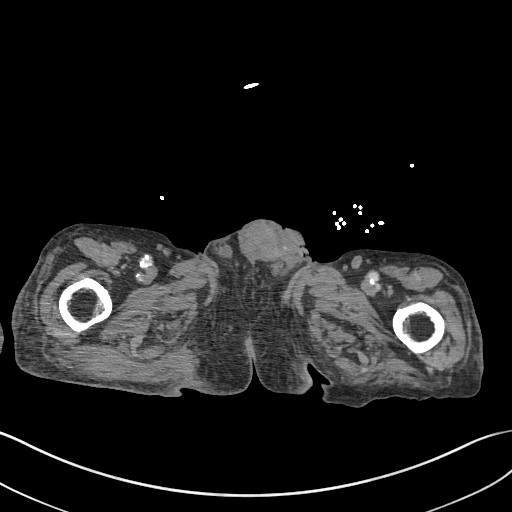
[im 56/694  bone]
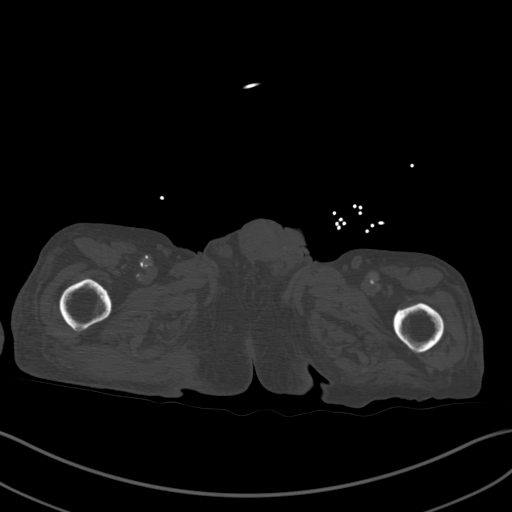
[im 111/694  soft-tissue]
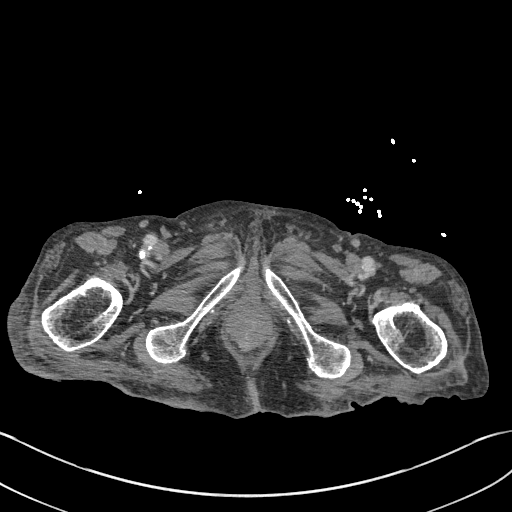
[im 167/694  soft-tissue]
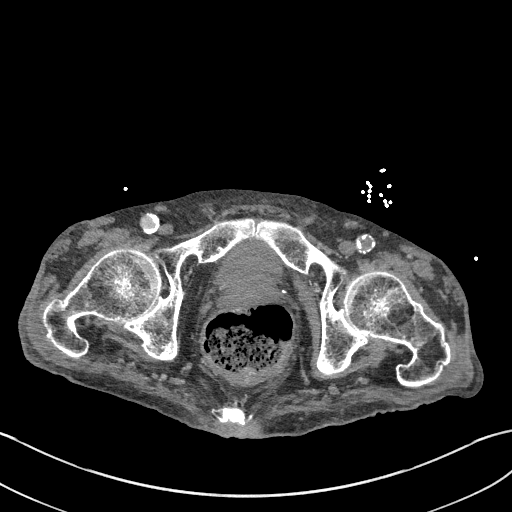
[im 222/694  soft-tissue]
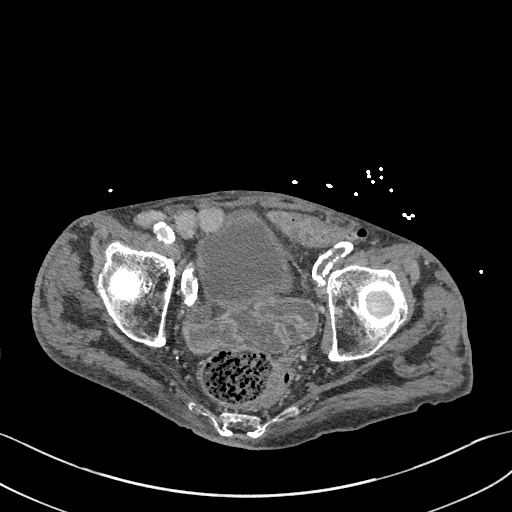
[im 278/694  soft-tissue]
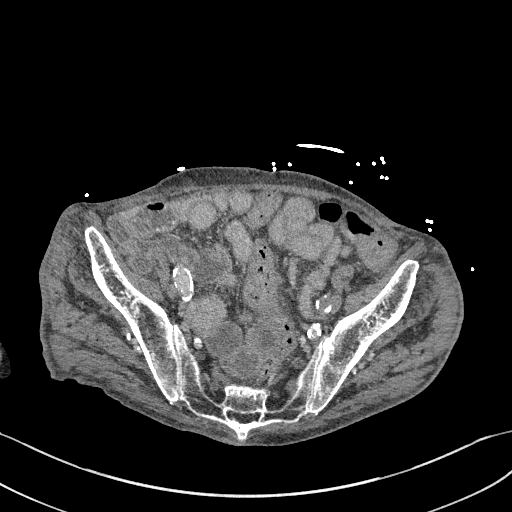
[im 361/694  soft-tissue]
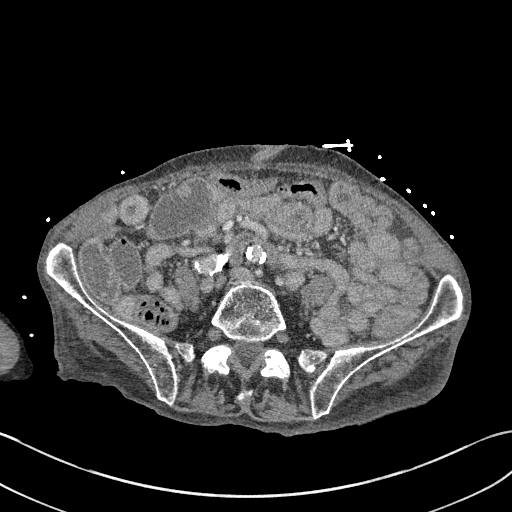
[im 416/694  soft-tissue]
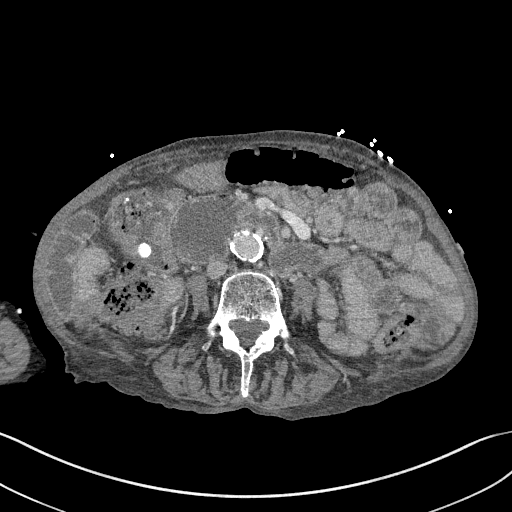
[im 472/694  soft-tissue]
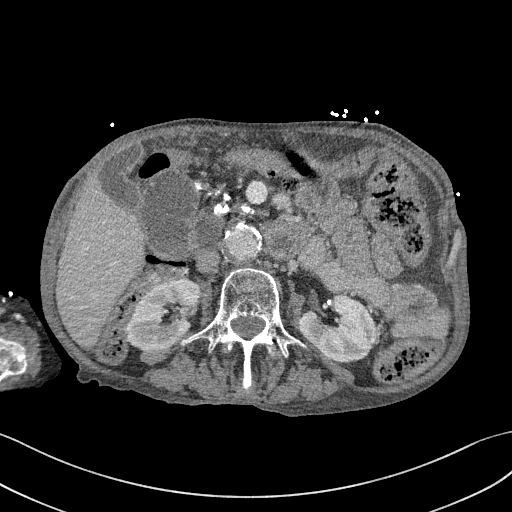
[im 527/694  soft-tissue]
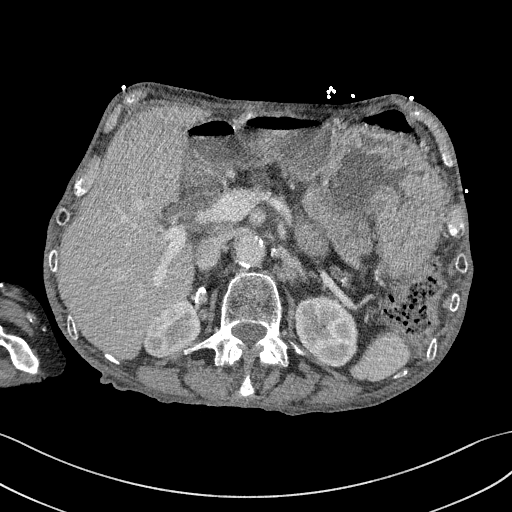
[im 527/694  bone]
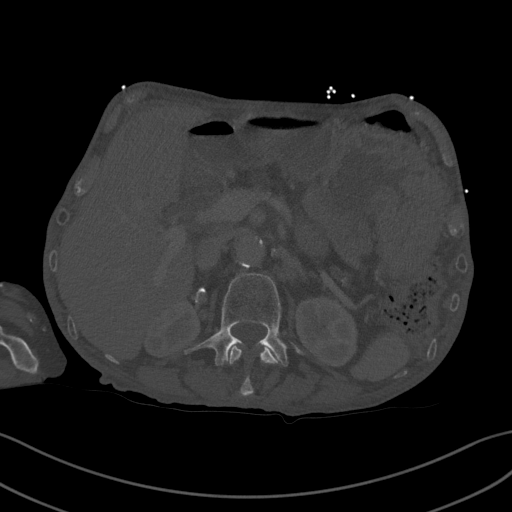
[im 583/694  soft-tissue]
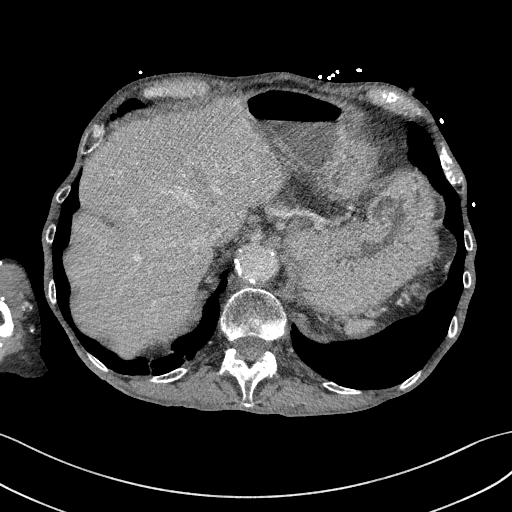
[im 638/694  soft-tissue]
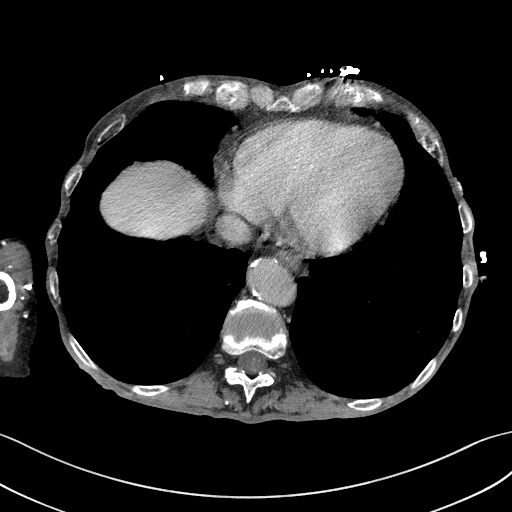

[Series 5: coronal st · coronal · 0.72mm/px · 3 of 100 slices shown]
[im 34/100  soft-tissue]
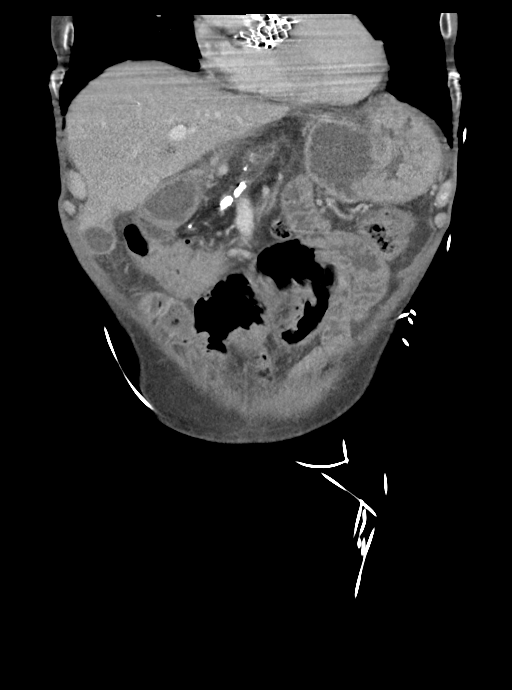
[im 45/100  soft-tissue]
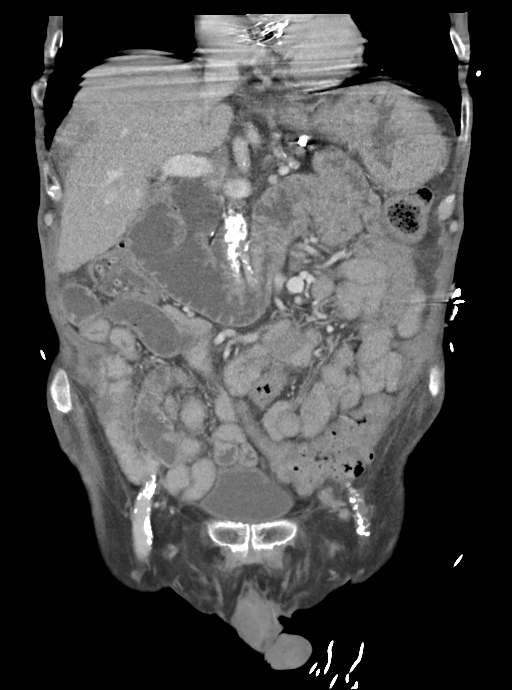
[im 56/100  soft-tissue]
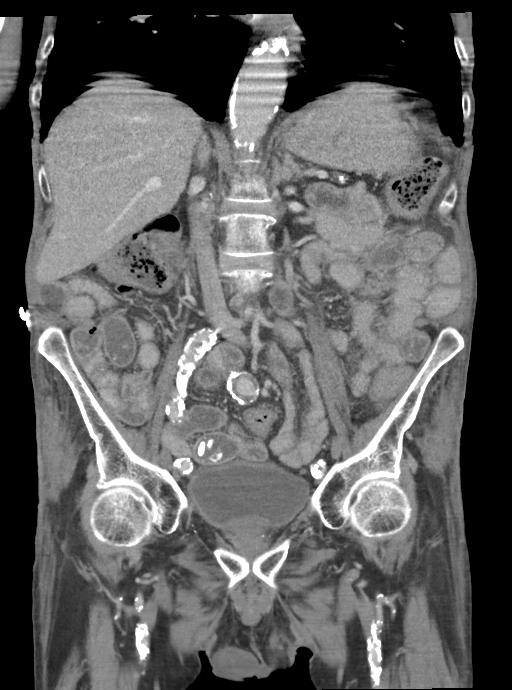

[14 of 46 positions shown; findings below may reference images not displayed]

RADIATION DOSE REDUCTION: This exam was performed according to the
departmental dose-optimization program which includes automated
exposure control, adjustment of the mA and/or kV according to
patient size and/or use of iterative reconstruction technique.

CONTRAST:  75mL OMNIPAQUE IOHEXOL 300 MG/ML  SOLN
FINDINGS: Lower chest: Aortic valve prosthesis.  No acute abnormality.

Hepatobiliary: Cyst in left lobe of the liver. Gallbladder is
unremarkable. No biliary ductal dilation.

Pancreas: Sequela of chronic pancreatitis with severe pancreatic
atrophy and coarse parenchymal calcifications. No definite evidence
of acute pancreatitis.

Spleen: No splenomegaly or focal splenic lesion.

Adrenals/Urinary Tract: Mild thickening of the bilateral adrenal
glands without discrete nodularity, favor hyperplasia. Right upper
pole renal cyst. Symmetric bilateral renal enhancement and excretion
of contrast material. Urinary bladder is unremarkable for degree of
distension.

Stomach/Bowel: No enteric contrast was administered. There is
prominent gastric fold thickening of a nondistended stomach.
Prominence of the gastric antrum, duodenal bulb and proximal
duodenum with the duodenum measuring up to 4.9 cm in diameter. There
is some tapering of the duodenum behind the SMV image 282/3 and SMA
on image [DATE] with fluid-filled loops of small bowel throughout the
remainder of the abdomen. Small to moderate volume of formed stool
throughout the colon.

Vascular/Lymphatic: Aortic and branch vessel atherosclerosis with
aneurysmal dilation of the infrarenal abdominal aorta measuring
cm. Aneurysmal dilation of the left common iliac artery measuring
2.1 cm. Occlusion of the left external and proximal common femoral
arteries with distal reconstitution is unchanged from prior.

No pathologically enlarged abdominal or pelvic lymph nodes.

Reproductive: Prostate is unremarkable.

Other: No significant abdominopelvic free fluid.

Musculoskeletal: Diffuse demineralization of bone. No acute osseous
abnormality.
IMPRESSION: 1. Prominence of the gastric antrum, duodenal bulb and proximal
duodenum with the duodenum measuring up to 4.9 cm in diameter and
mild narrowing of the duodenum behind the SMV and SMA but with
fluid-filled loops of small bowel and mild wall thickening
throughout the remainder of the abdomen as well as decompressed
gastric fundus and cardia. Findings most consistent with enteritis
without convincing evidence of mechanical obstruction.
2. Similar fold thickening of the nondistended portions of the
stomach which again may reflect gastritis, as well the possibility
of infiltrative gastric neoplasm is again not entirely excluded.
Consider further evaluation with nonemergent endoscopy if not
previously performed.
3. Sequela of chronic pancreatitis without definite evidence of
acute pancreatitis.
4. Unchanged size of the infrarenal abdominal aortic aneurysm
measuring 3.0 cm. Recommend follow-up every 3 years. Reference: [HOSPITAL] 3717;[DATE].
5. Aneurysmal dilation of the left common iliac artery measuring
cm.
6. Occlusion of the left external and proximal common femoral
arteries with distal reconstitution is unchanged from prior.
7. Aortic Atherosclerosis ([id number]).

## 2022-07-01 ENCOUNTER — Encounter (HOSPITAL_COMMUNITY)
Admission: RE | Admit: 2022-07-01 | Discharge: 2022-07-01 | Disposition: A | Discharge status: Home / Self Care | Payer: Medicare Other | Source: Ambulatory Visit | Attending: Internal Medicine | Admitting: Internal Medicine

## 2022-07-01 ENCOUNTER — Encounter (HOSPITAL_COMMUNITY): Payer: Self-pay

## 2022-07-01 DIAGNOSIS — Z01818 Encounter for other preprocedural examination: Secondary | ICD-10-CM | POA: Diagnosis present

## 2022-07-01 DIAGNOSIS — E119 Type 2 diabetes mellitus without complications: Secondary | ICD-10-CM | POA: Insufficient documentation

## 2022-07-01 HISTORY — DX: Anxiety disorder, unspecified: F41.9

## 2022-07-01 HISTORY — DX: Depression, unspecified: F32.A

## 2022-07-01 LAB — BASIC METABOLIC PANEL
Anion gap: 5 (ref 5–15)
BUN: 40 mg/dL — ABNORMAL HIGH (ref 8–23)
CO2: 26 mmol/L (ref 22–32)
Calcium: 7.8 mg/dL — ABNORMAL LOW (ref 8.9–10.3)
Chloride: 107 mmol/L (ref 98–111)
Creatinine, Ser: 1.31 mg/dL — ABNORMAL HIGH (ref 0.61–1.24)
GFR, Estimated: 57 mL/min — ABNORMAL LOW (ref >60)
Glucose, Bld: 221 mg/dL — ABNORMAL HIGH (ref 70–99)
Potassium: 4 mmol/L (ref 3.5–5.1)
Sodium: 138 mmol/L (ref 135–145)

## 2022-07-03 ENCOUNTER — Encounter (HOSPITAL_COMMUNITY)
Admission: RE | Disposition: A | Discharge status: Home / Self Care | Payer: Self-pay | Source: Ambulatory Visit | Attending: Internal Medicine

## 2022-07-03 ENCOUNTER — Encounter (HOSPITAL_COMMUNITY): Payer: Self-pay

## 2022-07-03 ENCOUNTER — Other Ambulatory Visit: Payer: Self-pay

## 2022-07-03 ENCOUNTER — Ambulatory Visit (HOSPITAL_BASED_OUTPATIENT_CLINIC_OR_DEPARTMENT_OTHER): Payer: Medicare Other | Admitting: Anesthesiology

## 2022-07-03 ENCOUNTER — Ambulatory Visit (HOSPITAL_COMMUNITY): Payer: Medicare Other | Admitting: Anesthesiology

## 2022-07-03 ENCOUNTER — Ambulatory Visit (HOSPITAL_COMMUNITY)
Admission: RE | Admit: 2022-07-03 | Discharge: 2022-07-03 | Disposition: A | Discharge status: Home / Self Care | Payer: Medicare Other | Source: Ambulatory Visit | Attending: Internal Medicine | Admitting: Internal Medicine

## 2022-07-03 DIAGNOSIS — E119 Type 2 diabetes mellitus without complications: Secondary | ICD-10-CM

## 2022-07-03 DIAGNOSIS — R1013 Epigastric pain: Secondary | ICD-10-CM | POA: Insufficient documentation

## 2022-07-03 DIAGNOSIS — E1151 Type 2 diabetes mellitus with diabetic peripheral angiopathy without gangrene: Secondary | ICD-10-CM | POA: Insufficient documentation

## 2022-07-03 DIAGNOSIS — K2289 Other specified disease of esophagus: Secondary | ICD-10-CM | POA: Diagnosis not present

## 2022-07-03 DIAGNOSIS — K2950 Unspecified chronic gastritis without bleeding: Secondary | ICD-10-CM | POA: Insufficient documentation

## 2022-07-03 DIAGNOSIS — F17200 Nicotine dependence, unspecified, uncomplicated: Secondary | ICD-10-CM

## 2022-07-03 DIAGNOSIS — J449 Chronic obstructive pulmonary disease, unspecified: Secondary | ICD-10-CM | POA: Diagnosis not present

## 2022-07-03 DIAGNOSIS — K2970 Gastritis, unspecified, without bleeding: Secondary | ICD-10-CM

## 2022-07-03 DIAGNOSIS — K449 Diaphragmatic hernia without obstruction or gangrene: Secondary | ICD-10-CM | POA: Insufficient documentation

## 2022-07-03 DIAGNOSIS — K3189 Other diseases of stomach and duodenum: Secondary | ICD-10-CM | POA: Diagnosis not present

## 2022-07-03 DIAGNOSIS — F17210 Nicotine dependence, cigarettes, uncomplicated: Secondary | ICD-10-CM | POA: Diagnosis not present

## 2022-07-03 DIAGNOSIS — K219 Gastro-esophageal reflux disease without esophagitis: Secondary | ICD-10-CM | POA: Insufficient documentation

## 2022-07-03 DIAGNOSIS — R933 Abnormal findings on diagnostic imaging of other parts of digestive tract: Secondary | ICD-10-CM | POA: Insufficient documentation

## 2022-07-03 DIAGNOSIS — K056 Periodontal disease, unspecified: Secondary | ICD-10-CM

## 2022-07-03 DIAGNOSIS — Z7984 Long term (current) use of oral hypoglycemic drugs: Secondary | ICD-10-CM | POA: Insufficient documentation

## 2022-07-03 DIAGNOSIS — Z952 Presence of prosthetic heart valve: Secondary | ICD-10-CM

## 2022-07-03 DIAGNOSIS — K083 Retained dental root: Secondary | ICD-10-CM

## 2022-07-03 DIAGNOSIS — H409 Unspecified glaucoma: Secondary | ICD-10-CM

## 2022-07-03 DIAGNOSIS — I350 Nonrheumatic aortic (valve) stenosis: Secondary | ICD-10-CM

## 2022-07-03 DIAGNOSIS — I10 Essential (primary) hypertension: Secondary | ICD-10-CM | POA: Insufficient documentation

## 2022-07-03 DIAGNOSIS — R948 Abnormal results of function studies of other organs and systems: Secondary | ICD-10-CM | POA: Diagnosis present

## 2022-07-03 DIAGNOSIS — I739 Peripheral vascular disease, unspecified: Secondary | ICD-10-CM

## 2022-07-03 HISTORY — PX: BIOPSY: SHX5522

## 2022-07-03 HISTORY — PX: ESOPHAGOGASTRODUODENOSCOPY (EGD) WITH PROPOFOL: SHX5813

## 2022-07-03 LAB — GLUCOSE, CAPILLARY
Glucose-Capillary: 78 mg/dL (ref 70–99)
Glucose-Capillary: 136 mg/dL — ABNORMAL HIGH (ref 70–99)

## 2022-07-03 SURGERY — ESOPHAGOGASTRODUODENOSCOPY (EGD) WITH PROPOFOL
Anesthesia: General

## 2022-07-03 MED ORDER — LACTATED RINGERS IV SOLN: INTRAVENOUS | Status: DC

## 2022-07-03 MED ORDER — LIDOCAINE HCL (CARDIAC) PF 100 MG/5ML IV SOSY
PREFILLED_SYRINGE | INTRAVENOUS | Status: DC | PRN
  Administered 2022-07-03: 50 mg via INTRAVENOUS

## 2022-07-03 MED ORDER — PROPOFOL 10 MG/ML IV BOLUS
INTRAVENOUS | Status: DC | PRN
  Administered 2022-07-03: 100 mg via INTRAVENOUS
  Administered 2022-07-03 (×2): 30 mg via INTRAVENOUS
  Administered 2022-07-03: 40 mg via INTRAVENOUS

## 2022-07-03 MED ORDER — DEXTROSE 50 % IV SOLN
INTRAVENOUS | Status: AC
  Administered 2022-07-03: 25 mL
  Filled 2022-07-03: qty 50

## 2022-07-03 MED ORDER — DEXTROSE 50 % IV SOLN: 25.0000 mL | Freq: Once | INTRAVENOUS | Status: DC

## 2022-07-03 MED ORDER — PHENYLEPHRINE 80 MCG/ML (10ML) SYRINGE FOR IV PUSH (FOR BLOOD PRESSURE SUPPORT)
PREFILLED_SYRINGE | INTRAVENOUS | Status: DC | PRN
  Administered 2022-07-03 (×2): 80 ug via INTRAVENOUS

## 2022-07-03 NOTE — Anesthesia Procedure Notes (Signed)
Date/Time: 07/03/2022 9:49 AM  Performed by: Orlie Dakin, CRNAPre-anesthesia Checklist: Patient identified, Emergency Drugs available, Suction available and Patient being monitored Patient Re-evaluated:Patient Re-evaluated prior to induction Oxygen Delivery Method: Nasal cannula Induction Type: IV induction Placement Confirmation: positive ETCO2

## 2022-07-03 NOTE — Anesthesia Preprocedure Evaluation (Signed)
Anesthesia Evaluation  Patient identified by MRN, date of birth, ID band Patient awake    Reviewed: Allergy & Precautions, NPO status , Patient's Chart, lab work & pertinent test results  Airway Mallampati: II  TM Distance: >3 FB Neck ROM: Full    Dental  (+) Edentulous Upper, Edentulous Lower   Pulmonary shortness of breath and with exertion, pneumonia, resolved, COPD, Current Smoker,    Pulmonary exam normal breath sounds clear to auscultation       Cardiovascular hypertension, Pt. on medications + Peripheral Vascular Disease  Normal cardiovascular exam+ Valvular Problems/Murmurs (TAVR)  Rhythm:Regular Rate:Normal     Neuro/Psych PSYCHIATRIC DISORDERS Anxiety Depression negative neurological ROS     GI/Hepatic Neg liver ROS, hiatal hernia, GERD  Medicated,  Endo/Other  diabetes, Well Controlled, Type 2, Oral Hypoglycemic Agents  Renal/GU negative Renal ROS  negative genitourinary   Musculoskeletal  (+) Arthritis , Osteoarthritis,    Abdominal   Peds negative pediatric ROS (+)  Hematology negative hematology ROS (+)   Anesthesia Other Findings   Reproductive/Obstetrics negative OB ROS                             Anesthesia Physical Anesthesia Plan  ASA: 3  Anesthesia Plan: General   Post-op Pain Management:    Induction: Intravenous  PONV Risk Score and Plan:   Airway Management Planned: Nasal Cannula and Natural Airway  Additional Equipment:   Intra-op Plan:   Post-operative Plan:   Informed Consent: I have reviewed the patients History and Physical, chart, labs and discussed the procedure including the risks, benefits and alternatives for the proposed anesthesia with the patient or authorized representative who has indicated his/her understanding and acceptance.       Plan Discussed with: CRNA and Surgeon  Anesthesia Plan Comments:         Anesthesia Quick  Evaluation

## 2022-07-03 NOTE — Discharge Instructions (Signed)
EGD Discharge instructions Please read the instructions outlined below and refer to this sheet in the next few weeks. These discharge instructions provide you with general information on caring for yourself after you leave the hospital. Your doctor may also give you specific instructions. While your treatment has been planned according to the most current medical practices available, unavoidable complications occasionally occur. If you have any problems or questions after discharge, please call your doctor. ACTIVITY You may resume your regular activity but move at a slower pace for the next 24 hours.  Take frequent rest periods for the next 24 hours.  Walking will help expel (get rid of) the air and reduce the bloated feeling in your abdomen.  No driving for 24 hours (because of the anesthesia (medicine) used during the test).  You may shower.  Do not sign any important legal documents or operate any machinery for 24 hours (because of the anesthesia used during the test).  NUTRITION Drink plenty of fluids.  You may resume your normal diet.  Begin with a light meal and progress to your normal diet.  Avoid alcoholic beverages for 24 hours or as instructed by your caregiver.  MEDICATIONS You may resume your normal medications unless your caregiver tells you otherwise.  WHAT YOU CAN EXPECT TODAY You may experience abdominal discomfort such as a feeling of fullness or "gas" pains.  FOLLOW-UP Your doctor will discuss the results of your test with you.  SEEK IMMEDIATE MEDICAL ATTENTION IF ANY OF THE FOLLOWING OCCUR: Excessive nausea (feeling sick to your stomach) and/or vomiting.  Severe abdominal pain and distention (swelling).  Trouble swallowing.  Temperature over 101 F (37.8 C).  Rectal bleeding or vomiting of blood.    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  I took extensive biopsies of your esophagus.  You had inflammation in your stomach as well as  your small bowel and I biopsied both these areas as well.  Await pathology results, my office will contact you.  Continue on pantoprazole daily.  Follow-up with GI in 3 to 4 months.  I hope you have a great rest of your week!  Elon Alas. Abbey Chatters, D.O. Gastroenterology and Hepatology [facility]

## 2022-07-03 NOTE — Op Note (Signed)
[facility] Patient Name: Patrick Beck Procedure Date: 07/03/2022 9:36 AM MRN: [id number] Date of Birth: 1946/12/06 Attending MD: Elon Alas. Abbey Chatters DO CSN: [id number] Age: 76 Admit Type: Outpatient Procedure:                Upper GI endoscopy Indications:              Epigastric abdominal pain, Heartburn, Abnormal CT                            of the GI tract Providers:                Elon Alas. Abbey Chatters, DO, Caprice Kluver, Hendricks Limes., Technician Referring MD:              Medicines:                See the Anesthesia note for documentation of the                            administered medications Complications:            No immediate complications. Estimated Blood Loss:     Estimated blood loss was minimal. Procedure:                Pre-Anesthesia Assessment:                           - The anesthesia plan was to use monitored                            anesthesia care (MAC).                           After obtaining informed consent, the endoscope was                            passed under direct vision. Throughout the                            procedure, the patient's blood pressure, pulse, and                            oxygen saturations were monitored continuously. The                            [id number]-[id number]) scope was introduced through                            the mouth, and advanced to the second part of                            duodenum. The upper GI endoscopy was accomplished                            without difficulty. The  patient tolerated the                            procedure well. Scope In: 9:46:45 AM Scope Out: 9:59:05 AM Total Procedure Duration: 0 hours 12 minutes 20 seconds  Findings:      A 2 cm hiatal hernia was present.      The esophagus and gastroesophageal junction were examined with white       light and narrow band imaging (NBI) from a forward view and retroflexed       position.  There were esophageal mucosal changes suspicious for       long-segment Barrett's esophagus. These changes involved the mucosa at       the upper extent of the gastric folds (36 cm from the incisors)       extending to the Z-line (30 cm from the incisors). Circumferential       salmon-colored mucosa was present from 30 to 35 cm and two tongues of       salmon-colored mucosa were present from 35 to 36 cm. The maximum       longitudinal extent of these esophageal mucosal changes was 6 cm in       length. Mucosa was biopsied with a cold forceps for histology in 4       quadrants at intervals of 1 cm. A total of 7 specimen bottles were sent       to pathology.      Patchy mild inflammation characterized by erythema was found in the       entire examined stomach. Biopsies were taken with a cold forceps for       Helicobacter pylori testing.      Localized nodular mucosa was found in the duodenal bulb. Biopsies were       taken with a cold forceps for histology. Impression:               - 2 cm hiatal hernia.                           - Esophageal mucosal changes suspicious for                            long-segment Barrett's esophagus. Biopsied.                           - Gastritis. Biopsied.                           - Nodular mucosa in the duodenal bulb. Biopsied. Moderate Sedation:      Per Anesthesia Care Recommendation:           - Patient has a contact number available for                            emergencies. The signs and symptoms of potential                            delayed complications were discussed with the                            patient. Return to  normal activities tomorrow.                            Written discharge instructions were provided to the                            patient.                           - Resume previous diet.                           - Continue present medications.                           - Await pathology results.                            - Repeat upper endoscopy in 3 years for                            surveillance.                           - Use Protonix (pantoprazole) 40 mg PO daily.                           - Return to GI clinic in 4 months. Procedure Code(s):        --- Professional ---                           [phone], Esophagogastroduodenoscopy, flexible,                            transoral; with biopsy, single or multiple Diagnosis Code(s):        --- Professional ---                           K44.9, Diaphragmatic hernia without obstruction or                            gangrene                           K22.8, Other specified diseases of esophagus                           K29.70, Gastritis, unspecified, without bleeding                           K31.89, Other diseases of stomach and duodenum                           R10.13, Epigastric pain                           R12, Heartburn  R93.3, Abnormal findings on diagnostic imaging of                            other parts of digestive tract CPT copyright 2019 American [employer] Association. All rights reserved. The codes documented in this report are preliminary and upon coder review may  be revised to meet current compliance requirements. Elon Alas. Abbey Chatters, DO [place] [facility]aje Kinker, DO 07/03/2022 10:02:35 AM This report has been signed electronically. Number of Addenda: 0

## 2022-07-03 NOTE — H&P (Signed)
Primary Care Physician:  Lemmie Evens, MD Primary Gastroenterologist:  Dr. Abbey Chatters  Pre-Procedure History & Physical: HPI:  Patrick Beck is a 76 y.o. male is here for an EGD to be performed for abdominal pain, abnormal CT of stomach and duodenum.   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 Angelena Form & Dr. Cyndia Bent   Severe aortic stenosis    Type 2 diabetes mellitus [facility])     Past Surgical History:  Procedure Laterality Date   CARDIAC CATHETERIZATION     COLONOSCOPY N/A 04/06/2013   Procedure: COLONOSCOPY;  Surgeon: Jamesetta So, MD;  [facility];  Service: Gastroenterology;  Laterality: N/A;   EYE SURGERY Left    laser sx fr glaucoma   HERNIA REPAIR     Three prior surgeries before 1966   MULTIPLE EXTRACTIONS WITH ALVEOLOPLASTY N/A 05/24/2021   Procedure: EXTRACTION OF TEETH NUMBER TWO, THREE, FIVE, SIX, SEVEN, EIGHT, NINE, TEN, ELEVEN, TWELVE, SEVENTEEN, EIGHTTEEN, NINETEEN, TWENTY, TWENTY-ONE, TWENTY- TWO, TWENTY-FOUR, THWENTY-SEVEN, TWENTY-EIGHT, TWENTY-NINE WITH ALVEOLOPLASTY IN ALL FOUR QUADRANTS;  Surgeon: [name], DMD;  Location: [place];  Service: Dentistry;  Laterality: N/A;   RIGHT/LEFT HEART CATH AND CORONARY ANGIOGRAPHY N/A 04/18/2021   Procedure: RIGHT/LEFT HEART CATH AND CORONARY ANGIOGRAPHY;  Surgeon: Burnell Blanks, MD;  Location: [place] [facility];  Service: Cardiovascular;  Laterality: N/A;   TEE  WITHOUT CARDIOVERSION N/A 06/26/2021   Procedure: TRANSESOPHAGEAL ECHOCARDIOGRAM (TEE);  Surgeon: Burnell Blanks, MD;  Location: [place];  Service: Open Heart Surgery;  Laterality: N/A;   TONSILLECTOMY     at age 66    Prior to Admission medications   Medication Sig Start Date End Date Taking? Authorizing Provider  aspirin 81 MG chewable tablet Chew 1 tablet (81 mg total) by mouth daily. Can swallow whole 06/28/21  Yes Eileen Stanford, PA-C  brimonidine (ALPHAGAN) 0.2 % ophthalmic solution Place 1 drop into both eyes 3 (three) times daily. 05/23/20  Yes [provider]  diphenhydrAMINE (BENADRYL) 25 MG tablet Take 25 mg by mouth daily as needed for allergies.   Yes [provider]  furosemide (LASIX) 40 MG tablet Take 40 mg by mouth in the morning.   Yes [provider]  glimepiride (AMARYL) 1 MG tablet Take 0.5 mg by mouth in the morning. 05/13/22  Yes [provider]  hydrochlorothiazide (HYDRODIURIL) 25 MG tablet Take 25 mg by mouth in the morning. 03/04/22  Yes [provider]  HYDROcodone-acetaminophen (NORCO) 7.5-325 MG tablet Take 1 tablet by mouth 4 (four) times daily as needed for pain. 06/04/22  Yes [provider]  hydrocortisone cream 1 % Apply 1 application topically 2 (two) times daily as needed (bug bites).   Yes [provider]  ibuprofen (ADVIL) 200 MG tablet Take 800 mg by mouth every 8 (eight) hours as needed (  pain.).   Yes [provider]  latanoprost (XALATAN) 0.005 % ophthalmic solution Place 1 drop into both eyes at bedtime. 10/14/17  Yes [provider]  magnesium oxide (MAG-OX) 400 (240 Mg) MG tablet Take 400 mg by mouth in the morning.   Yes [provider]  Melatonin 10 MG CAPS Take 20-30 mg by mouth at bedtime as needed (sleep).   Yes [provider]  metFORMIN (GLUCOPHAGE) 1000 MG tablet Take 1,000 mg by mouth 2 (two) times daily with a meal.   Yes [provider]  Multiple Vitamin (MULTIVITAMIN WITH MINERALS) TABS tablet Take 1 tablet by mouth daily with supper.   Yes [provider]  neomycin-bacitracin-polymyxin (NEOSPORIN) ointment Apply 1 application topically as needed for wound care.   Yes [provider]  pantoprazole (PROTONIX) 40 MG tablet TAKE 1 TABLET BY MOUTH EVERY DAY 03/29/22  Yes Eileen Stanford, PA-C  pravastatin (PRAVACHOL) 40 MG tablet Take 40 mg by mouth every evening.   Yes [provider]  spironolactone (ALDACTONE) 25 MG tablet Take 25 mg by mouth in the morning.   Yes [provider]  timolol (TIMOPTIC) 0.5 % ophthalmic solution Place 1 drop into both eyes every evening. 08/16/21  Yes [provider]    Allergies as of 05/21/2022 - Review Complete 05/14/2022  Allergen Reaction Noted   Macadamia nut oil Anaphylaxis 03/25/2013    Family History  Problem Relation Age of Onset   Aortic stenosis Father    COPD Mother    Cancer Neg Hx     Social History   Socioeconomic History   Marital status: Divorced    Spouse name: Not on file   Number of children: 0   Years of education: Not on file   Highest education level: Not on file  Occupational History   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  Tobacco Use   Smoking status: Heavy Smoker    Packs/day: 2.00    Years: 56.00    Total pack years: 112.00    Types: Cigarettes   Smokeless tobacco: Never   Tobacco comments:    pt refuses hand outs   Vaping Use   Vaping Use: Never used  Substance and Sexual Activity   Alcohol use: No    Comment: 07/1999   Drug use: No   Sexual activity: Not on file  Other Topics Concern   Not on file  Social History Narrative   Not on file   Social Determinants of Health   Financial Resource Strain: Not on file  Food Insecurity: Not on file  Transportation Needs: Not on file  Physical Activity: Not on file  Stress: Not on file  Social Connections: Not on file   Intimate Partner Violence: Not on file    Review of Systems: General: Negative for fever, chills, fatigue, weakness. Eyes: Negative for vision changes.  ENT: Negative for hoarseness, difficulty swallowing , nasal congestion. CV: Negative for chest pain, angina, palpitations, dyspnea on exertion, peripheral edema.  Respiratory: Negative for dyspnea at rest, dyspnea on exertion, cough, sputum, wheezing.  GI: See history of present illness. GU:  Negative for dysuria, hematuria, urinary incontinence, urinary frequency, nocturnal urination.  MS: Negative for joint pain, low back pain.  Derm: Negative for rash or itching.  Neuro: Negative for weakness, abnormal sensation, seizure, frequent headaches, memory loss, confusion.  Psych: Negative for anxiety, depression Endo: Negative for unusual weight change.  Heme: Negative for bruising or bleeding. Allergy: Negative for rash or hives.  Physical Exam: Vital signs in last 24 hours:     General:   Alert,  Well-developed, well-nourished, pleasant and cooperative in NAD Head:  Normocephalic and atraumatic. Eyes:  Sclera clear, no icterus.   Conjunctiva pink. Ears:  Normal auditory acuity. Nose:  No deformity, discharge,  or lesions. Mouth:  No deformity or lesions, dentition normal. Neck:  Supple; no masses or thyromegaly. Lungs:  Clear throughout to auscultation.   No wheezes, crackles, or rhonchi. No acute distress. Heart:  Regular rate and rhythm; no murmurs, clicks, rubs,  or gallops. Abdomen:  Soft, nontender and nondistended. No masses, hepatosplenomegaly or hernias noted. Normal bowel sounds, without guarding, and without rebound.   Msk:  Symmetrical without gross deformities. Normal posture. Extremities:  Without clubbing or edema. Neurologic:  Alert and  oriented x4;  grossly normal neurologically. Skin:  Intact without significant lesions or rashes. Cervical Nodes:  No significant cervical adenopathy. Psych:  Alert and  cooperative. Normal mood and affect.   Impression/Plan: Ladona Mow is here for an EGD to be performed for abdominal pain, abnormal CT of stomach and duodenum.   Risks, benefits, limitations, imponderables and alternatives regarding EGD have been reviewed with the patient. Questions have been answered. All parties agreeable.

## 2022-07-03 NOTE — Transfer of Care (Signed)
Immediate Anesthesia Transfer of Care Note  Patient: Patrick Beck  Procedure(s) Performed: ESOPHAGOGASTRODUODENOSCOPY (EGD) WITH PROPOFOL BIOPSY  Patient Location: Short Stay  Anesthesia Type:General  Level of Consciousness: drowsy  Airway & Oxygen Therapy: Patient Spontanous Breathing  Post-op Assessment: Report given to RN and Post -op Vital signs reviewed and stable  Post vital signs: Reviewed and stable  Last Vitals:  Vitals Value Taken Time  BP    Temp    Pulse    Resp    SpO2      Last Pain:  Vitals:   07/03/22 0917  TempSrc: Oral  PainSc: 0-No pain         Complications: No notable events documented.

## 2022-07-03 NOTE — Anesthesia Postprocedure Evaluation (Signed)
Anesthesia Post Note  Patient: Patrick Beck  Procedure(s) Performed: ESOPHAGOGASTRODUODENOSCOPY (EGD) WITH PROPOFOL BIOPSY  Patient location during evaluation: Phase II Anesthesia Type: General Level of consciousness: awake and alert and oriented Pain management: pain level controlled Vital Signs Assessment: post-procedure vital signs reviewed and stable Respiratory status: spontaneous breathing, nonlabored ventilation and respiratory function stable Cardiovascular status: blood pressure returned to baseline and stable Postop Assessment: no apparent nausea or vomiting Anesthetic complications: no   No notable events documented.   Last Vitals:  Vitals:   07/03/22 0917 07/03/22 1009  BP: 115/73 (!) 105/58  Pulse: 80 79  Resp: (!) 23 (!) 23  Temp: 36.4 C 36.6 C  SpO2: 99% 95%    Last Pain:  Vitals:   07/03/22 1009  TempSrc: Oral  PainSc: 0-No pain                 Carneshia Raker C Anajah Sterbenz

## 2022-07-04 LAB — SURGICAL PATHOLOGY

## 2022-07-09 ENCOUNTER — Encounter (HOSPITAL_COMMUNITY): Payer: Self-pay | Admitting: Internal Medicine

## 2022-07-18 ENCOUNTER — Encounter: Payer: Self-pay | Admitting: *Deleted

## 2022-07-29 ENCOUNTER — Emergency Department (HOSPITAL_COMMUNITY): Payer: Medicare Other

## 2022-07-29 ENCOUNTER — Inpatient Hospital Stay (HOSPITAL_COMMUNITY)
Admission: EM | Admit: 2022-07-29 | Discharge: 2022-08-04 | DRG: 872 | Disposition: A | Discharge status: Skilled Nursing Facility | Payer: Medicare Other | Attending: Family Medicine | Admitting: Family Medicine

## 2022-07-29 ENCOUNTER — Other Ambulatory Visit: Payer: Self-pay

## 2022-07-29 ENCOUNTER — Encounter (HOSPITAL_COMMUNITY): Payer: Self-pay

## 2022-07-29 DIAGNOSIS — E440 Moderate protein-calorie malnutrition: Secondary | ICD-10-CM | POA: Diagnosis present

## 2022-07-29 DIAGNOSIS — S80921A Unspecified superficial injury of right lower leg, initial encounter: Secondary | ICD-10-CM | POA: Diagnosis present

## 2022-07-29 DIAGNOSIS — R791 Abnormal coagulation profile: Secondary | ICD-10-CM

## 2022-07-29 DIAGNOSIS — E1165 Type 2 diabetes mellitus with hyperglycemia: Secondary | ICD-10-CM

## 2022-07-29 DIAGNOSIS — L03114 Cellulitis of left upper limb: Secondary | ICD-10-CM

## 2022-07-29 DIAGNOSIS — K219 Gastro-esophageal reflux disease without esophagitis: Secondary | ICD-10-CM

## 2022-07-29 DIAGNOSIS — E46 Unspecified protein-calorie malnutrition: Secondary | ICD-10-CM | POA: Diagnosis not present

## 2022-07-29 DIAGNOSIS — R6520 Severe sepsis without septic shock: Secondary | ICD-10-CM

## 2022-07-29 DIAGNOSIS — J449 Chronic obstructive pulmonary disease, unspecified: Secondary | ICD-10-CM | POA: Diagnosis present

## 2022-07-29 DIAGNOSIS — Z79899 Other long term (current) drug therapy: Secondary | ICD-10-CM

## 2022-07-29 DIAGNOSIS — E782 Mixed hyperlipidemia: Secondary | ICD-10-CM

## 2022-07-29 DIAGNOSIS — L89152 Pressure ulcer of sacral region, stage 2: Secondary | ICD-10-CM | POA: Diagnosis present

## 2022-07-29 DIAGNOSIS — Z825 Family history of asthma and other chronic lower respiratory diseases: Secondary | ICD-10-CM

## 2022-07-29 DIAGNOSIS — Z681 Body mass index (BMI) 19 or less, adult: Secondary | ICD-10-CM

## 2022-07-29 DIAGNOSIS — Z953 Presence of xenogenic heart valve: Secondary | ICD-10-CM | POA: Diagnosis not present

## 2022-07-29 DIAGNOSIS — A419 Sepsis, unspecified organism: Secondary | ICD-10-CM | POA: Diagnosis present

## 2022-07-29 DIAGNOSIS — X58XXXA Exposure to other specified factors, initial encounter: Secondary | ICD-10-CM | POA: Diagnosis present

## 2022-07-29 DIAGNOSIS — E875 Hyperkalemia: Secondary | ICD-10-CM

## 2022-07-29 DIAGNOSIS — H409 Unspecified glaucoma: Secondary | ICD-10-CM | POA: Diagnosis present

## 2022-07-29 DIAGNOSIS — L8990 Pressure ulcer of unspecified site, unspecified stage: Secondary | ICD-10-CM | POA: Insufficient documentation

## 2022-07-29 DIAGNOSIS — I10 Essential (primary) hypertension: Secondary | ICD-10-CM

## 2022-07-29 DIAGNOSIS — I2510 Atherosclerotic heart disease of native coronary artery without angina pectoris: Secondary | ICD-10-CM | POA: Diagnosis present

## 2022-07-29 DIAGNOSIS — E860 Dehydration: Secondary | ICD-10-CM

## 2022-07-29 DIAGNOSIS — Z91018 Allergy to other foods: Secondary | ICD-10-CM | POA: Diagnosis not present

## 2022-07-29 DIAGNOSIS — G4700 Insomnia, unspecified: Secondary | ICD-10-CM

## 2022-07-29 DIAGNOSIS — Z7982 Long term (current) use of aspirin: Secondary | ICD-10-CM

## 2022-07-29 DIAGNOSIS — E8809 Other disorders of plasma-protein metabolism, not elsewhere classified: Secondary | ICD-10-CM

## 2022-07-29 DIAGNOSIS — E1151 Type 2 diabetes mellitus with diabetic peripheral angiopathy without gangrene: Secondary | ICD-10-CM | POA: Diagnosis present

## 2022-07-29 DIAGNOSIS — E872 Acidosis, unspecified: Secondary | ICD-10-CM

## 2022-07-29 DIAGNOSIS — I5032 Chronic diastolic (congestive) heart failure: Secondary | ICD-10-CM

## 2022-07-29 DIAGNOSIS — Z7984 Long term (current) use of oral hypoglycemic drugs: Secondary | ICD-10-CM

## 2022-07-29 DIAGNOSIS — E1140 Type 2 diabetes mellitus with diabetic neuropathy, unspecified: Secondary | ICD-10-CM | POA: Diagnosis present

## 2022-07-29 DIAGNOSIS — F17210 Nicotine dependence, cigarettes, uncomplicated: Secondary | ICD-10-CM | POA: Diagnosis present

## 2022-07-29 DIAGNOSIS — Z20822 Contact with and (suspected) exposure to covid-19: Secondary | ICD-10-CM | POA: Diagnosis present

## 2022-07-29 DIAGNOSIS — N179 Acute kidney failure, unspecified: Secondary | ICD-10-CM | POA: Diagnosis present

## 2022-07-29 DIAGNOSIS — I110 Hypertensive heart disease with heart failure: Secondary | ICD-10-CM | POA: Diagnosis present

## 2022-07-29 DIAGNOSIS — S80922A Unspecified superficial injury of left lower leg, initial encounter: Secondary | ICD-10-CM | POA: Diagnosis present

## 2022-07-29 LAB — URINALYSIS, ROUTINE W REFLEX MICROSCOPIC
Bilirubin Urine: NEGATIVE
Glucose, UA: NEGATIVE mg/dL
Hgb urine dipstick: NEGATIVE
Ketones, ur: NEGATIVE mg/dL
Leukocytes,Ua: NEGATIVE
Nitrite: NEGATIVE
Protein, ur: NEGATIVE mg/dL
Specific Gravity, Urine: 1.01 (ref 1.005–1.030)
pH: 5 (ref 5.0–8.0)

## 2022-07-29 LAB — CBC WITH DIFFERENTIAL/PLATELET
Abs Immature Granulocytes: 0.01 10*3/uL (ref 0.00–0.07)
Basophils Absolute: 0 10*3/uL (ref 0.0–0.1)
Basophils Relative: 0 %
Eosinophils Absolute: 0 10*3/uL (ref 0.0–0.5)
Eosinophils Relative: 0 %
HCT: 41.7 % (ref 39.0–52.0)
Hemoglobin: 13.4 g/dL (ref 13.0–17.0)
Immature Granulocytes: 0 %
Lymphocytes Relative: 16 %
Lymphs Abs: 1 10*3/uL (ref 0.7–4.0)
MCH: 31.8 pg (ref 26.0–34.0)
MCHC: 32.1 g/dL (ref 30.0–36.0)
MCV: 98.8 fL (ref 80.0–100.0)
Monocytes Absolute: 0.5 10*3/uL (ref 0.1–1.0)
Monocytes Relative: 7 %
Neutro Abs: 4.9 10*3/uL (ref 1.7–7.7)
Neutrophils Relative %: 77 %
Platelets: 162 10*3/uL (ref 150–400)
RBC: 4.22 MIL/uL (ref 4.22–5.81)
RDW: 15.3 % (ref 11.5–15.5)
WBC: 6.4 10*3/uL (ref 4.0–10.5)
nRBC: 0 % (ref 0.0–0.2)

## 2022-07-29 LAB — COMPREHENSIVE METABOLIC PANEL
ALT: 17 U/L (ref 0–44)
AST: 22 U/L (ref 15–41)
Albumin: 2.4 g/dL — ABNORMAL LOW (ref 3.5–5.0)
Alkaline Phosphatase: 91 U/L (ref 38–126)
Anion gap: 11 (ref 5–15)
BUN: 30 mg/dL — ABNORMAL HIGH (ref 8–23)
CO2: 22 mmol/L (ref 22–32)
Calcium: 7.7 mg/dL — ABNORMAL LOW (ref 8.9–10.3)
Chloride: 103 mmol/L (ref 98–111)
Creatinine, Ser: 1.27 mg/dL — ABNORMAL HIGH (ref 0.61–1.24)
GFR, Estimated: 59 mL/min — ABNORMAL LOW (ref >60)
Glucose, Bld: 148 mg/dL — ABNORMAL HIGH (ref 70–99)
Potassium: 5.3 mmol/L — ABNORMAL HIGH (ref 3.5–5.1)
Sodium: 136 mmol/L (ref 135–145)
Total Bilirubin: 0.9 mg/dL (ref 0.3–1.2)
Total Protein: 5.1 g/dL — ABNORMAL LOW (ref 6.5–8.1)

## 2022-07-29 LAB — PROTIME-INR
INR: 7.4 (ref 0.8–1.2)
Prothrombin Time: 62.3 seconds — ABNORMAL HIGH (ref 11.4–15.2)

## 2022-07-29 LAB — RESP PANEL BY RT-PCR (FLU A&B, COVID) ARPGX2
Influenza A by PCR: NEGATIVE
Influenza B by PCR: NEGATIVE
SARS Coronavirus 2 by RT PCR: NEGATIVE

## 2022-07-29 LAB — GLUCOSE, CAPILLARY: Glucose-Capillary: 200 mg/dL — ABNORMAL HIGH (ref 70–99)

## 2022-07-29 LAB — LACTIC ACID, PLASMA
Lactic Acid, Venous: 4.9 mmol/L (ref 0.5–1.9)
Lactic Acid, Venous: 5.8 mmol/L (ref 0.5–1.9)

## 2022-07-29 LAB — APTT: aPTT: 72 seconds — ABNORMAL HIGH (ref 24–36)

## 2022-07-29 MED ORDER — SODIUM CHLORIDE 0.9 % IV BOLUS
1000.0000 mL | Freq: Once | INTRAVENOUS | Status: AC
  Administered 2022-07-29: 1000 mL via INTRAVENOUS

## 2022-07-29 MED ORDER — SODIUM CHLORIDE 0.9 % IV SOLN
1.0000 g | Freq: Two times a day (BID) | INTRAVENOUS | Status: DC
  Administered 2022-07-30: 1 g via INTRAVENOUS
  Filled 2022-07-29 (×4): qty 10

## 2022-07-29 MED ORDER — LACTATED RINGERS IV BOLUS
1000.0000 mL | Freq: Once | INTRAVENOUS | Status: AC
  Administered 2022-07-29: 1000 mL via INTRAVENOUS

## 2022-07-29 MED ORDER — ACETAMINOPHEN 650 MG RE SUPP: 650.0000 mg | Freq: Four times a day (QID) | RECTAL | Status: DC | PRN

## 2022-07-29 MED ORDER — INSULIN ASPART 100 UNIT/ML IJ SOLN
0.0000 [IU] | Freq: Three times a day (TID) | INTRAMUSCULAR | Status: DC
  Administered 2022-07-30: 3 [IU] via SUBCUTANEOUS
  Administered 2022-07-30: 1 [IU] via SUBCUTANEOUS
  Administered 2022-07-30 – 2022-07-31 (×3): 2 [IU] via SUBCUTANEOUS
  Administered 2022-08-01: 3 [IU] via SUBCUTANEOUS
  Administered 2022-08-01: 2 [IU] via SUBCUTANEOUS
  Administered 2022-08-01 – 2022-08-02 (×2): 1 [IU] via SUBCUTANEOUS
  Administered 2022-08-02: 3 [IU] via SUBCUTANEOUS
  Administered 2022-08-02 – 2022-08-03 (×2): 2 [IU] via SUBCUTANEOUS
  Administered 2022-08-03: 1 [IU] via SUBCUTANEOUS
  Administered 2022-08-04: 2 [IU] via SUBCUTANEOUS

## 2022-07-29 MED ORDER — MELATONIN 3 MG PO TABS
21.0000 mg | ORAL_TABLET | Freq: Every evening | ORAL | Status: DC | PRN
  Administered 2022-07-31: 21 mg via ORAL
  Filled 2022-07-29: qty 7

## 2022-07-29 MED ORDER — METRONIDAZOLE 500 MG/100ML IV SOLN
500.0000 mg | Freq: Once | INTRAVENOUS | Status: AC
  Administered 2022-07-29: 500 mg via INTRAVENOUS
  Filled 2022-07-29: qty 100

## 2022-07-29 MED ORDER — VANCOMYCIN HCL IN DEXTROSE 1-5 GM/200ML-% IV SOLN: 1000.0000 mg | Freq: Once | INTRAVENOUS | Status: DC

## 2022-07-29 MED ORDER — PRAVASTATIN SODIUM 40 MG PO TABS
40.0000 mg | ORAL_TABLET | Freq: Every evening | ORAL | Status: DC
  Administered 2022-07-30 – 2022-08-03 (×5): 40 mg via ORAL
  Filled 2022-07-29 (×5): qty 1

## 2022-07-29 MED ORDER — PHYTONADIONE 5 MG PO TABS
2.5000 mg | ORAL_TABLET | Freq: Once | ORAL | Status: AC
  Administered 2022-07-29: 2.5 mg via ORAL
  Filled 2022-07-29: qty 1

## 2022-07-29 MED ORDER — GLUCERNA SHAKE PO LIQD
237.0000 mL | Freq: Three times a day (TID) | ORAL | Status: DC
  Administered 2022-07-30 – 2022-08-03 (×13): 237 mL via ORAL

## 2022-07-29 MED ORDER — PANTOPRAZOLE SODIUM 40 MG PO TBEC
40.0000 mg | DELAYED_RELEASE_TABLET | Freq: Every day | ORAL | Status: DC
  Administered 2022-07-30 – 2022-08-04 (×6): 40 mg via ORAL
  Filled 2022-07-29 (×6): qty 1

## 2022-07-29 MED ORDER — VANCOMYCIN HCL 750 MG/150ML IV SOLN: 750.0000 mg | INTRAVENOUS | Status: DC

## 2022-07-29 MED ORDER — SODIUM CHLORIDE 0.9 % IV SOLN: INTRAVENOUS | Status: DC

## 2022-07-29 MED ORDER — SODIUM CHLORIDE 0.9 % IV SOLN
2.0000 g | Freq: Once | INTRAVENOUS | Status: AC
  Administered 2022-07-29: 2 g via INTRAVENOUS
  Filled 2022-07-29: qty 12.5

## 2022-07-29 MED ORDER — ACETAMINOPHEN 325 MG PO TABS
650.0000 mg | ORAL_TABLET | Freq: Four times a day (QID) | ORAL | Status: DC | PRN
  Administered 2022-07-30 – 2022-07-31 (×2): 650 mg via ORAL
  Filled 2022-07-29 (×2): qty 2

## 2022-07-29 MED ORDER — PHYTONADIONE 1 MG/0.5 ML ORAL SOLUTION
2.5000 mg | Freq: Once | ORAL | Status: DC
Start: 2022-07-29 — End: 2022-07-29

## 2022-07-29 MED ORDER — VANCOMYCIN HCL 1250 MG/250ML IV SOLN
1250.0000 mg | Freq: Once | INTRAVENOUS | Status: AC
  Administered 2022-07-29: 1250 mg via INTRAVENOUS
  Filled 2022-07-29: qty 250

## 2022-07-29 NOTE — Sepsis Progress Note (Signed)
Abx were given before blood cultures were collected due to patient being a very hard stick.  There is an order to DO NOT delay abx if unable to obtain blood cultures.

## 2022-07-29 NOTE — ED Notes (Signed)
Lab called at this time to obtain blood cultures.

## 2022-07-29 NOTE — Progress Notes (Signed)
Pharmacy Antibiotic Note  Patrick Beck is a 76 y.o. male admitted on 07/29/2022 with cellulitis.  Pharmacy has been consulted for cefepime and vancomycin dosing.  Plan: Cefepime 1 gm IV q12 hr. Vancomycin 1250 mg IV load then 750 mg IV q24 hr. Vancomycin goal: AUC 400-550. Follow levels, renal function, and LOT.   Height: '6\' 1"'$  (185.4 cm) Weight: 59 kg (130 lb) IBW/kg (Calculated) : 79.9  Temp (24hrs), Avg:97.9 F (36.6 C), Min:97.6 F (36.4 C), Max:98.2 F (36.8 C)  Recent Labs  Lab 07/29/22 1806  WBC 6.4  CREATININE 1.27*  LATICACIDVEN 4.9*    Estimated Creatinine Clearance: 42.6 mL/min (A) (by C-G formula based on SCr of 1.27 mg/dL (H)).    Allergies  Allergen Reactions   Macadamia Nut Oil Anaphylaxis    Throat swelling and can't breathe    Antimicrobials this admission: Metronidazole 7/31 x1 Cefepime 7/31 >> Vancomycin 7/31 >>  Dose adjustments this admission: Cefepime 1 gm q8 to q12 hr.  Microbiology results: 7/31 BCx: pending 7/31 UCx: pending    Thank you for allowing pharmacy to be a part of this patient's care.  Blenda Nicely 07/29/2022 6:46 PM

## 2022-07-29 NOTE — Sepsis Progress Note (Signed)
Notified bedside nurse of need to draw repeat lactic acid. 

## 2022-07-29 NOTE — Sepsis Progress Note (Signed)
Notified provider of need to order repeat lactic acid. ° °

## 2022-07-29 NOTE — Sepsis Progress Note (Signed)
Sepsis protocol monitored by eLink 

## 2022-07-29 NOTE — ED Notes (Signed)
EDP [place] notified of possible code sepsis.

## 2022-07-29 NOTE — ED Notes (Signed)
Hospitalist at bedside 

## 2022-07-29 NOTE — ED Triage Notes (Signed)
Patient states he is leaking fluid from his left foot and has fluid retention to his left arm. Patient has yellow eschar on left foot with redness and swelling to left foot and left arm. Patient states pain in left arm.

## 2022-07-29 NOTE — H&P (Signed)
History and Physical    Patient: Patrick Beck EXB:[id number] DOB: 15-Jul-1946 DOA: 07/29/2022 DOS: the patient was seen and examined on 07/29/2022 PCP: Lemmie Evens, MD  Patient coming from: Home  Chief Complaint:  Chief Complaint  Patient presents with   Leg Swelling   Arm Swelling   HPI: Patrick Beck is a 76 y.o. male with medical history significant of CAD, hypertension, T2DM, GERD, severe aortic stenosis s/p TAVR who presents to the emergency department who presents to the emergency department due to 2-day onset of redness on the dorsum of left hand which has since spread to the left arm and this is associated with left arm swelling, he also complained of left foot swelling with fluid leaking out of the foot since last 3 days.  He decided to come to the emergency department for further evaluation and management.  He complained of left arm pain, but was not sure if he had any fever but denies chest pain, shortness of breath, headache, nausea, vomiting, abdominal pain.  ED Course:  In the emergency department, he was tachycardic and tachypneic.  BP was 110/88, temperature 97.84F and O2 sat was 100% on room air.  Work-up in the ED showed normal CBC, hypokalemia, BUN/creatinine 30/1.27 (baseline creatinine at 0.7-1.1), CBG 148, INR 7.4, lactic acid 4.9, albumin 2.4, influenza A, B, SARS coronavirus 2 was negative Chest x-ray showed no acute abnormality Patient was empirically treated with IV cefepime, Flagyl and vancomycin due to sepsis secondary to cellulitis.  IV hydration was provided.  Hospitalist was asked to admit patient for further evaluation and management.  Review of Systems: Review of systems as noted in the HPI. All other systems reviewed and are negative.   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 Angelena Form & Dr. Cyndia Bent   Severe aortic stenosis    Type 2 diabetes mellitus [facility])    Past Surgical History:  Procedure Laterality Date   BIOPSY  07/03/2022   Procedure: BIOPSY;  Surgeon: Eloise Harman, DO;  [facility];  Service: Endoscopy;;   CARDIAC CATHETERIZATION     COLONOSCOPY N/A 04/06/2013   Procedure: COLONOSCOPY;  Surgeon: Jamesetta So, MD;  [facility];  Service: Gastroenterology;  Laterality: N/A;   ESOPHAGOGASTRODUODENOSCOPY (EGD) WITH PROPOFOL N/A 07/03/2022   Procedure: ESOPHAGOGASTRODUODENOSCOPY (EGD) WITH PROPOFOL;  Surgeon: Eloise Harman, DO;  [facility];  Service: Endoscopy;  Laterality: N/A;  1:00pm, moved to 7/5 @ 9:45   EYE SURGERY Left    laser sx fr glaucoma   HERNIA REPAIR     Three prior surgeries before 1966   MULTIPLE EXTRACTIONS WITH ALVEOLOPLASTY N/A 05/24/2021   Procedure: EXTRACTION OF TEETH NUMBER TWO, THREE, FIVE, SIX, SEVEN, EIGHT, NINE, TEN, ELEVEN, TWELVE, SEVENTEEN, EIGHTTEEN, NINETEEN, TWENTY, TWENTY-ONE, TWENTY- TWO, TWENTY-FOUR, THWENTY-SEVEN, TWENTY-EIGHT, TWENTY-NINE WITH ALVEOLOPLASTY IN  ALL FOUR QUADRANTS;  Surgeon: [name], DMD;  Location: [place];  Service: Dentistry;  Laterality: N/A;   RIGHT/LEFT HEART CATH AND CORONARY ANGIOGRAPHY N/A 04/18/2021   Procedure: RIGHT/LEFT HEART CATH AND CORONARY ANGIOGRAPHY;  Surgeon: Burnell Blanks, MD;  Location: [facility] [facility];  Service: Cardiovascular;  Laterality: N/A;   TEE WITHOUT CARDIOVERSION N/A 06/26/2021   Procedure: TRANSESOPHAGEAL ECHOCARDIOGRAM (TEE);  Surgeon: Burnell Blanks, MD;  Location: [place];  Service: Open Heart Surgery;  Laterality: N/A;   TONSILLECTOMY     at age 29    Social History:  reports that he has been smoking cigarettes. He has a 112.00 pack-year smoking history. He has never used smokeless tobacco. He reports that he does not drink alcohol and does not use drugs.   Allergies  Allergen Reactions   Macadamia Nut Oil Anaphylaxis    Throat swelling and can't breathe    Family History  Problem Relation Age of Onset   Aortic stenosis Father    COPD Mother    Cancer Neg Hx      Prior to Admission medications   Medication Sig Start Date End Date Taking? Authorizing Provider  aspirin 81 MG chewable tablet Chew 1 tablet (81 mg total) by mouth daily. Can swallow whole 06/28/21   Eileen Stanford, PA-C  brimonidine [facility]) 0.2 % ophthalmic solution Place 1 drop into both eyes 3 (three) times daily. 05/23/20   [provider]  diphenhydrAMINE (BENADRYL) 25 MG tablet Take 25 mg by mouth daily as needed for allergies.    [provider]  furosemide (LASIX) 40 MG tablet Take 40 mg by mouth in the morning.    [provider]  glimepiride (AMARYL) 1 MG tablet Take 0.5 mg by mouth in the morning. 05/13/22   [provider]  hydrochlorothiazide (HYDRODIURIL) 25 MG tablet Take 25 mg by mouth in the morning. 03/04/22   [provider]  HYDROcodone-acetaminophen (NORCO) 7.5-325 MG tablet Take 1 tablet by mouth 4 (four) times daily as needed for pain. 06/04/22   [provider]  hydrocortisone cream 1 % Apply 1 application topically 2 (two) times daily as needed (bug bites).    [provider]  ibuprofen (ADVIL) 200 MG tablet Take 800 mg by mouth every 8 (eight) hours as needed (pain.).    [provider]  latanoprost (XALATAN) 0.005 % ophthalmic solution Place 1 drop into both eyes at bedtime. 10/14/17   [provider]  magnesium oxide (MAG-OX) 400 (240 Mg) MG tablet  Take 400 mg by mouth in the morning.    [provider]  Melatonin 10 MG CAPS Take 20-30 mg by mouth at bedtime as needed (sleep).    [provider]  metFORMIN (GLUCOPHAGE) 1000 MG tablet Take 1,000 mg by mouth 2 (two) times daily with a meal.    [provider]  Multiple Vitamin (MULTIVITAMIN WITH MINERALS) TABS tablet Take 1 tablet by mouth daily with supper.    [provider]  neomycin-bacitracin-polymyxin (NEOSPORIN) ointment Apply 1 application topically as needed for wound care.    [provider]  pantoprazole (PROTONIX) 40 MG tablet TAKE 1 TABLET BY MOUTH EVERY DAY 03/29/22   Eileen Stanford, PA-C  pravastatin (PRAVACHOL) 40 MG tablet Take 40 mg by mouth every evening.    [provider]  spironolactone (ALDACTONE) 25 MG tablet Take 25 mg by mouth in the morning.    [provider]  timolol (TIMOPTIC) 0.5 %  ophthalmic solution Place 1 drop into both eyes every evening. 08/16/21   [provider]    Physical Exam: BP 103/70 (BP Location: Right Arm)   Pulse (!) 105   Temp 98.3 F (36.8 C)   Resp 19   Ht 6' 1" (1.854 m)   Wt 59 kg   SpO2 100%   BMI 17.15 kg/m   General: 76 y.o. year-old male well developed well nourished in no acute distress.  Alert and oriented x3. HEENT: NCAT, EOMI, dry mucous membrane. Neck: Supple, trachea medial Cardiovascular: Tachycardia.  Regular rate and rhythm with no rubs or gallops.  No thyromegaly or JVD noted.  2/4 pulses in all 4 extremities. Respiratory: Tachypnea.  Clear to auscultation with no wheezes or rales. Good inspiratory effort. Abdomen: Soft, nontender nondistended with normal bowel sounds x4 quadrants. Muskuloskeletal: Swelling of left upper extremity with erythema from left hand to left arm which is warm to touch compared to right upper extremity.  Swelling of right arm with redness due to blood draws. Neuro: CN II-XII intact, strength 5/5 x 4, sensation,  reflexes intact Skin: No ulcerative lesions noted or rashes Psychiatry: Mood is appropriate for condition and setting          Labs on Admission:  Basic Metabolic Panel: Recent Labs  Lab 07/29/22 1806  NA 136  K 5.3*  CL 103  CO2 22  GLUCOSE 148*  BUN 30*  CREATININE 1.27*  CALCIUM 7.7*   Liver Function Tests: Recent Labs  Lab 07/29/22 1806  AST 22  ALT 17  ALKPHOS 91  BILITOT 0.9  PROT 5.1*  ALBUMIN 2.4*   No results for input(s): "LIPASE", "AMYLASE" in the last 168 hours. No results for input(s): "AMMONIA" in the last 168 hours. CBC: Recent Labs  Lab 07/29/22 1806  WBC 6.4  NEUTROABS 4.9  HGB 13.4  HCT 41.7  MCV 98.8  PLT 162   Cardiac Enzymes: No results for input(s): "CKTOTAL", "CKMB", "CKMBINDEX", "TROPONINI" in the last 168 hours.  BNP (last 3 results) No results for input(s): "BNP" in the last 8760 hours.  ProBNP (last 3 results) No results for input(s): "PROBNP" in the last 8760 hours.  CBG: No results for input(s): "GLUCAP" in the last 168 hours.  Radiological Exams on Admission: DG Chest Port 1 View  Result Date: 07/29/2022 CLINICAL DATA:  Left lower extremity peripheral swelling, possible sepsis, initial encounter EXAM: PORTABLE CHEST 1 VIEW COMPARISON:  03/21/2022 FINDINGS: Cardiac shadow is stable. 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 Aortic calcifications are seen. The lungs are well aerated bilaterally. Skin folds are noted over the right chest. No focal infiltrate or effusion is seen. No bony abnormality is noted. IMPRESSION: No acute abnormality noted. Electronically Signed   By: Inez Catalina M.D.   On: 07/29/2022 19:34    EKG: I independently viewed the EKG done and my findings are as followed: Sinus or ectopic atrial tachycardia at a rate of 114 bpm  Assessment/Plan Present on Admission:  Left arm cellulitis  Essential hypertension  Principal Problem:   Severe sepsis ([place]) Active Problems:   Essential hypertension   Left arm  cellulitis   Insomnia   Chronic diastolic CHF (congestive heart failure) (HCC)   Mixed hyperlipidemia   AKI (acute kidney injury) ([place])   Dehydration   Hyperkalemia   Hypoalbuminemia due to protein-calorie malnutrition (HCC)   Supratherapeutic INR   Lactic acidosis   Type 2 diabetes mellitus with hyperglycemia (HCC)   GERD (gastroesophageal reflux disease)  Severe sepsis possibly secondary to left arm cellulitis Left foot wound Patient was tachycardic and tachypneic (met SIRS criteria), source of infection was suspected to be cellulitis of left hand (met sepsis criteria).  Lactic acid was 4.9 > 5.8 (met severe sepsis criteria) Patient was started on IV cefepime, Flagyl and vancomycin, we shall continue vancomycin and cefepime IV hydration per sepsis protocol provided.  Continue IV hydration Continue wound care Continue Tylenol as needed Blood culture pending Continue to trend lactic acid  Supratherapeutic INR INR 7.4 Patient was not on warfarin Vitamin K 2.5 mg p.o. x1 was given Continue to monitor INR DVT prophylaxis will be temporarily held at this time  Hyperkalemia K+ 5.3; IV hydration was provided Recheck potassium level and treat accordingly  Hypoalbuminemia possibly secondary to moderate protein calorie malnutrition Albumin 2.4, protein supplement to be provided  Type 2 diabetes mellitus with hyperglycemia Continue ISS and hypoglycemia protocol Glimepiride and metformin will be held at this time  Dehydration Acute kidney injury BUN/creatinine 30/1.27 (baseline creatinine at 0.7-1.1) IV hydration was provided Renally adjust medications, avoid nephrotoxic agents/dehydration/hypotension  Insomnia Continue melatonin  GERD Continue Protonix  Mixed hyperlipidemia Continue pravastatin  Chronic diastolic CHF Echocardiogram done on 05/08/2022 showed LVEF of 60 to 65% no RWMA.  Mild concentric LVH.  G1 DD Continue total input/output, daily weights and fluid  restriction Continue Cardiac diet  Home Lasix and spironolactone on hold due to soft BP and dehydration  Essential hypertension BP meds will be held at this time due to soft BP  DVT prophylaxis: SCDs  Code Status: Full code  Consults: None  Family Communication: Wife at bedside (all questions answered to satisfaction)  Severity of Illness: The appropriate patient status for this patient is INPATIENT. Inpatient status is judged to be reasonable and necessary in order to provide the required intensity of service to ensure the patient's safety. The patient's presenting symptoms, physical exam findings, and initial radiographic and laboratory data in the context of their chronic comorbidities is felt to place them at high risk for further clinical deterioration. Furthermore, it is not anticipated that the patient will be medically stable for discharge from the hospital within 2 midnights of admission.   * I certify that at the point of admission it is my clinical judgment that the patient will require inpatient hospital care spanning beyond 2 midnights from the point of admission due to high intensity of service, high risk for further deterioration and high frequency of surveillance required.*  Author: Bernadette Hoit, DO 07/29/2022 10:21 PM  For on call review [email or web address].

## 2022-07-30 ENCOUNTER — Inpatient Hospital Stay (HOSPITAL_COMMUNITY): Payer: Medicare Other

## 2022-07-30 DIAGNOSIS — A419 Sepsis, unspecified organism: Secondary | ICD-10-CM | POA: Diagnosis not present

## 2022-07-30 DIAGNOSIS — R6520 Severe sepsis without septic shock: Secondary | ICD-10-CM | POA: Diagnosis not present

## 2022-07-30 LAB — BASIC METABOLIC PANEL
Anion gap: 9 (ref 5–15)
BUN: 31 mg/dL — ABNORMAL HIGH (ref 8–23)
CO2: 22 mmol/L (ref 22–32)
Calcium: 7.4 mg/dL — ABNORMAL LOW (ref 8.9–10.3)
Chloride: 105 mmol/L (ref 98–111)
Creatinine, Ser: 1.32 mg/dL — ABNORMAL HIGH (ref 0.61–1.24)
GFR, Estimated: 57 mL/min — ABNORMAL LOW (ref >60)
Glucose, Bld: 187 mg/dL — ABNORMAL HIGH (ref 70–99)
Potassium: 4.5 mmol/L (ref 3.5–5.1)
Sodium: 136 mmol/L (ref 135–145)

## 2022-07-30 LAB — GLUCOSE, CAPILLARY
Glucose-Capillary: 150 mg/dL — ABNORMAL HIGH (ref 70–99)
Glucose-Capillary: 226 mg/dL — ABNORMAL HIGH (ref 70–99)
Glucose-Capillary: 160 mg/dL — ABNORMAL HIGH (ref 70–99)
Glucose-Capillary: 114 mg/dL — ABNORMAL HIGH (ref 70–99)

## 2022-07-30 LAB — PHOSPHORUS: Phosphorus: 2.4 mg/dL — ABNORMAL LOW (ref 2.5–4.6)

## 2022-07-30 LAB — PROTIME-INR
INR: 1.3 — ABNORMAL HIGH (ref 0.8–1.2)
Prothrombin Time: 16 seconds — ABNORMAL HIGH (ref 11.4–15.2)

## 2022-07-30 LAB — LACTIC ACID, PLASMA
Lactic Acid, Venous: 4.2 mmol/L (ref 0.5–1.9)
Lactic Acid, Venous: 4.9 mmol/L (ref 0.5–1.9)

## 2022-07-30 LAB — CBC
HCT: 35.8 % — ABNORMAL LOW (ref 39.0–52.0)
Hemoglobin: 11.6 g/dL — ABNORMAL LOW (ref 13.0–17.0)
MCH: 32 pg (ref 26.0–34.0)
MCHC: 32.4 g/dL (ref 30.0–36.0)
MCV: 98.6 fL (ref 80.0–100.0)
Platelets: 140 10*3/uL — ABNORMAL LOW (ref 150–400)
RBC: 3.63 MIL/uL — ABNORMAL LOW (ref 4.22–5.81)
RDW: 15.4 % (ref 11.5–15.5)
WBC: 8.5 10*3/uL (ref 4.0–10.5)
nRBC: 0 % (ref 0.0–0.2)

## 2022-07-30 LAB — HEMOGLOBIN A1C
Hgb A1c MFr Bld: 6.6 % — ABNORMAL HIGH (ref 4.8–5.6)
Mean Plasma Glucose: 142.72 mg/dL

## 2022-07-30 LAB — APTT: aPTT: 36 seconds (ref 24–36)

## 2022-07-30 LAB — MAGNESIUM: Magnesium: 1.1 mg/dL — ABNORMAL LOW (ref 1.7–2.4)

## 2022-07-30 MED ORDER — ADULT MULTIVITAMIN W/MINERALS CH
1.0000 | ORAL_TABLET | Freq: Every day | ORAL | Status: DC
  Administered 2022-07-30 – 2022-08-03 (×5): 1 via ORAL
  Filled 2022-07-30 (×5): qty 1

## 2022-07-30 MED ORDER — LATANOPROST 0.005 % OP SOLN
1.0000 [drp] | Freq: Every day | OPHTHALMIC | Status: DC
Start: 2022-07-30 — End: 2022-08-04
  Administered 2022-07-30 – 2022-08-03 (×5): 1 [drp] via OPHTHALMIC
  Filled 2022-07-30: qty 2.5

## 2022-07-30 MED ORDER — BRIMONIDINE TARTRATE 0.2 % OP SOLN
1.0000 [drp] | Freq: Three times a day (TID) | OPHTHALMIC | Status: DC
  Administered 2022-07-30 – 2022-08-04 (×15): 1 [drp] via OPHTHALMIC
  Filled 2022-07-30 (×2): qty 5

## 2022-07-30 MED ORDER — ASPIRIN 81 MG PO CHEW
81.0000 mg | CHEWABLE_TABLET | Freq: Every day | ORAL | Status: DC
  Administered 2022-07-30 – 2022-08-04 (×6): 81 mg via ORAL
  Filled 2022-07-30 (×6): qty 1

## 2022-07-30 MED ORDER — LOPERAMIDE HCL 2 MG PO CAPS
2.0000 mg | ORAL_CAPSULE | ORAL | Status: DC | PRN
  Administered 2022-07-30 (×2): 2 mg via ORAL
  Filled 2022-07-30 (×2): qty 1

## 2022-07-30 MED ORDER — TIMOLOL MALEATE 0.5 % OP SOLN
1.0000 [drp] | Freq: Every evening | OPHTHALMIC | Status: DC
Start: 2022-07-30 — End: 2022-08-04
  Administered 2022-07-30 – 2022-08-03 (×5): 1 [drp] via OPHTHALMIC
  Filled 2022-07-30: qty 5

## 2022-07-30 MED ORDER — MAGNESIUM SULFATE 2 GM/50ML IV SOLN
2.0000 g | Freq: Once | INTRAVENOUS | Status: AC
  Administered 2022-07-30: 2 g via INTRAVENOUS
  Filled 2022-07-30: qty 50

## 2022-07-30 MED ORDER — SODIUM CHLORIDE 0.9 % IV SOLN
2.0000 g | Freq: Two times a day (BID) | INTRAVENOUS | Status: AC
  Administered 2022-07-30 – 2022-08-02 (×7): 2 g via INTRAVENOUS
  Filled 2022-07-30 (×7): qty 12.5

## 2022-07-30 MED ORDER — VANCOMYCIN HCL IN DEXTROSE 1-5 GM/200ML-% IV SOLN
1000.0000 mg | INTRAVENOUS | Status: DC
  Administered 2022-07-30 – 2022-08-01 (×3): 1000 mg via INTRAVENOUS
  Filled 2022-07-30 (×3): qty 200

## 2022-07-30 NOTE — Progress Notes (Addendum)
Pharmacy Antibiotic Note  Patrick Beck is a 76 y.o. male admitted on 07/29/2022 with cellulitis.  Pharmacy has been consulted for cefepime and vancomycin dosing.  Plan: Increase Cefepime 2 gm IV q12 hr. Change Vancomycin  1000 mg IV q24 hr.  Vancomycin goal: AUC 400-550 Expected AUC 462 Scr 1.32. Follow levels, renal function, and LOT.  Height: '6\' 1"'$  (185.4 cm) Weight: 59.8 kg (131 lb 12.8 oz) IBW/kg (Calculated) : 79.9  Temp (24hrs), Avg:98 F (36.7 C), Min:97.6 F (36.4 C), Max:98.3 F (36.8 C)  Recent Labs  Lab 07/29/22 1806 07/29/22 2039 07/29/22 2344 07/30/22 0410  WBC 6.4  --   --  8.5  CREATININE 1.27*  --   --  1.32*  LATICACIDVEN 4.9* 5.8* 4.9* 4.2*     Estimated Creatinine Clearance: 41.5 mL/min (A) (by C-G formula based on SCr of 1.32 mg/dL (H)).    Allergies  Allergen Reactions   Macadamia Nut Oil Anaphylaxis    Throat swelling and can't breathe    Antimicrobials this admission: Metronidazole 7/31 x1 Cefepime 7/31 >> Vancomycin 7/31 >>  Microbiology results: 7/31 BCx: pending 7/31 UCx: pending   Thank you for allowing pharmacy to be a part of this patient's care.  Isac Sarna, BS Pharm D, BCPS Clinical Pharmacist 07/30/2022 9:08 AM

## 2022-07-30 NOTE — TOC Progression Note (Signed)
Transition of Care [facility]) - Progression Note    Patient Details  Name: RIDDICK NUON MRN: [id number] Date of Birth: 01/15/1946  Transition of Care [facility]) CM/SW Contact  Salome Arnt, [place] Phone Number: 07/30/2022, 10:32 AM  Clinical Narrative:   Transition of Care [facility]) Screening Note   Patient Details  Name: ELMAR ANTIGUA Date of Birth: 05/08/1946   Transition of Care [facility]) CM/SW Contact:    Salome Arnt, [place] Phone Number: 07/30/2022, 10:32 AM    Transition of Care Department [facility]) has reviewed patient and no TOC needs have been identified at this time. We will continue to monitor patient advancement through interdisciplinary progression rounds. If new patient transition needs arise, please place a TOC consult.         Barriers to Discharge: Continued Medical Work up  Expected Discharge Plan and Services                                                 Social Determinants of Health (SDOH) Interventions    Readmission Risk Interventions     No data to display

## 2022-07-30 NOTE — ED Provider Notes (Signed)
[facility] Provider Note   CSN: [id number] Arrival date & time: 07/29/22  1657     History  Chief Complaint  Patient presents with   Leg Swelling   Arm Swelling    Patrick Beck is a 76 y.o. male.  Patient complains of swelling to his left arm tenderness and redness.  Patient has a history of hypertension  The history is provided by the patient and medical records. No language interpreter was used.  Weakness Severity:  Moderate Onset quality:  Sudden Timing:  Constant Progression:  Worsening Chronicity:  New Context: not alcohol use   Relieved by:  Nothing Worsened by:  Nothing Ineffective treatments:  None tried Associated symptoms: no abdominal pain, no chest pain, no cough, no diarrhea, no frequency, no headaches and no seizures        Home Medications Prior to Admission medications   Medication Sig Start Date End Date Taking? Authorizing Provider  aspirin 81 MG chewable tablet Chew 1 tablet (81 mg total) by mouth daily. Can swallow whole 06/28/21   Eileen Stanford, PA-C  brimonidine [facility]) 0.2 % ophthalmic solution Place 1 drop into both eyes 3 (three) times daily. 05/23/20   [provider]  diphenhydrAMINE (BENADRYL) 25 MG tablet Take 25 mg by mouth daily as needed for allergies.    [provider]  furosemide (LASIX) 40 MG tablet Take 40 mg by mouth in the morning.    [provider]  glimepiride (AMARYL) 1 MG tablet Take 0.5 mg by mouth in the morning. 05/13/22   [provider]  hydrochlorothiazide (HYDRODIURIL) 25 MG tablet Take 25 mg by mouth in the morning. 03/04/22   [provider]  HYDROcodone-acetaminophen (NORCO) 7.5-325 MG tablet Take 1 tablet by mouth 4 (four) times daily as needed for pain. 06/04/22   [provider]  hydrocortisone cream 1 % Apply 1 application topically 2 (two) times daily as needed (bug bites).    [provider]  ibuprofen (ADVIL) 200 MG tablet  Take 800 mg by mouth every 8 (eight) hours as needed (pain.).    [provider]  latanoprost (XALATAN) 0.005 % ophthalmic solution Place 1 drop into both eyes at bedtime. 10/14/17   [provider]  magnesium oxide (MAG-OX) 400 (240 Mg) MG tablet Take 400 mg by mouth in the morning.    [provider]  Melatonin 10 MG CAPS Take 20-30 mg by mouth at bedtime as needed (sleep).    [provider]  metFORMIN (GLUCOPHAGE) 1000 MG tablet Take 1,000 mg by mouth 2 (two) times daily with a meal.    [provider]  Multiple Vitamin (MULTIVITAMIN WITH MINERALS) TABS tablet Take 1 tablet by mouth daily with supper.    [provider]  neomycin-bacitracin-polymyxin (NEOSPORIN) ointment Apply 1 application topically as needed for wound care.    [provider]  pantoprazole (PROTONIX) 40 MG tablet TAKE 1 TABLET BY MOUTH EVERY DAY 03/29/22   Eileen Stanford, PA-C  pravastatin (PRAVACHOL) 40 MG tablet Take 40 mg by mouth every evening.    [provider]  spironolactone (ALDACTONE) 25 MG tablet Take 25 mg by mouth in the morning.    [provider]  timolol (TIMOPTIC) 0.5 % ophthalmic solution Place 1 drop into both eyes every evening. 08/16/21   [provider]      Allergies    Macadamia nut oil    Review of Systems   Review of Systems  Constitutional:  Negative for appetite change and fatigue.  HENT:  Negative for congestion, ear discharge and sinus pressure.   Eyes:  Negative for discharge.  Respiratory:  Negative for cough.   Cardiovascular:  Negative for chest pain.  Gastrointestinal:  Negative for abdominal pain and diarrhea.  Genitourinary:  Negative for frequency and hematuria.  Musculoskeletal:  Negative for back pain.       Right arm swollen tender  Skin:  Negative for rash.  Neurological:  Positive for weakness. Negative for seizures and headaches.  Psychiatric/Behavioral:  Negative for  hallucinations.     Physical Exam Updated Vital Signs BP 101/63 (BP Location: Right Arm)   Pulse 93   Temp 97.8 F (36.6 C)   Resp 19   Ht '6\' 1"'$  (1.854 m)   Wt 59.8 kg   SpO2 97%   BMI 17.39 kg/m  Physical Exam Vitals and nursing note reviewed.  Constitutional:      Appearance: He is well-developed.  HENT:     Head: Normocephalic.     Nose: Nose normal.  Eyes:     General: No scleral icterus.    Conjunctiva/sclera: Conjunctivae normal.  Neck:     Thyroid: No thyromegaly.  Cardiovascular:     Rate and Rhythm: Normal rate and regular rhythm.     Heart sounds: No murmur heard.    No friction rub. No gallop.  Pulmonary:     Breath sounds: No stridor. No wheezing or rales.  Chest:     Chest wall: No tenderness.  Abdominal:     General: There is no distension.     Tenderness: There is no abdominal tenderness. There is no rebound.  Musculoskeletal:        General: Normal range of motion.     Cervical back: Neck supple.     Comments: Cellulitis to left arm and possible cellulitis to left foot  Lymphadenopathy:     Cervical: No cervical adenopathy.  Skin:    Findings: No erythema or rash.  Neurological:     Mental Status: He is alert and oriented to person, place, and time.     Motor: No abnormal muscle tone.     Coordination: Coordination normal.  Psychiatric:        Behavior: Behavior normal.     ED Results / Procedures / Treatments   Labs (all labs ordered are listed, but only abnormal results are displayed) Labs Reviewed  COMPREHENSIVE METABOLIC PANEL - Abnormal; Notable for the following components:      Result Value   Potassium 5.3 (*)    Glucose, Bld 148 (*)    BUN 30 (*)    Creatinine, Ser 1.27 (*)    Calcium 7.7 (*)    Total Protein 5.1 (*)    Albumin 2.4 (*)    GFR, Estimated 59 (*)    All other components within normal limits  LACTIC ACID, PLASMA - Abnormal; Notable for the following components:   Lactic Acid, Venous 4.9 (*)    All other  components within normal limits  LACTIC ACID, PLASMA - Abnormal; Notable for the following components:   Lactic Acid, Venous 5.8 (*)    All other components within normal limits  PROTIME-INR - Abnormal; Notable for the following components:   Prothrombin Time 62.3 (*)    INR 7.4 (*)    All other components within normal limits  APTT - Abnormal; Notable for the following components:   aPTT 72 (*)    All other components  within normal limits  BASIC METABOLIC PANEL - Abnormal; Notable for the following components:   Glucose, Bld 187 (*)    BUN 31 (*)    Creatinine, Ser 1.32 (*)    Calcium 7.4 (*)    GFR, Estimated 57 (*)    All other components within normal limits  CBC - Abnormal; Notable for the following components:   RBC 3.63 (*)    Hemoglobin 11.6 (*)    HCT 35.8 (*)    Platelets 140 (*)    All other components within normal limits  MAGNESIUM - Abnormal; Notable for the following components:   Magnesium 1.1 (*)    All other components within normal limits  PHOSPHORUS - Abnormal; Notable for the following components:   Phosphorus 2.4 (*)    All other components within normal limits  PROTIME-INR - Abnormal; Notable for the following components:   Prothrombin Time 16.0 (*)    INR 1.3 (*)    All other components within normal limits  LACTIC ACID, PLASMA - Abnormal; Notable for the following components:   Lactic Acid, Venous 4.9 (*)    All other components within normal limits  LACTIC ACID, PLASMA - Abnormal; Notable for the following components:   Lactic Acid, Venous 4.2 (*)    All other components within normal limits  HEMOGLOBIN A1C - Abnormal; Notable for the following components:   Hgb A1c MFr Bld 6.6 (*)    All other components within normal limits  GLUCOSE, CAPILLARY - Abnormal; Notable for the following components:   Glucose-Capillary 200 (*)    All other components within normal limits  GLUCOSE, CAPILLARY - Abnormal; Notable for the following components:    Glucose-Capillary 150 (*)    All other components within normal limits  CULTURE, BLOOD (ROUTINE X 2)  CULTURE, BLOOD (ROUTINE X 2)  RESP PANEL BY RT-PCR (FLU A&B, COVID) ARPGX2  URINE CULTURE  CBC WITH DIFFERENTIAL/PLATELET  URINALYSIS, ROUTINE W REFLEX MICROSCOPIC  APTT    EKG None  Radiology DG Chest Port 1 View  Result Date: 07/29/2022 CLINICAL DATA:  Left lower extremity peripheral swelling, possible sepsis, initial encounter EXAM: PORTABLE CHEST 1 VIEW COMPARISON:  03/21/2022 FINDINGS: Cardiac shadow is stable. 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 Aortic calcifications are seen. The lungs are well aerated bilaterally. Skin folds are noted over the right chest. No focal infiltrate or effusion is seen. No bony abnormality is noted. IMPRESSION: No acute abnormality noted. Electronically Signed   By: Inez Catalina M.D.   On: 07/29/2022 19:34    Procedures Procedures    Medications Ordered in ED Medications  acetaminophen (TYLENOL) tablet 650 mg (has no administration in time range)    Or  acetaminophen (TYLENOL) suppository 650 mg (has no administration in time range)  feeding supplement (GLUCERNA SHAKE) (GLUCERNA SHAKE) liquid 237 mL (237 mLs Oral Given 07/30/22 0900)  insulin aspart (novoLOG) injection 0-9 Units (1 Units Subcutaneous Given 07/30/22 0824)  pravastatin (PRAVACHOL) tablet 40 mg (has no administration in time range)  pantoprazole (PROTONIX) EC tablet 40 mg (40 mg Oral Given 07/30/22 0900)  melatonin tablet 21-30 mg (has no administration in time range)  ceFEPIme (MAXIPIME) 2 g in sodium chloride 0.9 % 100 mL IVPB (has no administration in time range)  vancomycin (VANCOCIN) IVPB 1000 mg/200 mL premix (has no administration in time range)  ceFEPIme (MAXIPIME) 2 g in sodium chloride 0.9 % 100 mL IVPB (0 g Intravenous Stopped 07/29/22 1902)  metroNIDAZOLE (FLAGYL) IVPB 500 mg (0 mg  Intravenous Stopped 07/29/22 2024)  sodium chloride 0.9 % bolus 1,000 mL (1,000 mLs Intravenous New  Bag/Given 07/29/22 1951)  lactated ringers bolus 1,000 mL (0 mLs Intravenous Stopped 07/29/22 1912)  vancomycin (VANCOREADY) IVPB 1250 mg/250 mL (1,250 mg Intravenous New Bag/Given 07/29/22 2046)  phytonadione (VITAMIN K) tablet 2.5 mg (2.5 mg Oral Given 07/29/22 2217)  magnesium sulfate IVPB 2 g 50 mL (2 g Intravenous New Bag/Given 07/30/22 4854)    ED Course/ Medical Decision Making/ A&P  CRITICAL CARE Performed by: Milton Ferguson Total critical care time: 40 minutes Critical care time was exclusive of separately billable procedures and treating other patients. Critical care was necessary to treat or prevent imminent or life-threatening deterioration. Critical care was time spent personally by me on the following activities: development of treatment plan with patient and/or surrogate as well as nursing, discussions with consultants, evaluation of patient's response to treatment, examination of patient, obtaining history from patient or surrogate, ordering and performing treatments and interventions, ordering and review of laboratory studies, ordering and review of radiographic studies, pulse oximetry and re-evaluation of patient's condition.                          Medical Decision Making Amount and/or Complexity of Data Reviewed Labs: ordered. Radiology: ordered. ECG/medicine tests: ordered.  Risk Prescription drug management. Decision regarding hospitalization.  This patient presents to the ED for concern of cellulitis, this involves an extensive number of treatment options, and is a complaint that carries with it a high risk of complications and morbidity.  The differential diagnosis includes sepsis and   Co morbidities that complicate the patient evaluation  Hypertension   Additional history obtained:  Additional history obtained from friend External records from outside source obtained and reviewed including hospital record   Lab Tests:  I Ordered, and personally  interpreted labs.  The pertinent results include: Lactic acid 4.9, white count 6.4   Imaging Studies ordered:  I ordered imaging studies including chest X I independently visualized and interpreted imaging which showed negative I agree with the radiologist interpretation   Cardiac Monitoring: / EKG:  The patient was maintained on a cardiac monitor.  I personally viewed and interpreted the cardiac monitored which showed an underlying rhythm of: Sinus tach   Consultations Obtained:  I requested consultation with the hospitalist,  and discussed lab and imaging findings as well as pertinent plan - they recommend: Admission   Problem List / ED Course / Critical interventions / Medication management  Hypertension cellulitis and sepsis I ordered medication including antibiotics for Reevaluation of the patient after these medicines showed that the patient stayed the same I have reviewed the patients home medicines and have made adjustments as needed   Social Determinants of Health:  None   Test / Admission - Considered:  No other test  Patient with sepsis secondary to cellulitis left arm.  He will be admitted to medicine        Final Clinical Impression(s) / ED Diagnoses Final diagnoses:  None    Rx / DC Orders ED Discharge Orders     None         Milton Ferguson, MD 07/30/22 1109

## 2022-07-30 NOTE — Consult Note (Signed)
[facility] Nurse wound consult note Consultation was completed by review of records, images and assistance from the bedside nurse/clinical staff.  Reason for Consult: LE wounds Wound type: no open wounds; bilateral LEs with some mild edema and scab, weeping serous fluid Pressure Injury POA: Yes; per staff Stage 2 on the buttocks  Measurement:see measurements of the buttock wounds on the nursing flow sheet  Wound bed:no open wounds on the LEs Drainage (amount, consistency, odor) serous Periwound: intact  Dressing procedure/placement/frequency:  Continue silicone foam per nursing skin care order set for the buttocks Cover weeping areas of the LLE with xeroform, wrap with kerlix and ACE wraps from toes to knees. Change daily  Discussed POC with patient and bedside nurse.  Re consult if needed, will not follow at this time. Thanks  Boy Delamater [employer], RN,CWOCN, CNS, [place] [phone])

## 2022-07-30 NOTE — Progress Notes (Addendum)
Patient arrived to floor, alert and oriented x 4. Patient has edematous left arm (3 plus)  and left lower extremity .   Right arm is weeping. Patient has 2 areas of stage 2  pressure ulcers to sacrum.

## 2022-07-30 NOTE — Progress Notes (Signed)
PROGRESS NOTE    Patrick Beck  HYI:[id number] DOB: 10-03-1946 DOA: 07/29/2022 PCP: Lemmie Evens, MD   Brief Narrative:    Patrick Beck is a 76 y.o. male with medical history significant of CAD, hypertension, T2DM, GERD, severe aortic stenosis s/p TAVR who presents to the emergency department who presents to the emergency department due to 2-day onset of redness on the dorsum of left hand which has since spread to the left arm and this is associated with left arm swelling, he also complained of left foot swelling with fluid leaking out of the foot since last 3 days.  Patient was admitted for severe sepsis, present on admission secondary to cellulitis to his left arm.  Assessment & Plan:   Principal Problem:   Severe sepsis ([place]) Active Problems:   Essential hypertension   Left arm cellulitis   Insomnia   Chronic diastolic CHF (congestive heart failure) (HCC)   Mixed hyperlipidemia   AKI (acute kidney injury) ([place])   Dehydration   Hyperkalemia   Hypoalbuminemia due to protein-calorie malnutrition (HCC)   Supratherapeutic INR   Lactic acidosis   Type 2 diabetes mellitus with hyperglycemia (HCC)   GERD (gastroesophageal reflux disease)  Assessment and Plan:   Severe sepsis, present on admission, secondary to left arm cellulitis Left foot wound; wound consult Patient was tachycardic and tachypneic (met SIRS criteria), source of infection was suspected to be cellulitis of left hand (met sepsis criteria).  Lactic acid was 4.9 > 5.8 (met severe sepsis criteria) Patient was started on IV cefepime, Flagyl and vancomycin, we shall continue vancomycin and cefepime IV hydration per sepsis protocol provided.  Continue IV hydration Continue wound care Continue Tylenol as needed Blood culture with no growth noted thus far, continue to monitor Continue to trend lactic acid in am  Supratherapeutic INR INR 7.4 Patient was not on warfarin Vitamin K 2.5 mg p.o. x1 was given Continue to  monitor INR DVT prophylaxis with SCDs for now Check ultrasound of right upper quadrant  Hypoalbuminemia possibly secondary to moderate protein calorie malnutrition Albumin 2.4, protein supplement to be provided  Type 2 diabetes mellitus with hyperglycemia Continue ISS and hypoglycemia protocol Glimepiride and metformin will be held at this time  Dehydration Acute kidney injury BUN/creatinine 30/1.27 (baseline creatinine at 0.7-1.1) IV hydration was provided Renally adjust medications, avoid nephrotoxic agents/dehydration/hypotension   Insomnia Continue melatonin  GERD Continue Protonix  Mixed hyperlipidemia Continue pravastatin   Chronic diastolic CHF Echocardiogram done on 05/08/2022 showed LVEF of 60 to 65% no RWMA.  Mild concentric LVH.  G1 DD Continue total input/output, daily weights and fluid restriction Continue Cardiac diet             Home Lasix and spironolactone on hold due to soft BP and dehydration   Essential hypertension BP meds will be held at this time due to soft BP    DVT prophylaxis: SCDs Code Status: Full Family Communication: None at bedside Disposition Plan:  Status is: Inpatient Remains inpatient appropriate because: Need for IV medications.   Skin Assessment:  I have examined the patient's skin and I agree with the wound assessment as performed by the wound care RN as outlined below:  Pressure Injury 07/29/22 Sacrum Stage 2 -  Partial thickness loss of dermis presenting as a shallow open injury with a red, pink wound bed without slough. stage 2 to sacrum measuring 2 cm x 1 cm (Active)  07/29/22 2300  Location: Sacrum  Location Orientation:   Staging: Stage  2 -  Partial thickness loss of dermis presenting as a shallow open injury with a red, pink wound bed without slough.  Wound Description (Comments): stage 2 to sacrum measuring 2 cm x 1 cm  Present on Admission: Yes  Dressing Type Foam - Lift dressing to assess site every shift 07/30/22  0756     Pressure Injury 07/29/22 Stage 2 -  Partial thickness loss of dermis presenting as a shallow open injury with a red, pink wound bed without slough. stage 2 to sacrum measuring 1 cm x 1 cm (Active)  07/29/22 2300  Location:   Location Orientation:   Staging: Stage 2 -  Partial thickness loss of dermis presenting as a shallow open injury with a red, pink wound bed without slough.  Wound Description (Comments): stage 2 to sacrum measuring 1 cm x 1 cm  Present on Admission: Yes  Dressing Type Foam - Lift dressing to assess site every shift 07/30/22 0756    Consultants:  None  Procedures:  None  Antimicrobials:  Anti-infectives (From admission, onward)    Start     Dose/Rate Route Frequency Ordered Stop   07/30/22 2000  vancomycin (VANCOREADY) IVPB 750 mg/150 mL  Status:  Discontinued        750 mg 150 mL/hr over 60 Minutes Intravenous Every 24 hours 07/29/22 1924 07/30/22 0918   07/30/22 2000  vancomycin (VANCOCIN) IVPB 1000 mg/200 mL premix        1,000 mg 200 mL/hr over 60 Minutes Intravenous Every 24 hours 07/30/22 0918     07/30/22 1800  ceFEPIme (MAXIPIME) 2 g in sodium chloride 0.9 % 100 mL IVPB        2 g 200 mL/hr over 30 Minutes Intravenous Every 12 hours 07/30/22 0908     07/30/22 0630  ceFEPIme (MAXIPIME) 1 g in sodium chloride 0.9 % 100 mL IVPB  Status:  Discontinued        1 g 200 mL/hr over 30 Minutes Intravenous Every 12 hours 07/29/22 1844 07/30/22 0908   07/29/22 1900  vancomycin (VANCOREADY) IVPB 1250 mg/250 mL        1,250 mg 166.7 mL/hr over 90 Minutes Intravenous  Once 07/29/22 1836 07/29/22 2216   07/29/22 1830  ceFEPIme (MAXIPIME) 2 g in sodium chloride 0.9 % 100 mL IVPB        2 g 200 mL/hr over 30 Minutes Intravenous  Once 07/29/22 1823 07/29/22 1902   07/29/22 1830  metroNIDAZOLE (FLAGYL) IVPB 500 mg        500 mg 100 mL/hr over 60 Minutes Intravenous  Once 07/29/22 1823 07/29/22 2024   07/29/22 1830  vancomycin (VANCOCIN) IVPB 1000 mg/200 mL  premix  Status:  Discontinued        1,000 mg 200 mL/hr over 60 Minutes Intravenous  Once 07/29/22 1823 07/29/22 1837      Subjective: Patient seen and evaluated today with no new acute complaints or concerns. No acute concerns or events noted overnight.  Objective: Vitals:   07/29/22 2108 07/29/22 2122 07/30/22 0100 07/30/22 0617  BP: 103/70  99/67 101/63  Pulse: (!) 105  99 93  Resp: _0 Temp: 98.3 F (36.8 C)  97.8 F (36.6 C) 97.8 F (36.6 C)  TempSrc:      SpO2: 100% 100% 98% 97%  Weight:    59.8 kg  Height:        Intake/Output Summary (Last 24 hours) at 07/30/2022 1119 Last data filed at 07/30/2022  0700 Gross per 24 hour  Intake 200 ml  Output 700 ml  Net -500 ml   Filed Weights   07/29/22 1714 07/30/22 0617  Weight: 59 kg 59.8 kg    Examination:  General exam: Appears calm and comfortable  Respiratory system: Clear to auscultation. Respiratory effort normal. Cardiovascular system: S1 & S2 heard, RRR.  Gastrointestinal system: Abdomen is soft Central nervous system: Alert and awake Extremities: Left arm erythema.  Left foot wounds with dressings clean dry and intact Skin: No significant lesions noted Psychiatry: Flat affect.    Data Reviewed: I have personally reviewed following labs and imaging studies  CBC: Recent Labs  Lab 07/29/22 1806 07/30/22 0410  WBC 6.4 8.5  NEUTROABS 4.9  --   HGB 13.4 11.6*  HCT 41.7 35.8*  MCV 98.8 98.6  PLT 162 809*   Basic Metabolic Panel: Recent Labs  Lab 07/29/22 1806 07/30/22 0410  NA 136 136  K 5.3* 4.5  CL 103 105  CO2 22 22  GLUCOSE 148* 187*  BUN 30* 31*  CREATININE 1.27* 1.32*  CALCIUM 7.7* 7.4*  MG  --  1.1*  PHOS  --  2.4*   GFR: Estimated Creatinine Clearance: 41.5 mL/min (A) (by C-G formula based on SCr of 1.32 mg/dL (H)). Liver Function Tests: Recent Labs  Lab 07/29/22 1806  AST 22  ALT 17  ALKPHOS 91  BILITOT 0.9  PROT 5.1*  ALBUMIN 2.4*   No results for input(s):  "LIPASE", "AMYLASE" in the last 168 hours. No results for input(s): "AMMONIA" in the last 168 hours. Coagulation Profile: Recent Labs  Lab 07/29/22 1908 07/30/22 0410  INR 7.4* 1.3*   Cardiac Enzymes: No results for input(s): "CKTOTAL", "CKMB", "CKMBINDEX", "TROPONINI" in the last 168 hours. BNP (last 3 results) No results for input(s): "PROBNP" in the last 8760 hours. HbA1C: Recent Labs    07/29/22 1806  HGBA1C 6.6*   CBG: Recent Labs  Lab 07/29/22 2226 07/30/22 0713  GLUCAP 200* 150*   Lipid Profile: No results for input(s): "CHOL", "HDL", "LDLCALC", "TRIG", "CHOLHDL", "LDLDIRECT" in the last 72 hours. Thyroid Function Tests: No results for input(s): "TSH", "T4TOTAL", "FREET4", "T3FREE", "THYROIDAB" in the last 72 hours. Anemia Panel: No results for input(s): "VITAMINB12", "FOLATE", "FERRITIN", "TIBC", "IRON", "RETICCTPCT" in the last 72 hours. Sepsis Labs: Recent Labs  Lab 07/29/22 1806 07/29/22 2039 07/29/22 2344 07/30/22 0410  LATICACIDVEN 4.9* 5.8* 4.9* 4.2*    Recent Results (from the past 240 hour(s))  Resp Panel by RT-PCR (Flu A&B, Covid) Anterior Nasal Swab     Status: None   Collection Time: 07/29/22  6:23 PM   Specimen: Anterior Nasal Swab  Result Value Ref Range Status   SARS Coronavirus 2 by RT PCR NEGATIVE NEGATIVE Final    Comment: (NOTE) SARS-CoV-2 target nucleic acids are NOT DETECTED.  The SARS-CoV-2 RNA is generally detectable in upper respiratory specimens during the acute phase of infection. The lowest concentration of SARS-CoV-2 viral copies this assay can detect is 138 copies/mL. A negative result does not preclude SARS-Cov-2 infection and should not be used as the sole basis for treatment or other patient management decisions. A negative result may occur with  improper specimen collection/handling, submission of specimen other than nasopharyngeal swab, presence of viral mutation(s) within the areas targeted by this assay, and  inadequate number of viral copies(<138 copies/mL). A negative result must be combined with clinical observations, patient history, and epidemiological information. The expected result is Negative.  Fact Sheet for Patients:  EntrepreneurPulse.com.au  Fact Sheet for Healthcare Providers:  IncredibleEmployment.be  This test is no t yet approved or cleared by the [place] FDA and  has been authorized for detection and/or diagnosis of SARS-CoV-2 by FDA under an Emergency Use Authorization (EUA). This EUA will remain  in effect (meaning this test can be used) for the duration of the COVID-19 declaration under Section 564(b)(1) of the Act, 21 U.S.C.section 360bbb-3(b)(1), unless the authorization is terminated  or revoked sooner.       Influenza A by PCR NEGATIVE NEGATIVE Final   Influenza B by PCR NEGATIVE NEGATIVE Final    Comment: (NOTE) The Xpert Xpress SARS-CoV-2/FLU/RSV plus assay is intended as an aid in the diagnosis of influenza from Nasopharyngeal swab specimens and should not be used as a sole basis for treatment. Nasal washings and aspirates are unacceptable for Xpert Xpress SARS-CoV-2/FLU/RSV testing.  Fact Sheet for Patients: EntrepreneurPulse.com.au  Fact Sheet for Healthcare Providers: IncredibleEmployment.be  This test is not yet approved or cleared by the [place] FDA and has been authorized for detection and/or diagnosis of SARS-CoV-2 by FDA under an Emergency Use Authorization (EUA). This EUA will remain in effect (meaning this test can be used) for the duration of the COVID-19 declaration under Section 564(b)(1) of the Act, 21 U.S.C. section 360bbb-3(b)(1), unless the authorization is terminated or revoked.  Performed at [facility], [address], [place], [place] [address]   Culture, blood (Routine x 2)     Status: None (Preliminary result)   Collection Time: 07/29/22  7:08  PM   Specimen: BLOOD RIGHT WRIST  Result Value Ref Range Status   Specimen Description BLOOD RIGHT WRIST  Final   Special Requests   Final    BOTTLES DRAWN AEROBIC AND ANAEROBIC Blood Culture adequate volume   Culture   Final    NO GROWTH < 24 HOURS Performed at [facility], [address] [place], [place] [address]    Report Status PENDING  Incomplete  Culture, blood (Routine x 2)     Status: None (Preliminary result)   Collection Time: 07/29/22  7:08 PM   Specimen: Right Antecubital; Blood  Result Value Ref Range Status   Specimen Description RIGHT ANTECUBITAL  Final   Special Requests   Final    BOTTLES DRAWN AEROBIC AND ANAEROBIC Blood Culture results may not be optimal due to an excessive volume of blood received in culture bottles   Culture   Final    NO GROWTH < 24 HOURS Performed at [facility], [address], [place], [place] [address]    Report Status PENDING  Incomplete         Radiology Studies: DG Chest Port 1 View  Result Date: 07/29/2022 CLINICAL DATA:  Left lower extremity peripheral swelling, possible sepsis, initial encounter EXAM: PORTABLE CHEST 1 VIEW COMPARISON:  03/21/2022 FINDINGS: Cardiac shadow is stable. 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 Aortic calcifications are seen. The lungs are well aerated bilaterally. Skin folds are noted over the right chest. No focal infiltrate or effusion is seen. No bony abnormality is noted. IMPRESSION: No acute abnormality noted. Electronically Signed   By: Inez Catalina M.D.   On: 07/29/2022 19:34        Scheduled Meds:  aspirin  81 mg Oral Daily   brimonidine  1 drop Both Eyes TID   feeding supplement (GLUCERNA SHAKE)  237 mL Oral TID BM   insulin aspart  0-9 Units Subcutaneous TID WC   latanoprost  1 drop  Both Eyes QHS   multivitamin with minerals  1 tablet Oral Q supper   pantoprazole  40 mg Oral Daily   pravastatin  40 mg Oral QPM   timolol  1 drop Both Eyes QPM   Continuous Infusions:  ceFEPime (MAXIPIME)  IV     vancomycin       LOS: 1 day    Time spent: 35 minutes    Savvas Roper Darleen Crocker, DO [facility]  If 7PM-7AM, please contact night-coverage [email or web address] 07/30/2022, 11:19 AM

## 2022-07-31 DIAGNOSIS — A419 Sepsis, unspecified organism: Secondary | ICD-10-CM | POA: Diagnosis not present

## 2022-07-31 DIAGNOSIS — L8990 Pressure ulcer of unspecified site, unspecified stage: Secondary | ICD-10-CM | POA: Insufficient documentation

## 2022-07-31 DIAGNOSIS — I5032 Chronic diastolic (congestive) heart failure: Secondary | ICD-10-CM | POA: Diagnosis not present

## 2022-07-31 DIAGNOSIS — E860 Dehydration: Secondary | ICD-10-CM | POA: Diagnosis not present

## 2022-07-31 DIAGNOSIS — N179 Acute kidney failure, unspecified: Secondary | ICD-10-CM | POA: Diagnosis not present

## 2022-07-31 LAB — MAGNESIUM: Magnesium: 1.5 mg/dL — ABNORMAL LOW (ref 1.7–2.4)

## 2022-07-31 LAB — LACTIC ACID, PLASMA: Lactic Acid, Venous: 0.9 mmol/L (ref 0.5–1.9)

## 2022-07-31 LAB — BASIC METABOLIC PANEL
Anion gap: 3 — ABNORMAL LOW (ref 5–15)
BUN: 28 mg/dL — ABNORMAL HIGH (ref 8–23)
CO2: 27 mmol/L (ref 22–32)
Calcium: 7.6 mg/dL — ABNORMAL LOW (ref 8.9–10.3)
Chloride: 106 mmol/L (ref 98–111)
Creatinine, Ser: 1.04 mg/dL (ref 0.61–1.24)
GFR, Estimated: >60 mL/min (ref >60)
Glucose, Bld: 69 mg/dL — ABNORMAL LOW (ref 70–99)
Potassium: 4.2 mmol/L (ref 3.5–5.1)
Sodium: 136 mmol/L (ref 135–145)

## 2022-07-31 LAB — URINE CULTURE

## 2022-07-31 LAB — PROTIME-INR
INR: 1.6 — ABNORMAL HIGH (ref 0.8–1.2)
Prothrombin Time: 18.5 seconds — ABNORMAL HIGH (ref 11.4–15.2)

## 2022-07-31 LAB — GLUCOSE, CAPILLARY
Glucose-Capillary: 70 mg/dL (ref 70–99)
Glucose-Capillary: 187 mg/dL — ABNORMAL HIGH (ref 70–99)
Glucose-Capillary: 171 mg/dL — ABNORMAL HIGH (ref 70–99)
Glucose-Capillary: 207 mg/dL — ABNORMAL HIGH (ref 70–99)

## 2022-07-31 LAB — CBC
HCT: 33.6 % — ABNORMAL LOW (ref 39.0–52.0)
Hemoglobin: 10.8 g/dL — ABNORMAL LOW (ref 13.0–17.0)
MCH: 31.4 pg (ref 26.0–34.0)
MCHC: 32.1 g/dL (ref 30.0–36.0)
MCV: 97.7 fL (ref 80.0–100.0)
Platelets: 133 10*3/uL — ABNORMAL LOW (ref 150–400)
RBC: 3.44 MIL/uL — ABNORMAL LOW (ref 4.22–5.81)
RDW: 15.5 % (ref 11.5–15.5)
WBC: 8.2 10*3/uL (ref 4.0–10.5)
nRBC: 0 % (ref 0.0–0.2)

## 2022-07-31 MED ORDER — MAGNESIUM SULFATE 4 GM/100ML IV SOLN
4.0000 g | Freq: Once | INTRAVENOUS | Status: AC
  Administered 2022-07-31: 4 g via INTRAVENOUS
  Filled 2022-07-31: qty 100

## 2022-07-31 NOTE — Progress Notes (Signed)
Patient switched to air mattress. Wife at bedside.

## 2022-07-31 NOTE — Progress Notes (Signed)
PROGRESS NOTE    Patrick Beck  PRF:[id number] DOB: [birth year] DOA: 07/29/2022 PCP: Lemmie Evens, MD   Brief Narrative:    Patrick Beck is a 76 y.o. male with medical history significant of CAD, hypertension, T2DM, GERD, severe aortic stenosis s/p TAVR who presents to the emergency department who presents to the emergency department due to 2-day onset of redness on the dorsum of left hand which has since spread to the left arm and this is associated with left arm swelling, he also complained of left foot swelling with fluid leaking out of the foot since last 3 days.  Patient was admitted for severe sepsis, present on admission secondary to cellulitis to his left arm.  Assessment & Plan:   Principal Problem:   Severe sepsis ([place]) Active Problems:   Essential hypertension   Left arm cellulitis   Insomnia   Chronic diastolic CHF (congestive heart failure) (HCC)   Mixed hyperlipidemia   AKI (acute kidney injury) ([place])   Dehydration   Hyperkalemia   Hypoalbuminemia due to protein-calorie malnutrition (HCC)   Supratherapeutic INR   Lactic acidosis   Type 2 diabetes mellitus with hyperglycemia (HCC)   GERD (gastroesophageal reflux disease)   Pressure injury of skin  Assessment and Plan:   Severe sepsis, present on admission, secondary to left arm cellulitis Left foot wound; wound consulted and wound care instructions being implemented Patient was tachycardic and tachypneic (met SIRS criteria), source of infection was suspected to be cellulitis of left hand (met sepsis criteria).  Lactic acid was 4.9 > 5.8 (met severe sepsis criteria) Sepsis physiology has resolved now.  Patient was started on IV cefepime, Flagyl and vancomycin IV hydration per sepsis protocol provided.  Continue IV hydration Continue wound care Continue Tylenol as needed Blood culture with no growth noted thus far, continue to monitor Continue to trend lactic acid in am  Cellulitis of left arm  Improving on IV  antibiotics but not yet under control as it remains red, hot but edema is improving  Supratherapeutic INR (cause unknown) INR 7.4 on admission, now improving down to 1.5 Patient was not on warfarin Vitamin K 2.5 mg p.o. x1 was given Continue to monitor INR DVT prophylaxis with SCDs for now Check ultrasound of right upper quadrant - no significant liver abnormalities noted.   Hypoalbuminemia possibly secondary to moderate protein calorie malnutrition Albumin 2.4, protein supplement to be provided  Type 2 diabetes mellitus with hyperglycemia Continue ISS and hypoglycemia protocol Glimepiride and metformin will be held at this time  Dehydration Acute kidney injury BUN/creatinine 30/1.27 (baseline creatinine at 0.7-1.1) IV hydration was provided Renally adjust medications, avoid nephrotoxic agents/dehydration/hypotension   Insomnia Continue melatonin  GERD Continue Protonix  Mixed hyperlipidemia Continue pravastatin   Chronic diastolic CHF Echocardiogram done on 05/08/2022 showed LVEF of 60 to 65% no RWMA.  Mild concentric LVH.  G1 DD Continue total input/output, daily weights and fluid restriction Continue Cardiac diet             Home Lasix and spironolactone on hold due to soft BP and dehydration   Essential hypertension BP meds will be held at this time due to soft BP    DVT prophylaxis: SCDs Code Status: Full Family Communication: None at bedside Disposition Plan:  Status is: Inpatient Remains inpatient appropriate because: Need for IV medications.   Skin Assessment:  I have examined the patient's skin and I agree with the wound assessment as performed by the wound care RN as outlined below:  Pressure Injury 07/29/22 Sacrum Stage 2 -  Partial thickness loss of dermis presenting as a shallow open injury with a red, pink wound bed without slough. stage 2 to sacrum measuring 2 cm x 1 cm (Active)  07/29/22 2300  Location: Sacrum  Location Orientation:   Staging:  Stage 2 -  Partial thickness loss of dermis presenting as a shallow open injury with a red, pink wound bed without slough.  Wound Description (Comments): stage 2 to sacrum measuring 2 cm x 1 cm  Present on Admission: Yes  Dressing Type Foam - Lift dressing to assess site every shift 07/31/22 0817     Pressure Injury 07/29/22 Stage 2 -  Partial thickness loss of dermis presenting as a shallow open injury with a red, pink wound bed without slough. stage 2 to sacrum measuring 1 cm x 1 cm (Active)  07/29/22 2300  Location:   Location Orientation:   Staging: Stage 2 -  Partial thickness loss of dermis presenting as a shallow open injury with a red, pink wound bed without slough.  Wound Description (Comments): stage 2 to sacrum measuring 1 cm x 1 cm  Present on Admission: Yes  Dressing Type Foam - Lift dressing to assess site every shift 07/31/22 0817    Consultants:  None  Procedures:  None  Antimicrobials:  Anti-infectives (From admission, onward)    Start     Dose/Rate Route Frequency Ordered Stop   07/30/22 2000  vancomycin (VANCOREADY) IVPB 750 mg/150 mL  Status:  Discontinued        750 mg 150 mL/hr over 60 Minutes Intravenous Every 24 hours 07/29/22 1924 07/30/22 0918   07/30/22 2000  vancomycin (VANCOCIN) IVPB 1000 mg/200 mL premix        1,000 mg 200 mL/hr over 60 Minutes Intravenous Every 24 hours 07/30/22 0918     07/30/22 1800  ceFEPIme (MAXIPIME) 2 g in sodium chloride 0.9 % 100 mL IVPB        2 g 200 mL/hr over 30 Minutes Intravenous Every 12 hours 07/30/22 0908     07/30/22 0630  ceFEPIme (MAXIPIME) 1 g in sodium chloride 0.9 % 100 mL IVPB  Status:  Discontinued        1 g 200 mL/hr over 30 Minutes Intravenous Every 12 hours 07/29/22 1844 07/30/22 0908   07/29/22 1900  vancomycin (VANCOREADY) IVPB 1250 mg/250 mL        1,250 mg 166.7 mL/hr over 90 Minutes Intravenous  Once 07/29/22 1836 07/29/22 2216   07/29/22 1830  ceFEPIme (MAXIPIME) 2 g in sodium chloride 0.9 %  100 mL IVPB        2 g 200 mL/hr over 30 Minutes Intravenous  Once 07/29/22 1823 07/29/22 1902   07/29/22 1830  metroNIDAZOLE (FLAGYL) IVPB 500 mg        500 mg 100 mL/hr over 60 Minutes Intravenous  Once 07/29/22 1823 07/29/22 2024   07/29/22 1830  vancomycin (VANCOCIN) IVPB 1000 mg/200 mL premix  Status:  Discontinued        1,000 mg 200 mL/hr over 60 Minutes Intravenous  Once 07/29/22 1823 07/29/22 1837      Subjective: Patient seen and evaluated today with no new acute complaints or concerns. No acute concerns or events noted overnight.  Objective: Vitals:   07/30/22 1442 07/30/22 2209 07/31/22 0401 07/31/22 0500  BP: 109/60 (!) 86/51 (!) 92/57   Pulse: 100 62 73   Resp: 18 20 20    Temp: 98.2 F (  36.8 C) 97.6 F (36.4 C) 98 F (36.7 C)   TempSrc: Oral Oral Oral   SpO2: 96% 95% 95%   Weight:    63.1 kg  Height:        Intake/Output Summary (Last 24 hours) at 07/31/2022 1004 Last data filed at 07/31/2022 0901 Gross per 24 hour  Intake 880 ml  Output 1500 ml  Net -620 ml   Filed Weights   07/29/22 1714 07/30/22 0617 07/31/22 0500  Weight: 59 kg 59.8 kg 63.1 kg    Examination:  General exam: Appears calm and comfortable, NAD.  Respiratory system: Clear to auscultation. Respiratory effort normal. Cardiovascular system: S1 & S2 heard, RRR.  Gastrointestinal system: Abdomen is soft Central nervous system: Alert and awake Extremities: Left arm erythema and heat persists but edema improving.  Left foot wounds with dressings clean dry and intact Skin: No significant lesions noted Psychiatry: normal mood.   Data Reviewed: I have personally reviewed following labs and imaging studies  CBC: Recent Labs  Lab 07/29/22 1806 07/30/22 0410 07/31/22 0604  WBC 6.4 8.5 8.2  NEUTROABS 4.9  --   --   HGB 13.4 11.6* 10.8*  HCT 41.7 35.8* 33.6*  MCV 98.8 98.6 97.7  PLT 162 140* 413*   Basic Metabolic Panel: Recent Labs  Lab 07/29/22 1806 07/30/22 0410 07/31/22 0604   NA 136 136 136  K 5.3* 4.5 4.2  CL 103 105 106  CO2 22 22 27   GLUCOSE 148* 187* 69*  BUN 30* 31* 28*  CREATININE 1.27* 1.32* 1.04  CALCIUM 7.7* 7.4* 7.6*  MG  --  1.1* 1.5*  PHOS  --  2.4*  --    GFR: Estimated Creatinine Clearance: 55.6 mL/min (by C-G formula based on SCr of 1.04 mg/dL). Liver Function Tests: Recent Labs  Lab 07/29/22 1806  AST 22  ALT 17  ALKPHOS 91  BILITOT 0.9  PROT 5.1*  ALBUMIN 2.4*   No results for input(s): "LIPASE", "AMYLASE" in the last 168 hours. No results for input(s): "AMMONIA" in the last 168 hours. Coagulation Profile: Recent Labs  Lab 07/29/22 1908 07/30/22 0410 07/31/22 0604  INR 7.4* 1.3* 1.6*   Cardiac Enzymes: No results for input(s): "CKTOTAL", "CKMB", "CKMBINDEX", "TROPONINI" in the last 168 hours. BNP (last 3 results) No results for input(s): "PROBNP" in the last 8760 hours. HbA1C: Recent Labs    07/29/22 1806  HGBA1C 6.6*   CBG: Recent Labs  Lab 07/30/22 0713 07/30/22 1137 07/30/22 1714 07/30/22 2206 07/31/22 0711  GLUCAP 150* 226* 160* 114* 70   Lipid Profile: No results for input(s): "CHOL", "HDL", "LDLCALC", "TRIG", "CHOLHDL", "LDLDIRECT" in the last 72 hours. Thyroid Function Tests: No results for input(s): "TSH", "T4TOTAL", "FREET4", "T3FREE", "THYROIDAB" in the last 72 hours. Anemia Panel: No results for input(s): "VITAMINB12", "FOLATE", "FERRITIN", "TIBC", "IRON", "RETICCTPCT" in the last 72 hours. Sepsis Labs: Recent Labs  Lab 07/29/22 2039 07/29/22 2344 07/30/22 0410 07/31/22 0604  LATICACIDVEN 5.8* 4.9* 4.2* 0.9    Recent Results (from the past 240 hour(s))  Resp Panel by RT-PCR (Flu A&B, Covid) Anterior Nasal Swab     Status: None   Collection Time: 07/29/22  6:23 PM   Specimen: Anterior Nasal Swab  Result Value Ref Range Status   SARS Coronavirus 2 by RT PCR NEGATIVE NEGATIVE Final    Comment: (NOTE) SARS-CoV-2 target nucleic acids are NOT DETECTED.  The SARS-CoV-2 RNA is generally  detectable in upper respiratory specimens during the acute phase of infection. The  lowest concentration of SARS-CoV-2 viral copies this assay can detect is 138 copies/mL. A negative result does not preclude SARS-Cov-2 infection and should not be used as the sole basis for treatment or other patient management decisions. A negative result may occur with  improper specimen collection/handling, submission of specimen other than nasopharyngeal swab, presence of viral mutation(s) within the areas targeted by this assay, and inadequate number of viral copies(<138 copies/mL). A negative result must be combined with clinical observations, patient history, and epidemiological information. The expected result is Negative.  Fact Sheet for Patients:  EntrepreneurPulse.com.au  Fact Sheet for Healthcare Providers:  IncredibleEmployment.be  This test is no t yet approved or cleared by the [place] FDA and  has been authorized for detection and/or diagnosis of SARS-CoV-2 by FDA under an Emergency Use Authorization (EUA). This EUA will remain  in effect (meaning this test can be used) for the duration of the COVID-19 declaration under Section 564(b)(1) of the Act, 21 U.S.C.section 360bbb-3(b)(1), unless the authorization is terminated  or revoked sooner.       Influenza A by PCR NEGATIVE NEGATIVE Final   Influenza B by PCR NEGATIVE NEGATIVE Final    Comment: (NOTE) The Xpert Xpress SARS-CoV-2/FLU/RSV plus assay is intended as an aid in the diagnosis of influenza from Nasopharyngeal swab specimens and should not be used as a sole basis for treatment. Nasal washings and aspirates are unacceptable for Xpert Xpress SARS-CoV-2/FLU/RSV testing.  Fact Sheet for Patients: EntrepreneurPulse.com.au  Fact Sheet for Healthcare Providers: IncredibleEmployment.be  This test is not yet approved or cleared by the [place] FDA  and has been authorized for detection and/or diagnosis of SARS-CoV-2 by FDA under an Emergency Use Authorization (EUA). This EUA will remain in effect (meaning this test can be used) for the duration of the COVID-19 declaration under Section 564(b)(1) of the Act, 21 U.S.C. section 360bbb-3(b)(1), unless the authorization is terminated or revoked.  Performed at [facility], [address] [place], [place] [address]   Culture, blood (Routine x 2)     Status: None (Preliminary result)   Collection Time: 07/29/22  7:08 PM   Specimen: BLOOD RIGHT WRIST  Result Value Ref Range Status   Specimen Description BLOOD RIGHT WRIST  Final   Special Requests   Final    BOTTLES DRAWN AEROBIC AND ANAEROBIC Blood Culture adequate volume   Culture   Final    NO GROWTH 2 DAYS Performed at [facility], [address], [place], [place] [address]    Report Status PENDING  Incomplete  Culture, blood (Routine x 2)     Status: None (Preliminary result)   Collection Time: 07/29/22  7:08 PM   Specimen: Right Antecubital; Blood  Result Value Ref Range Status   Specimen Description RIGHT ANTECUBITAL  Final   Special Requests   Final    BOTTLES DRAWN AEROBIC AND ANAEROBIC Blood Culture results may not be optimal due to an excessive volume of blood received in culture bottles   Culture   Final    NO GROWTH 2 DAYS Performed at [facility], [address] [place], [place] [address]    Report Status PENDING  Incomplete    Radiology Studies: US Abdomen Limited RUQ (LIVER/GB)  Result Date: 07/30/2022 CLINICAL DATA:  Supratherapeutic INR. EXAM: ULTRASOUND ABDOMEN LIMITED RIGHT UPPER QUADRANT COMPARISON:  CT abdomen pelvis 03/21/2022 FINDINGS: Gallbladder: Negative for gallstones.  Gallbladder wall 3 mm, upper normal. Common bile duct: Diameter: 3.2 mm Liver: No focal lesion identified. Within normal  limits in parenchymal echogenicity. Small cyst left lobe liver is identified on recent CT. Portal vein is patent on  color Doppler imaging with normal direction of blood flow towards the liver. Other: Trace right pleural effusion IMPRESSION: Negative for gallstones or biliary dilatation. Gallbladder wall upper normal. No focal liver lesion. Electronically Signed   By: Franchot Gallo M.D.   On: 07/30/2022 12:52   DG Chest Port 1 View  Result Date: 07/29/2022 CLINICAL DATA:  Left lower extremity peripheral swelling, possible sepsis, initial encounter EXAM: PORTABLE CHEST 1 VIEW COMPARISON:  03/21/2022 FINDINGS: Cardiac shadow is stable. 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 Aortic calcifications are seen. The lungs are well aerated bilaterally. Skin folds are noted over the right chest. No focal infiltrate or effusion is seen. No bony abnormality is noted. IMPRESSION: No acute abnormality noted. Electronically Signed   By: Inez Catalina M.D.   On: 07/29/2022 19:34    Scheduled Meds:  aspirin  81 mg Oral Daily   brimonidine  1 drop Both Eyes TID   feeding supplement (GLUCERNA SHAKE)  237 mL Oral TID BM   insulin aspart  0-9 Units Subcutaneous TID WC   latanoprost  1 drop Both Eyes QHS   multivitamin with minerals  1 tablet Oral Q supper   pantoprazole  40 mg Oral Daily   pravastatin  40 mg Oral QPM   timolol  1 drop Both Eyes QPM   Continuous Infusions:  ceFEPime (MAXIPIME) IV Stopped (07/31/22 0604)   magnesium sulfate bolus IVPB 4 g (07/31/22 0859)   vancomycin Stopped (07/30/22 2046)    LOS: 2 days   Time spent: 35 minutes  Laquinda Moller, MD [facility]  If 7PM-7AM, please contact night-coverage [email or web address] 07/31/2022, 10:04 AM

## 2022-08-01 DIAGNOSIS — E860 Dehydration: Secondary | ICD-10-CM | POA: Diagnosis not present

## 2022-08-01 DIAGNOSIS — A419 Sepsis, unspecified organism: Secondary | ICD-10-CM | POA: Diagnosis not present

## 2022-08-01 DIAGNOSIS — N179 Acute kidney failure, unspecified: Secondary | ICD-10-CM | POA: Diagnosis not present

## 2022-08-01 DIAGNOSIS — I5032 Chronic diastolic (congestive) heart failure: Secondary | ICD-10-CM | POA: Diagnosis not present

## 2022-08-01 LAB — CBC
HCT: 37.8 % — ABNORMAL LOW (ref 39.0–52.0)
Hemoglobin: 12 g/dL — ABNORMAL LOW (ref 13.0–17.0)
MCH: 32.4 pg (ref 26.0–34.0)
MCHC: 31.7 g/dL (ref 30.0–36.0)
MCV: 102.2 fL — ABNORMAL HIGH (ref 80.0–100.0)
Platelets: 151 10*3/uL (ref 150–400)
RBC: 3.7 MIL/uL — ABNORMAL LOW (ref 4.22–5.81)
RDW: 15.5 % (ref 11.5–15.5)
WBC: 7.6 10*3/uL (ref 4.0–10.5)
nRBC: 0 % (ref 0.0–0.2)

## 2022-08-01 LAB — BASIC METABOLIC PANEL
Anion gap: 5 (ref 5–15)
BUN: 29 mg/dL — ABNORMAL HIGH (ref 8–23)
CO2: 25 mmol/L (ref 22–32)
Calcium: 7.6 mg/dL — ABNORMAL LOW (ref 8.9–10.3)
Chloride: 104 mmol/L (ref 98–111)
Creatinine, Ser: 1.14 mg/dL (ref 0.61–1.24)
GFR, Estimated: >60 mL/min (ref >60)
Glucose, Bld: 177 mg/dL — ABNORMAL HIGH (ref 70–99)
Potassium: 4.4 mmol/L (ref 3.5–5.1)
Sodium: 134 mmol/L — ABNORMAL LOW (ref 135–145)

## 2022-08-01 LAB — GLUCOSE, CAPILLARY
Glucose-Capillary: 150 mg/dL — ABNORMAL HIGH (ref 70–99)
Glucose-Capillary: 172 mg/dL — ABNORMAL HIGH (ref 70–99)
Glucose-Capillary: 201 mg/dL — ABNORMAL HIGH (ref 70–99)
Glucose-Capillary: 314 mg/dL — ABNORMAL HIGH (ref 70–99)

## 2022-08-01 LAB — MAGNESIUM: Magnesium: 2.5 mg/dL — ABNORMAL HIGH (ref 1.7–2.4)

## 2022-08-01 LAB — PROCALCITONIN: Procalcitonin: 0.51 ng/mL

## 2022-08-01 LAB — PROTIME-INR
INR: 1.2 (ref 0.8–1.2)
Prothrombin Time: 15 seconds (ref 11.4–15.2)

## 2022-08-01 LAB — MRSA NEXT GEN BY PCR, NASAL: MRSA by PCR Next Gen: NOT DETECTED

## 2022-08-01 MED ORDER — GUAIFENESIN ER 600 MG PO TB12
1200.0000 mg | ORAL_TABLET | Freq: Two times a day (BID) | ORAL | Status: DC
  Administered 2022-08-01 – 2022-08-04 (×7): 1200 mg via ORAL
  Filled 2022-08-01 (×8): qty 2

## 2022-08-01 NOTE — Progress Notes (Signed)
PROGRESS NOTE    [name]  OIN:[id number] DOB: [birth year] DOA: 07/29/2022 PCP: Lemmie Evens, MD   Brief Narrative:    Patrick Beck is a 76 y.o. male with medical history significant of CAD, hypertension, T2DM, GERD, severe aortic stenosis s/p TAVR who presents to the emergency department who presents to the emergency department due to 2-day onset of redness on the dorsum of left hand which has since spread to the left arm and this is associated with left arm swelling, he also complained of left foot swelling with fluid leaking out of the foot since last 3 days.  Patient was admitted for severe sepsis, present on admission secondary to cellulitis to his left arm.  Assessment & Plan:   Principal Problem:   Severe sepsis ([place]) Active Problems:   Essential hypertension   Left arm cellulitis   Insomnia   Chronic diastolic CHF (congestive heart failure) (HCC)   Mixed hyperlipidemia   AKI (acute kidney injury) ([place])   Dehydration   Hyperkalemia   Hypoalbuminemia due to protein-calorie malnutrition (HCC)   Supratherapeutic INR   Lactic acidosis   Type 2 diabetes mellitus with hyperglycemia (HCC)   GERD (gastroesophageal reflux disease)   Pressure injury of skin  Assessment and Plan:  Severe sepsis, present on admission, secondary to left arm cellulitis Left foot wound; wound consulted and wound care instructions being implemented Patient was tachycardic and tachypneic (met SIRS criteria), source of infection was suspected to be cellulitis of left hand (met sepsis criteria).  Lactic acid was 4.9 > 5.8 (met severe sepsis criteria) Sepsis physiology has resolved now.  Patient was started on IV cefepime, Flagyl and vancomycin IV hydration per sepsis protocol provided.  Continue IV hydration Continue wound care Continue Tylenol as needed Blood culture with no growth noted thus far, continue to monitor Continue to trend lactic acid in am  Cellulitis of left arm  Improving on IV  antibiotics but not yet under control as it remains red, hot but edema is improving, I think 1 more day of IV antibiotics and he will be able to transition to oral antibiotics and likely discharge back to SNF.    Supratherapeutic INR (cause unknown) INR 7.4 on admission, now improving down to 1.5 Patient was not on warfarin Vitamin K 2.5 mg p.o. x1 was given Continue to monitor INR DVT prophylaxis with SCDs for now Check ultrasound of right upper quadrant - no significant liver abnormalities noted.   Hypoalbuminemia possibly secondary to moderate protein calorie malnutrition Albumin 2.4, protein supplement to be provided  Type 2 diabetes mellitus with hyperglycemia Continue ISS and hypoglycemia protocol Glimepiride and metformin will be held at this time CBG (last 3)  Recent Labs    07/31/22 2127 08/01/22 0720 08/01/22 1109  GLUCAP 207* 150* 172*    Dehydration Acute kidney injury BUN/creatinine 30/1.27 (baseline creatinine at 0.7-1.1) IV hydration was provided Renally adjust medications, avoid nephrotoxic agents/dehydration/hypotension   Insomnia Continue melatonin  GERD Continue Protonix  Mixed hyperlipidemia Continue pravastatin   Chronic diastolic CHF Echocardiogram done on 05/08/2022 showed LVEF of 60 to 65% no RWMA.  Mild concentric LVH.  G1 DD Continue total input/output, daily weights and fluid restriction Continue Cardiac diet             Home Lasix and spironolactone on hold due to soft BP and dehydration   Essential hypertension BP meds will be held at this time due to soft BP    DVT prophylaxis: SCDs Code Status: Full  Family Communication: None at bedside Disposition Plan: return to LTC SNF  Status is: Inpatient Remains inpatient appropriate because: Need for IV medications.   Skin Assessment:  I have examined the patient's skin and I agree with the wound assessment as performed by the wound care RN as outlined below:  Pressure Injury 07/29/22  Sacrum Stage 2 -  Partial thickness loss of dermis presenting as a shallow open injury with a red, pink wound bed without slough. stage 2 to sacrum measuring 2 cm x 1 cm (Active)  07/29/22 2300  Location: Sacrum  Location Orientation:   Staging: Stage 2 -  Partial thickness loss of dermis presenting as a shallow open injury with a red, pink wound bed without slough.  Wound Description (Comments): stage 2 to sacrum measuring 2 cm x 1 cm  Present on Admission: Yes  Dressing Type Foam - Lift dressing to assess site every shift 08/01/22 0800     Pressure Injury 07/29/22 Stage 2 -  Partial thickness loss of dermis presenting as a shallow open injury with a red, pink wound bed without slough. stage 2 to sacrum measuring 1 cm x 1 cm (Active)  07/29/22 2300  Location:   Location Orientation:   Staging: Stage 2 -  Partial thickness loss of dermis presenting as a shallow open injury with a red, pink wound bed without slough.  Wound Description (Comments): stage 2 to sacrum measuring 1 cm x 1 cm  Present on Admission: Yes  Dressing Type Foam - Lift dressing to assess site every shift 08/01/22 0800    Consultants:  None  Procedures:  None  Antimicrobials:  Anti-infectives (From admission, onward)    Start     Dose/Rate Route Frequency Ordered Stop   07/30/22 2000  vancomycin (VANCOREADY) IVPB 750 mg/150 mL  Status:  Discontinued        750 mg 150 mL/hr over 60 Minutes Intravenous Every 24 hours 07/29/22 1924 07/30/22 0918   07/30/22 2000  vancomycin (VANCOCIN) IVPB 1000 mg/200 mL premix        1,000 mg 200 mL/hr over 60 Minutes Intravenous Every 24 hours 07/30/22 0918     07/30/22 1800  ceFEPIme (MAXIPIME) 2 g in sodium chloride 0.9 % 100 mL IVPB        2 g 200 mL/hr over 30 Minutes Intravenous Every 12 hours 07/30/22 0908     07/30/22 0630  ceFEPIme (MAXIPIME) 1 g in sodium chloride 0.9 % 100 mL IVPB  Status:  Discontinued        1 g 200 mL/hr over 30 Minutes Intravenous Every 12 hours  07/29/22 1844 07/30/22 0908   07/29/22 1900  vancomycin (VANCOREADY) IVPB 1250 mg/250 mL        1,250 mg 166.7 mL/hr over 90 Minutes Intravenous  Once 07/29/22 1836 07/29/22 2216   07/29/22 1830  ceFEPIme (MAXIPIME) 2 g in sodium chloride 0.9 % 100 mL IVPB        2 g 200 mL/hr over 30 Minutes Intravenous  Once 07/29/22 1823 07/29/22 1902   07/29/22 1830  metroNIDAZOLE (FLAGYL) IVPB 500 mg        500 mg 100 mL/hr over 60 Minutes Intravenous  Once 07/29/22 1823 07/29/22 2024   07/29/22 1830  vancomycin (VANCOCIN) IVPB 1000 mg/200 mL premix  Status:  Discontinued        1,000 mg 200 mL/hr over 60 Minutes Intravenous  Once 07/29/22 1823 07/29/22 1837      Subjective: Patient reports less edema  in left arm today and increasing mobility in left arm.  Objective: Vitals:   07/31/22 2121 08/01/22 0335 08/01/22 0427 08/01/22 0503  BP: (!) 94/55   102/64  Pulse: (!) 59   67  Resp: 15   20  Temp: 98 F (36.7 C)   97.9 F (36.6 C)  TempSrc:      SpO2: 94% 90%  96%  Weight:   59.1 kg   Height:        Intake/Output Summary (Last 24 hours) at 08/01/2022 1340 Last data filed at 08/01/2022 1124 Gross per 24 hour  Intake 1680 ml  Output 725 ml  Net 955 ml   Filed Weights   07/30/22 0617 07/31/22 0500 08/01/22 0427  Weight: 59.8 kg 63.1 kg 59.1 kg    Examination:  General exam: Appears calm and comfortable, NAD.  Respiratory system: Clear to auscultation. Respiratory effort normal. Cardiovascular system: S1 & S2 heard, RRR.  Gastrointestinal system: Abdomen is soft Central nervous system: Alert and awake Extremities: Left arm erythema and heat persists but edema improving.  Left foot wounds with dressings clean dry and intact Skin: No significant lesions noted Psychiatry: normal mood.  Data Reviewed: I have personally reviewed following labs and imaging studies  CBC: Recent Labs  Lab 07/29/22 1806 07/30/22 0410 07/31/22 0604 08/01/22 0456  WBC 6.4 8.5 8.2 7.6  NEUTROABS 4.9   --   --   --   HGB 13.4 11.6* 10.8* 12.0*  HCT 41.7 35.8* 33.6* 37.8*  MCV 98.8 98.6 97.7 102.2*  PLT 162 140* 133* 962   Basic Metabolic Panel: Recent Labs  Lab 07/29/22 1806 07/30/22 0410 07/31/22 0604 08/01/22 0456  NA 136 136 136 134*  K 5.3* 4.5 4.2 4.4  CL 103 105 106 104  CO2 22 22 27 25   GLUCOSE 148* 187* 69* 177*  BUN 30* 31* 28* 29*  CREATININE 1.27* 1.32* 1.04 1.14  CALCIUM 7.7* 7.4* 7.6* 7.6*  MG  --  1.1* 1.5* 2.5*  PHOS  --  2.4*  --   --    GFR: Estimated Creatinine Clearance: 47.5 mL/min (by C-G formula based on SCr of 1.14 mg/dL). Liver Function Tests: Recent Labs  Lab 07/29/22 1806  AST 22  ALT 17  ALKPHOS 91  BILITOT 0.9  PROT 5.1*  ALBUMIN 2.4*   No results for input(s): "LIPASE", "AMYLASE" in the last 168 hours. No results for input(s): "AMMONIA" in the last 168 hours. Coagulation Profile: Recent Labs  Lab 07/29/22 1908 07/30/22 0410 07/31/22 0604 08/01/22 0456  INR 7.4* 1.3* 1.6* 1.2   Cardiac Enzymes: No results for input(s): "CKTOTAL", "CKMB", "CKMBINDEX", "TROPONINI" in the last 168 hours. BNP (last 3 results) No results for input(s): "PROBNP" in the last 8760 hours. HbA1C: Recent Labs    07/29/22 1806  HGBA1C 6.6*   CBG: Recent Labs  Lab 07/31/22 1105 07/31/22 1547 07/31/22 2127 08/01/22 0720 08/01/22 1109  GLUCAP 187* 171* 207* 150* 172*   Lipid Profile: No results for input(s): "CHOL", "HDL", "LDLCALC", "TRIG", "CHOLHDL", "LDLDIRECT" in the last 72 hours. Thyroid Function Tests: No results for input(s): "TSH", "T4TOTAL", "FREET4", "T3FREE", "THYROIDAB" in the last 72 hours. Anemia Panel: No results for input(s): "VITAMINB12", "FOLATE", "FERRITIN", "TIBC", "IRON", "RETICCTPCT" in the last 72 hours. Sepsis Labs: Recent Labs  Lab 07/29/22 2039 07/29/22 2344 07/30/22 0410 07/31/22 0604 08/01/22 0456  PROCALCITON  --   --   --   --  0.51  LATICACIDVEN 5.8* 4.9* 4.2* 0.9  --  Recent Results (from the past  240 hour(s))  Resp Panel by RT-PCR (Flu A&B, Covid) Anterior Nasal Swab     Status: None   Collection Time: 07/29/22  6:23 PM   Specimen: Anterior Nasal Swab  Result Value Ref Range Status   SARS Coronavirus 2 by RT PCR NEGATIVE NEGATIVE Final    Comment: (NOTE) SARS-CoV-2 target nucleic acids are NOT DETECTED.  The SARS-CoV-2 RNA is generally detectable in upper respiratory specimens during the acute phase of infection. The lowest concentration of SARS-CoV-2 viral copies this assay can detect is 138 copies/mL. A negative result does not preclude SARS-Cov-2 infection and should not be used as the sole basis for treatment or other patient management decisions. A negative result may occur with  improper specimen collection/handling, submission of specimen other than nasopharyngeal swab, presence of viral mutation(s) within the areas targeted by this assay, and inadequate number of viral copies(<138 copies/mL). A negative result must be combined with clinical observations, patient history, and epidemiological information. The expected result is Negative.  Fact Sheet for Patients:  EntrepreneurPulse.com.au  Fact Sheet for Healthcare Providers:  IncredibleEmployment.be  This test is no t yet approved or cleared by the [place] FDA and  has been authorized for detection and/or diagnosis of SARS-CoV-2 by FDA under an Emergency Use Authorization (EUA). This EUA will remain  in effect (meaning this test can be used) for the duration of the COVID-19 declaration under Section 564(b)(1) of the Act, 21 U.S.C.section 360bbb-3(b)(1), unless the authorization is terminated  or revoked sooner.       Influenza A by PCR NEGATIVE NEGATIVE Final   Influenza B by PCR NEGATIVE NEGATIVE Final    Comment: (NOTE) The Xpert Xpress SARS-CoV-2/FLU/RSV plus assay is intended as an aid in the diagnosis of influenza from Nasopharyngeal swab specimens and should not  be used as a sole basis for treatment. Nasal washings and aspirates are unacceptable for Xpert Xpress SARS-CoV-2/FLU/RSV testing.  Fact Sheet for Patients: EntrepreneurPulse.com.au  Fact Sheet for Healthcare Providers: IncredibleEmployment.be  This test is not yet approved or cleared by the [place] FDA and has been authorized for detection and/or diagnosis of SARS-CoV-2 by FDA under an Emergency Use Authorization (EUA). This EUA will remain in effect (meaning this test can be used) for the duration of the COVID-19 declaration under Section 564(b)(1) of the Act, 21 U.S.C. section 360bbb-3(b)(1), unless the authorization is terminated or revoked.  Performed at [facility], [address], [place], [place] [address]   Urine Culture     Status: Abnormal   Collection Time: 07/29/22  6:23 PM   Specimen: In/Out Cath Urine  Result Value Ref Range Status   Specimen Description   Final    IN/OUT CATH URINE Performed at [facility], [address], [place], [place] [address]    Special Requests   Final    NONE Performed at [facility], [address], [place], [place] [address]    Culture MULTIPLE SPECIES PRESENT, SUGGEST RECOLLECTION (A)  Final   Report Status 07/31/2022 FINAL  Final  Culture, blood (Routine x 2)     Status: None (Preliminary result)   Collection Time: 07/29/22  7:08 PM   Specimen: BLOOD RIGHT WRIST  Result Value Ref Range Status   Specimen Description BLOOD RIGHT WRIST  Final   Special Requests   Final    BOTTLES DRAWN AEROBIC AND ANAEROBIC Blood Culture adequate volume   Culture   Final    NO GROWTH 2 DAYS Performed  at [facility], [address], [place], [place] [address]    Report Status PENDING  Incomplete  Culture, blood (Routine x 2)     Status: None (Preliminary result)   Collection Time: 07/29/22  7:08 PM   Specimen: Right Antecubital; Blood  Result Value Ref Range Status   Specimen Description RIGHT  ANTECUBITAL  Final   Special Requests   Final    BOTTLES DRAWN AEROBIC AND ANAEROBIC Blood Culture results may not be optimal due to an excessive volume of blood received in culture bottles   Culture   Final    NO GROWTH 2 DAYS Performed at [facility], [address] [place], [place] [address]    Report Status PENDING  Incomplete    Radiology Studies: No results found.  Scheduled Meds:  aspirin  81 mg Oral Daily   brimonidine  1 drop Both Eyes TID   feeding supplement (GLUCERNA SHAKE)  237 mL Oral TID BM   guaiFENesin  1,200 mg Oral BID   insulin aspart  0-9 Units Subcutaneous TID WC   latanoprost  1 drop Both Eyes QHS   multivitamin with minerals  1 tablet Oral Q supper   pantoprazole  40 mg Oral Daily   pravastatin  40 mg Oral QPM   timolol  1 drop Both Eyes QPM   Continuous Infusions:  ceFEPime (MAXIPIME) IV Stopped (08/01/22 0531)   vancomycin Stopped (07/31/22 2106)    LOS: 3 days   Time spent: 35 minutes  Etheleen Valtierra, MD [facility]  If 7PM-7AM, please contact night-coverage [email or web address] 08/01/2022, 1:40 PM

## 2022-08-02 DIAGNOSIS — E860 Dehydration: Secondary | ICD-10-CM | POA: Diagnosis not present

## 2022-08-02 DIAGNOSIS — N179 Acute kidney failure, unspecified: Secondary | ICD-10-CM | POA: Diagnosis not present

## 2022-08-02 DIAGNOSIS — A419 Sepsis, unspecified organism: Secondary | ICD-10-CM | POA: Diagnosis not present

## 2022-08-02 DIAGNOSIS — I5032 Chronic diastolic (congestive) heart failure: Secondary | ICD-10-CM | POA: Diagnosis not present

## 2022-08-02 LAB — GLUCOSE, CAPILLARY
Glucose-Capillary: 157 mg/dL — ABNORMAL HIGH (ref 70–99)
Glucose-Capillary: 136 mg/dL — ABNORMAL HIGH (ref 70–99)
Glucose-Capillary: 241 mg/dL — ABNORMAL HIGH (ref 70–99)
Glucose-Capillary: 206 mg/dL — ABNORMAL HIGH (ref 70–99)

## 2022-08-02 MED ORDER — SACCHAROMYCES BOULARDII 250 MG PO CAPS
250.0000 mg | ORAL_CAPSULE | Freq: Two times a day (BID) | ORAL | Status: DC
  Administered 2022-08-02 – 2022-08-04 (×5): 250 mg via ORAL
  Filled 2022-08-02 (×5): qty 1

## 2022-08-02 MED ORDER — DOXYCYCLINE HYCLATE 100 MG PO TABS
100.0000 mg | ORAL_TABLET | Freq: Two times a day (BID) | ORAL | Status: DC
  Administered 2022-08-03 – 2022-08-04 (×3): 100 mg via ORAL
  Filled 2022-08-02 (×3): qty 1

## 2022-08-02 MED ORDER — ALBUTEROL SULFATE (2.5 MG/3ML) 0.083% IN NEBU
INHALATION_SOLUTION | RESPIRATORY_TRACT | Status: AC
  Administered 2022-08-02: 2.5 mg
  Filled 2022-08-02: qty 3

## 2022-08-02 MED ORDER — VANCOMYCIN HCL IN DEXTROSE 1-5 GM/200ML-% IV SOLN
1000.0000 mg | INTRAVENOUS | Status: AC
  Administered 2022-08-02: 1000 mg via INTRAVENOUS
  Filled 2022-08-02: qty 200

## 2022-08-02 MED ORDER — ALBUTEROL SULFATE (2.5 MG/3ML) 0.083% IN NEBU
2.5000 mg | INHALATION_SOLUTION | Freq: Four times a day (QID) | RESPIRATORY_TRACT | Status: DC | PRN
Start: 2022-08-02 — End: 2022-08-04
  Administered 2022-08-03 – 2022-08-04 (×4): 2.5 mg via RESPIRATORY_TRACT
  Filled 2022-08-02 (×4): qty 3

## 2022-08-02 NOTE — Plan of Care (Signed)
  Problem: Acute Rehab PT Goals(only PT should resolve) Goal: Pt Will Go Supine/Side To Sit Outcome: Progressing Flowsheets (Taken 08/02/2022 1231) Pt will go Supine/Side to Sit:  with modified independence  with supervision Goal: Patient Will Transfer Sit To/From Stand Outcome: Progressing Flowsheets (Taken 08/02/2022 1231) Patient will transfer sit to/from stand:  with minimal assist  with min guard assist Goal: Pt Will Transfer Bed To Chair/Chair To Bed Outcome: Progressing Flowsheets (Taken 08/02/2022 1231) Pt will Transfer Bed to Chair/Chair to Bed: min guard assist Goal: Pt Will Ambulate Outcome: Progressing Flowsheets (Taken 08/02/2022 1231) Pt will Ambulate:  25 feet  with minimal assist  with rolling walker   12:32 PM, 08/02/22 Lonell Grandchild, MPT Physical Therapist with [facility] [phone] office [phone] mobile phone

## 2022-08-02 NOTE — NC FL2 (Deleted)
[place] LEVEL OF CARE SCREENING TOOL     IDENTIFICATION  Patient Name: Patrick Beck Birthdate: 05-14-1946 Sex: male Admission Date (Current Location): 07/29/2022  [facility] and [place] Number:  [employer] and Address:  [facility] [address] [place]      Provider Number: [id number]  Attending Physician Name and Address:  Murlean Iba, MD  Relative Name and Phone Number:       Current Level of Care: Hospital Recommended Level of Care: [place] Prior Approval Number:    Date Approved/Denied:   PASRR Number: [id number]  Discharge Plan: SNF    Current Diagnoses: Patient Active Problem List   Diagnosis Date Noted   Pressure injury of skin 07/31/2022   Left arm cellulitis 07/29/2022   Severe sepsis ([place]) 07/29/2022   Insomnia 07/29/2022   Chronic diastolic CHF (congestive heart failure) ([place]) 07/29/2022   Mixed hyperlipidemia 07/29/2022   AKI (acute kidney injury) ([place]) 07/29/2022   Dehydration 07/29/2022   Hyperkalemia 07/29/2022   Hypoalbuminemia due to protein-calorie malnutrition ([place]) 07/29/2022   Supratherapeutic INR 07/29/2022   Lactic acidosis 07/29/2022   Type 2 diabetes mellitus with hyperglycemia ([place]) 07/29/2022   GERD (gastroesophageal reflux disease) 07/29/2022   PAD (peripheral artery disease) ([place]) 06/26/2021   S/P TAVR (transcatheter aortic valve replacement) 06/26/2021   Retained dental root    Periodontal disease    Severe aortic stenosis    COPD (chronic obstructive pulmonary disease) ([place]) 10/22/2017   Tobacco dependence 10/22/2017   Type 2 diabetes mellitus without complication ([place]) 03/54/6568   Essential hypertension 10/22/2017   Glaucoma 10/22/2017    Orientation RESPIRATION BLADDER Height & Weight     Self, Time, Situation, Place  O2 (see dc summary) Continent Weight: 131 lb 6.4 oz (59.6 kg) Height:  '6\' 1"'$  (185.4 cm)  BEHAVIORAL SYMPTOMS/MOOD NEUROLOGICAL  BOWEL NUTRITION STATUS      Continent Diet (see dc summary)  AMBULATORY STATUS COMMUNICATION OF NEEDS Skin   Extensive Assist Verbally Normal                       Personal Care Assistance Level of Assistance  Bathing, Feeding, Dressing Bathing Assistance: Limited assistance Feeding assistance: Limited assistance       Functional Limitations Info  Sight, Hearing, Speech Sight Info: Adequate Hearing Info: Adequate Speech Info: Adequate    SPECIAL CARE FACTORS FREQUENCY  PT (By licensed PT), OT (By licensed OT)     PT Frequency: 5x week OT Frequency: 3x week            Contractures Contractures Info: Not present    Additional Factors Info  Code Status, Allergies Code Status Info: Full Allergies Info: Macadamia nut oil           Current Medications (08/02/2022):  This is the current hospital active medication list Current Facility-Administered Medications  Medication Dose Route Frequency Provider Last Rate Last Admin   acetaminophen (TYLENOL) tablet 650 mg  650 mg Oral Q6H PRN Adefeso, Oladapo, DO   650 mg at 07/31/22 2004   Or   acetaminophen (TYLENOL) suppository 650 mg  650 mg Rectal Q6H PRN Adefeso, Oladapo, DO       aspirin chewable tablet 81 mg  81 mg Oral Daily Manuella Ghazi, Pratik D, DO   81 mg at 08/02/22 0835   brimonidine (ALPHAGAN) 0.2 % ophthalmic solution 1 drop  1 drop Both Eyes TID Heath Lark D, DO  1 drop at 08/02/22 0836   ceFEPIme (MAXIPIME) 2 g in sodium chloride 0.9 % 100 mL IVPB  2 g Intravenous Q12H Shah, Pratik D, DO 200 mL/hr at 08/02/22 0603 2 g at 08/02/22 0603   feeding supplement (GLUCERNA SHAKE) (GLUCERNA SHAKE) liquid 237 mL  237 mL Oral TID BM Adefeso, Oladapo, DO   237 mL at 08/01/22 1944   guaiFENesin (MUCINEX) 12 hr tablet 1,200 mg  1,200 mg Oral BID Zierle-Ghosh, Asia B, DO   1,200 mg at 08/02/22 0835   insulin aspart (novoLOG) injection 0-9 Units  0-9 Units Subcutaneous TID WC Adefeso, Oladapo, DO   2 Units at 08/02/22 0827    latanoprost (XALATAN) 0.005 % ophthalmic solution 1 drop  1 drop Both Eyes QHS Manuella Ghazi, Pratik D, DO   1 drop at 08/01/22 2109   loperamide (IMODIUM) capsule 2 mg  2 mg Oral PRN Manuella Ghazi, Pratik D, DO   2 mg at 07/30/22 1940   melatonin tablet 21-30 mg  21-30 mg Oral QHS PRN Adefeso, Oladapo, DO   21 mg at 07/31/22 2003   multivitamin with minerals tablet 1 tablet  1 tablet Oral Q supper Heath Lark D, DO   1 tablet at 08/01/22 1746   pantoprazole (PROTONIX) EC tablet 40 mg  40 mg Oral Daily Adefeso, Oladapo, DO   40 mg at 08/02/22 0836   pravastatin (PRAVACHOL) tablet 40 mg  40 mg Oral QPM Adefeso, Oladapo, DO   40 mg at 08/01/22 1746   timolol (TIMOPTIC) 0.5 % ophthalmic solution 1 drop  1 drop Both Eyes QPM Shah, Pratik D, DO   1 drop at 08/01/22 1746   vancomycin (VANCOCIN) IVPB 1000 mg/200 mL premix  1,000 mg Intravenous Q24H Heath Lark D, DO   Stopped at 08/01/22 2036     Discharge Medications: Please see discharge summary for a list of discharge medications.  Relevant Imaging Results:  Relevant Lab Results:   Additional Information SSN: [id number], [place]

## 2022-08-02 NOTE — TOC Progression Note (Signed)
Transition of Care [facility]) - Progression Note    Patient Details  Name: Patrick Beck MRN: [id number] Date of Birth: 1946-12-04  Transition of Care [facility]) CM/SW Contact  Shade Flood, LCSW Phone Number: 08/02/2022, 3:55 PM  Clinical Narrative:     Spoke with pt and his wife at bedside to update on SNF bed offers. [facility] selected. Insurance auth request updated. [facility] is still pending. Updated Rachel Moulds at [facility]. They can accept pt tomorrow if [facility] Kaufmann is received.   Weekend TOC will follow up in AM.  Expected Discharge Plan: [place] Barriers to Discharge: SNF Pending bed offer, Insurance Authorization  Expected Discharge Plan and Services Expected Discharge Plan: [place] In-house Referral: Clinical Social Work   Post Acute Care Choice: [place] Living arrangements for the past 2 months: Single Family Home                                       Social Determinants of Health (SDOH) Interventions    Readmission Risk Interventions    08/02/2022   11:42 AM  Readmission Risk Prevention Plan  PCP or Specialist Appt within 5-7 Days Complete  Home Care Screening Complete  Medication Review (RN CM) Complete

## 2022-08-02 NOTE — TOC Initial Note (Signed)
Transition of Care [facility]) - Initial/Assessment Note    Patient Details  Name: Patrick Beck MRN: [id number] Date of Birth: 03/09/1946  Transition of Care [facility]) CM/SW Contact:    Shade Flood, LCSW Phone Number: 08/02/2022, 11:43 AM  Clinical Narrative:                  Per MD, pt medically stable for dc but pt stating he feels too weak to return directly home. PT consulted and recommend short term rehab. Spoke with pt to assess and review dc planning. Pt agreeable to SNF rehab. CMS provider options reviewed and will refer as requested.  Insurance authorization will be started as soon as PT note entered. If bed offer and auth received today, pt will dc today. Otherwise, he will dc when both obtained.  TOC will follow  Expected Discharge Plan: [place] Barriers to Discharge: SNF Pending bed offer, Insurance Authorization   Patient Goals and CMS Choice Patient states their goals for this hospitalization and ongoing recovery are:: get better CMS Medicare.gov Compare Post Acute Care list provided to:: Patient Choice offered to / list presented to : Patient  Expected Discharge Plan and Services Expected Discharge Plan: [place] In-house Referral: Clinical Social Work   Post Acute Care Choice: [place] Living arrangements for the past 2 months: [place]                                      Prior Living Arrangements/Services Living arrangements for the past 2 months: Single Family Home Lives with:: Self Patient language and need for interpreter reviewed:: Yes Do you feel safe going back to the place where you live?: Yes      Need for Family Participation in Patient Care: No (Comment)     Criminal Activity/Legal Involvement Pertinent to Current Situation/Hospitalization: No - Comment as needed  Activities of Daily Living Home Assistive Devices/Equipment: Cane (specify quad or straight) ADL Screening (condition at  time of admission) Patient's cognitive ability adequate to safely complete daily activities?: Yes Is the patient deaf or have difficulty hearing?: No Does the patient have difficulty seeing, even when wearing glasses/contacts?: No Does the patient have difficulty concentrating, remembering, or making decisions?: Yes Patient able to express need for assistance with ADLs?: Yes Does the patient have difficulty dressing or bathing?: Yes Independently performs ADLs?: Yes (appropriate for developmental age) Does the patient have difficulty walking or climbing stairs?: Yes Weakness of Legs: Both Weakness of Arms/Hands: Left  Permission Sought/Granted Permission sought to share information with : Facility Art therapist granted to share information with : Yes, Verbal Permission Granted     Permission granted to share info w AGENCY: snfs        Emotional Assessment Appearance:: Appears stated age Attitude/Demeanor/Rapport: Engaged Affect (typically observed): Pleasant Orientation: : Oriented to Self, Oriented to Place, Oriented to  Time, Oriented to Situation Alcohol / Substance Use: Not Applicable Psych Involvement: No (comment)  Admission diagnosis:  Left arm cellulitis [L03.114] Patient Active Problem List   Diagnosis Date Noted   Pressure injury of skin 07/31/2022   Left arm cellulitis 07/29/2022   Severe sepsis ([place]) 07/29/2022   Insomnia 07/29/2022   Chronic diastolic CHF (congestive heart failure) ([place]) 07/29/2022   Mixed hyperlipidemia 07/29/2022   AKI (acute kidney injury) ([place]) 07/29/2022   Dehydration 07/29/2022   Hyperkalemia 07/29/2022   Hypoalbuminemia due  to protein-calorie malnutrition ([place]) 07/29/2022   Supratherapeutic INR 07/29/2022   Lactic acidosis 07/29/2022   Type 2 diabetes mellitus with hyperglycemia ([place]) 07/29/2022   GERD (gastroesophageal reflux disease) 07/29/2022   PAD (peripheral artery disease) ([place]) 06/26/2021   S/P TAVR  (transcatheter aortic valve replacement) 06/26/2021   Retained dental root    Periodontal disease    Severe aortic stenosis    COPD (chronic obstructive pulmonary disease) ([place]) 10/22/2017   Tobacco dependence 10/22/2017   Type 2 diabetes mellitus without complication ([place]) 92/10/9416   Essential hypertension 10/22/2017   Glaucoma 10/22/2017   PCP:  Lemmie Evens, MD Pharmacy:  No Pharmacies Listed    Social Determinants of Health (SDOH) Interventions    Readmission Risk Interventions    08/02/2022   11:42 AM  Readmission Risk Prevention Plan  PCP or Specialist Appt within 5-7 Days Complete  Home Care Screening Complete  Medication Review (RN CM) Complete

## 2022-08-02 NOTE — Evaluation (Signed)
Physical Therapy Evaluation Patient Details Name: Patrick Beck MRN: [id number] DOB: 06/11/1946 Today's Date: 08/02/2022  History of Present Illness  Patrick Beck is a 76 y.o. male with medical history significant of CAD, hypertension, T2DM, GERD, severe aortic stenosis s/p TAVR who presents to the emergency department who presents to the emergency department due to 2-day onset of redness on the dorsum of left hand which has since spread to the left arm and this is associated with left arm swelling, he also complained of left foot swelling with fluid leaking out of the foot since last 3 days.  He decided to come to the emergency department for further evaluation and management.  He complained of left arm pain, but was not sure if he had any fever but denies chest pain, shortness of breath, headache, nausea, vomiting, abdominal pain.   Clinical Impression  Patient demonstrates labored movement for sitting up at bedside with [facility] raised, has difficulty completing sit to stands requiring Min/mod assist due to BLE weakness, limited to a few slow labored unsteady steps at bedside with flexed trunk before having to sit due to c/o fatigue and generalized weakness.  Patient tolerated sitting up in chair after therapy - RN aware.  Patient will benefit from continued skilled physical therapy in hospital and recommended venue below to increase strength, balance, endurance for safe ADLs and gait.          Recommendations for follow up therapy are one component of a multi-disciplinary discharge planning process, led by the attending physician.  Recommendations may be updated based on patient status, additional functional criteria and insurance authorization.  Follow Up Recommendations Skilled nursing-short term rehab (<3 hours/day) Can patient physically be transported by private vehicle: Yes    Assistance Recommended at Discharge Set up Supervision/Assistance  Patient can return home with the following  A lot  of help with walking and/or transfers;A lot of help with bathing/dressing/bathroom;Help with stairs or ramp for entrance;Assistance with cooking/housework    Equipment Recommendations None recommended by PT  Recommendations for Other Services       Functional Status Assessment Patient has had a recent decline in their functional status and demonstrates the ability to make significant improvements in function in a reasonable and predictable amount of time.     Precautions / Restrictions Precautions Precautions: Fall Restrictions Weight Bearing Restrictions: No      Mobility  Bed Mobility Overal bed mobility: Needs Assistance Bed Mobility: Supine to Sit     Supine to sit: Supervision, HOB elevated     General bed mobility comments: labored movement    Transfers Overall transfer level: Needs assistance Equipment used: Rolling walker (2 wheels) Transfers: Sit to/from Stand, Bed to chair/wheelchair/BSC Sit to Stand: Min assist, Mod assist   Step pivot transfers: Min assist, Mod assist       General transfer comment: difficulty completing sit to stands due to BLE weakness    Ambulation/Gait Ambulation/Gait assistance: Min assist, Mod assist Gait Distance (Feet): 6 Feet Assistive device: Rolling walker (2 wheels) Gait Pattern/deviations: Decreased step length - right, Decreased step length - left, Decreased stride length Gait velocity: decreased     General Gait Details: limited to a few steps at bedside due to c/o fatigue and generalized weakness  Stairs            Wheelchair Mobility    Modified Rankin (Stroke Patients Only)       Balance Overall balance assessment: Needs assistance Sitting-balance support: Feet supported, No upper  extremity supported Sitting balance-Leahy Scale: Fair Sitting balance - Comments: fair/good seated at EOB   Standing balance support: During functional activity, Bilateral upper extremity supported Standing balance-Leahy  Scale: Poor Standing balance comment: fair/poor using RW                             Pertinent Vitals/Pain Pain Assessment Pain Assessment: No/denies pain    Home Living Family/patient expects to be discharged to:: Private residence Living Arrangements: Spouse/significant other Available Help at Discharge: Family;Available 24 hours/day Type of Home: House Home Access: Stairs to enter Entrance Stairs-Rails: Right;Left (to wide to reach both) Entrance Stairs-Number of Steps: 3   Home Layout: One level Home Equipment: Conservation officer, nature (2 wheels);Cane - single point;Shower seat      Prior Function Prior Level of Function : Independent/Modified Independent             Mobility Comments: household and short distanced community ambulator using SPC, sometimes RW, does not drive ADLs Comments: assisted by family     Hand Dominance        Extremity/Trunk Assessment   Upper Extremity Assessment Upper Extremity Assessment: Generalized weakness    Lower Extremity Assessment Lower Extremity Assessment: Generalized weakness    Cervical / Trunk Assessment Cervical / Trunk Assessment: Kyphotic  Communication   Communication: No difficulties  Cognition Arousal/Alertness: Awake/alert Behavior During Therapy: WFL for tasks assessed/performed Overall Cognitive Status: Within Functional Limits for tasks assessed                                          General Comments      Exercises     Assessment/Plan    PT Assessment Patient needs continued PT services  PT Problem List Decreased strength;Decreased activity tolerance;Decreased balance;Decreased mobility       PT Treatment Interventions DME instruction;Gait training;Stair training;Functional mobility training;Therapeutic activities;Therapeutic exercise;Balance training;Patient/family education    PT Goals (Current goals can be found in the Care Plan section)  Acute Rehab PT Goals Patient  Stated Goal: return home after rehab PT Goal Formulation: With patient Time For Goal Achievement: 08/16/22 Potential to Achieve Goals: Good    Frequency Min 3X/week     Co-evaluation               AM-PAC PT "6 Clicks" Mobility  Outcome Measure Help needed turning from your back to your side while in a flat bed without using bedrails?: None Help needed moving from lying on your back to sitting on the side of a flat bed without using bedrails?: A Little Help needed moving to and from a bed to a chair (including a wheelchair)?: A Little Help needed standing up from a chair using your arms (e.g., wheelchair or bedside chair)?: A Lot Help needed to walk in hospital room?: A Lot Help needed climbing 3-5 steps with a railing? : A Lot 6 Click Score: 16    End of Session   Activity Tolerance: Patient tolerated treatment well;Patient limited by fatigue Patient left: in chair;with call bell/phone within reach Nurse Communication: Mobility status PT Visit Diagnosis: Unsteadiness on feet (R26.81);Other abnormalities of gait and mobility (R26.89);Muscle weakness (generalized) (M62.81)    Time: 1101-1130 PT Time Calculation (min) (ACUTE ONLY): 29 min   Charges:   PT Evaluation $PT Eval Moderate Complexity: 1 Mod PT Treatments $Therapeutic Activity: 23-37 mins  12:30 PM, 08/02/22 Lonell Grandchild, MPT Physical Therapist with [facility] 336 [phone] office [phone] mobile phone

## 2022-08-02 NOTE — NC FL2 (Signed)
[place] LEVEL OF CARE SCREENING TOOL     IDENTIFICATION  Patient Name: Patrick Beck Birthdate: 1946/04/23 Sex: male Admission Date (Current Location): 07/29/2022  [facility] and [place] Number:  [employer] and Address:  [place] [address], [place]      Provider Number: [id number]  Attending Physician Name and Address:  Murlean Iba, MD  Relative Name and Phone Number:       Current Level of Care: Hospital Recommended Level of Care: [place] Prior Approval Number:    Date Approved/Denied:   PASRR Number: [id number]  Discharge Plan: SNF    Current Diagnoses: Patient Active Problem List   Diagnosis Date Noted   Pressure injury of skin 07/31/2022   Left arm cellulitis 07/29/2022   Severe sepsis ([place]) 07/29/2022   Insomnia 07/29/2022   Chronic diastolic CHF (congestive heart failure) ([place]) 07/29/2022   Mixed hyperlipidemia 07/29/2022   AKI (acute kidney injury) ([place]) 07/29/2022   Dehydration 07/29/2022   Hyperkalemia 07/29/2022   Hypoalbuminemia due to protein-calorie malnutrition ([place]) 07/29/2022   Supratherapeutic INR 07/29/2022   Lactic acidosis 07/29/2022   Type 2 diabetes mellitus with hyperglycemia ([place]) 07/29/2022   GERD (gastroesophageal reflux disease) 07/29/2022   PAD (peripheral artery disease) ([place]) 06/26/2021   S/P TAVR (transcatheter aortic valve replacement) 06/26/2021   Retained dental root    Periodontal disease    Severe aortic stenosis    COPD (chronic obstructive pulmonary disease) ([place]) 10/22/2017   Tobacco dependence 10/22/2017   Type 2 diabetes mellitus without complication ([place]) 67/11/4579   Essential hypertension 10/22/2017   Glaucoma 10/22/2017    Orientation RESPIRATION BLADDER Height & Weight     Self, Time, Situation, Place  O2 (see dc summary) Continent Weight: 131 lb 6.4 oz (59.6 kg) Height:  '6\' 1"'$  (185.4 cm)  BEHAVIORAL SYMPTOMS/MOOD NEUROLOGICAL  BOWEL NUTRITION STATUS      Continent Diet (see dc summary)  AMBULATORY STATUS COMMUNICATION OF NEEDS Skin   Extensive Assist Verbally PU Stage and Appropriate Care ([place])   PU Stage 2 Dressing: Daily                   Personal Care Assistance Level of Assistance  Bathing, Feeding, Dressing Bathing Assistance: Limited assistance Feeding assistance: Limited assistance       Functional Limitations Info  Sight, Hearing, Speech Sight Info: Adequate Hearing Info: Adequate Speech Info: Adequate    SPECIAL CARE FACTORS FREQUENCY  PT (By licensed PT), OT (By licensed OT)     PT Frequency: 5x week OT Frequency: 3x week            Contractures Contractures Info: Not present    Additional Factors Info  Code Status, Allergies Code Status Info: Full Allergies Info: Macadamia nut oil           Current Medications (08/02/2022):  This is the current hospital active medication list Current Facility-Administered Medications  Medication Dose Route Frequency Provider Last Rate Last Admin   acetaminophen (TYLENOL) tablet 650 mg  650 mg Oral Q6H PRN Adefeso, Oladapo, DO   650 mg at 07/31/22 2004   Or   acetaminophen (TYLENOL) suppository 650 mg  650 mg Rectal Q6H PRN Adefeso, Oladapo, DO       aspirin chewable tablet 81 mg  81 mg Oral Daily Manuella Ghazi, Pratik D, DO   81 mg at 08/02/22 0835   brimonidine (ALPHAGAN) 0.2 % ophthalmic solution 1 drop  1 drop  Both Eyes TID Manuella Ghazi, Pratik D, DO   1 drop at 08/02/22 0836   ceFEPIme (MAXIPIME) 2 g in sodium chloride 0.9 % 100 mL IVPB  2 g Intravenous Q12H Shah, Pratik D, DO 200 mL/hr at 08/02/22 0603 2 g at 08/02/22 0603   feeding supplement (GLUCERNA SHAKE) (GLUCERNA SHAKE) liquid 237 mL  237 mL Oral TID BM Adefeso, Oladapo, DO   237 mL at 08/01/22 1944   guaiFENesin (MUCINEX) 12 hr tablet 1,200 mg  1,200 mg Oral BID Zierle-Ghosh, Asia B, DO   1,200 mg at 08/02/22 0835   insulin aspart (novoLOG) injection 0-9 Units  0-9 Units Subcutaneous TID WC  Adefeso, Oladapo, DO   1 Units at 08/02/22 1205   latanoprost (XALATAN) 0.005 % ophthalmic solution 1 drop  1 drop Both Eyes QHS Manuella Ghazi, Pratik D, DO   1 drop at 08/01/22 2109   loperamide (IMODIUM) capsule 2 mg  2 mg Oral PRN Manuella Ghazi, Pratik D, DO   2 mg at 07/30/22 1940   melatonin tablet 21-30 mg  21-30 mg Oral QHS PRN Adefeso, Oladapo, DO   21 mg at 07/31/22 2003   multivitamin with minerals tablet 1 tablet  1 tablet Oral Q supper Heath Lark D, DO   1 tablet at 08/01/22 1746   pantoprazole (PROTONIX) EC tablet 40 mg  40 mg Oral Daily Adefeso, Oladapo, DO   40 mg at 08/02/22 0836   pravastatin (PRAVACHOL) tablet 40 mg  40 mg Oral QPM Adefeso, Oladapo, DO   40 mg at 08/01/22 1746   timolol (TIMOPTIC) 0.5 % ophthalmic solution 1 drop  1 drop Both Eyes QPM Shah, Pratik D, DO   1 drop at 08/01/22 1746   vancomycin (VANCOCIN) IVPB 1000 mg/200 mL premix  1,000 mg Intravenous Q24H Heath Lark D, DO   Stopped at 08/01/22 2036     Discharge Medications: Please see discharge summary for a list of discharge medications.  Relevant Imaging Results:  Relevant Lab Results:   Additional Information SSN: [id number], [place]

## 2022-08-02 NOTE — Progress Notes (Signed)
PROGRESS NOTE    Patrick Beck  ENI:[id number] DOB: 1946/04/04 DOA: 07/29/2022 PCP: Lemmie Evens, MD  Barrier to discharge: SNF placement   Brief Narrative:    Patrick Beck is a 76 y.o. male with medical history significant of CAD, hypertension, T2DM, GERD, severe aortic stenosis s/p TAVR who presents to the emergency department who presents to the emergency department due to 2-day onset of redness on the dorsum of left hand which has since spread to the left arm and this is associated with left arm swelling, he also complained of left foot swelling with fluid leaking out of the foot since last 3 days.  Patient was admitted for severe sepsis, present on admission secondary to cellulitis to his left arm.  Assessment & Plan:   Principal Problem:   Severe sepsis ([place]) Active Problems:   Essential hypertension   Left arm cellulitis   Insomnia   Chronic diastolic CHF (congestive heart failure) (HCC)   Mixed hyperlipidemia   AKI (acute kidney injury) ([place])   Dehydration   Hyperkalemia   Hypoalbuminemia due to protein-calorie malnutrition (HCC)   Supratherapeutic INR   Lactic acidosis   Type 2 diabetes mellitus with hyperglycemia (HCC)   GERD (gastroesophageal reflux disease)   Pressure injury of skin  Assessment and Plan:  Severe sepsis, present on admission, secondary to left arm cellulitis Left foot wound; wound consulted and wound care instructions being implemented Patient was tachycardic and tachypneic (met SIRS criteria), source of infection was suspected to be cellulitis of left hand (met sepsis criteria).  Lactic acid was 4.9 > 5.8 (met severe sepsis criteria) Sepsis physiology has resolved now.  Patient was started on IV cefepime, Flagyl and vancomycin IV hydration per sepsis protocol provided.  Continue IV hydration Continue wound care Continue Tylenol as needed Blood culture with no growth noted thus far, continue to monitor Continue to trend lactic acid in  am  Cellulitis of left arm  Improving on IV antibiotics but not yet under control as it remains red, hot but edema is improving, transition to oral antibiotics and likely discharge to SNF when bed available.     Supratherapeutic INR (cause unknown) INR 7.4 on admission, now improving down to 1.5 Patient was not on warfarin Vitamin K 2.5 mg p.o. x1 was given Continue to monitor INR DVT prophylaxis with SCDs for now Check ultrasound of right upper quadrant - no significant liver abnormalities noted.   Hypoalbuminemia possibly secondary to moderate protein calorie malnutrition Albumin 2.4, protein supplement to be provided  Type 2 diabetes mellitus with hyperglycemia Continue ISS and hypoglycemia protocol Glimepiride and metformin will be held at this time CBG (last 3)  Recent Labs    08/01/22 2124 08/02/22 0729 08/02/22 1147  GLUCAP 314* 157* 136*   Dehydration Acute kidney injury BUN/creatinine 30/1.27 (baseline creatinine at 0.7-1.1) IV hydration was provided Renally adjust medications, avoid nephrotoxic agents/dehydration/hypotension   Insomnia Continue melatonin  GERD Continue Protonix  Mixed hyperlipidemia Continue pravastatin   Chronic diastolic CHF Echocardiogram done on 05/08/2022 showed LVEF of 60 to 65% no RWMA.  Mild concentric LVH.  G1 DD Continue total input/output, daily weights and fluid restriction Continue Cardiac diet             Home Lasix and spironolactone on hold due to soft BP and dehydration   Essential hypertension BP meds will be held at this time due to soft BP    DVT prophylaxis: SCDs Code Status: Full Family Communication: wife updated 08/02/22 and  agreeable to SNF  Disposition Plan: SNF rehab Status is: Inpatient Remains inpatient appropriate because: Need for IV medications.   Skin Assessment:  I have examined the patient's skin and I agree with the wound assessment as performed by the wound care RN as outlined below:  Pressure  Injury 07/29/22 Sacrum Stage 2 -  Partial thickness loss of dermis presenting as a shallow open injury with a red, pink wound bed without slough. stage 2 to sacrum measuring 2 cm x 1 cm (Active)  07/29/22 2300  Location: Sacrum  Location Orientation:   Staging: Stage 2 -  Partial thickness loss of dermis presenting as a shallow open injury with a red, pink wound bed without slough.  Wound Description (Comments): stage 2 to sacrum measuring 2 cm x 1 cm  Present on Admission: Yes  Dressing Type Foam - Lift dressing to assess site every shift 08/01/22 0800     Pressure Injury 07/29/22 Stage 2 -  Partial thickness loss of dermis presenting as a shallow open injury with a red, pink wound bed without slough. stage 2 to sacrum measuring 1 cm x 1 cm (Active)  07/29/22 2300  Location:   Location Orientation:   Staging: Stage 2 -  Partial thickness loss of dermis presenting as a shallow open injury with a red, pink wound bed without slough.  Wound Description (Comments): stage 2 to sacrum measuring 1 cm x 1 cm  Present on Admission: Yes  Dressing Type Foam - Lift dressing to assess site every shift 08/01/22 0800    Consultants:  None  Procedures:  None  Antimicrobials:  Anti-infectives (From admission, onward)    Start     Dose/Rate Route Frequency Ordered Stop   08/03/22 1000  doxycycline (VIBRA-TABS) tablet 100 mg        100 mg Oral Every 12 hours 08/02/22 1426 08/08/22 0959   07/30/22 2000  vancomycin (VANCOREADY) IVPB 750 mg/150 mL  Status:  Discontinued        750 mg 150 mL/hr over 60 Minutes Intravenous Every 24 hours 07/29/22 1924 07/30/22 0918   07/30/22 2000  vancomycin (VANCOCIN) IVPB 1000 mg/200 mL premix        1,000 mg 200 mL/hr over 60 Minutes Intravenous Every 24 hours 07/30/22 0918 08/03/22 1959   07/30/22 1800  ceFEPIme (MAXIPIME) 2 g in sodium chloride 0.9 % 100 mL IVPB        2 g 200 mL/hr over 30 Minutes Intravenous Every 12 hours 07/30/22 0908 08/03/22 0559    07/30/22 0630  ceFEPIme (MAXIPIME) 1 g in sodium chloride 0.9 % 100 mL IVPB  Status:  Discontinued        1 g 200 mL/hr over 30 Minutes Intravenous Every 12 hours 07/29/22 1844 07/30/22 0908   07/29/22 1900  vancomycin (VANCOREADY) IVPB 1250 mg/250 mL        1,250 mg 166.7 mL/hr over 90 Minutes Intravenous  Once 07/29/22 1836 07/29/22 2216   07/29/22 1830  ceFEPIme (MAXIPIME) 2 g in sodium chloride 0.9 % 100 mL IVPB        2 g 200 mL/hr over 30 Minutes Intravenous  Once 07/29/22 1823 07/29/22 1902   07/29/22 1830  metroNIDAZOLE (FLAGYL) IVPB 500 mg        500 mg 100 mL/hr over 60 Minutes Intravenous  Once 07/29/22 1823 07/29/22 2024   07/29/22 1830  vancomycin (VANCOCIN) IVPB 1000 mg/200 mL premix  Status:  Discontinued        1,000  mg 200 mL/hr over 60 Minutes Intravenous  Once 07/29/22 1823 07/29/22 1837      Subjective: Patient reports he feels too weak to go home, due to deconditioning and due to poor living condition at home, wife cannot lift him due to her back problems.   Objective: Vitals:   08/01/22 2121 08/02/22 0532 08/02/22 1135 08/02/22 1441  BP: (!) 114/59 (!) 99/58  111/71  Pulse: 63 63  98  Resp: 20 20  17   Temp: 97.6 F (36.4 C) 97.9 F (36.6 C)  98.1 F (36.7 C)  TempSrc:  Oral  Oral  SpO2: 98% 99%  100%  Weight:   59.6 kg   Height:        Intake/Output Summary (Last 24 hours) at 08/02/2022 1518 Last data filed at 08/02/2022 1453 Gross per 24 hour  Intake 1120 ml  Output 400 ml  Net 720 ml   Filed Weights   07/31/22 0500 08/01/22 0427 08/02/22 1135  Weight: 63.1 kg 59.1 kg 59.6 kg    Examination:  General exam: Appears calm and comfortable, NAD.  Respiratory system: Clear to auscultation. Respiratory effort normal. Cardiovascular system: S1 & S2 heard, RRR.  Gastrointestinal system: Abdomen is soft Central nervous system: Alert and awake Extremities: Left arm erythema markedly improved.  Left foot wounds with dressings clean dry and intact Skin:  No significant lesions noted Psychiatry: normal mood.  Data Reviewed: I have personally reviewed following labs and imaging studies  CBC: Recent Labs  Lab 07/29/22 1806 07/30/22 0410 07/31/22 0604 08/01/22 0456  WBC 6.4 8.5 8.2 7.6  NEUTROABS 4.9  --   --   --   HGB 13.4 11.6* 10.8* 12.0*  HCT 41.7 35.8* 33.6* 37.8*  MCV 98.8 98.6 97.7 102.2*  PLT 162 140* 133* 294   Basic Metabolic Panel: Recent Labs  Lab 07/29/22 1806 07/30/22 0410 07/31/22 0604 08/01/22 0456  NA 136 136 136 134*  K 5.3* 4.5 4.2 4.4  CL 103 105 106 104  CO2 22 22 27 25   GLUCOSE 148* 187* 69* 177*  BUN 30* 31* 28* 29*  CREATININE 1.27* 1.32* 1.04 1.14  CALCIUM 7.7* 7.4* 7.6* 7.6*  MG  --  1.1* 1.5* 2.5*  PHOS  --  2.4*  --   --    GFR: Estimated Creatinine Clearance: 47.9 mL/min (by C-G formula based on SCr of 1.14 mg/dL). Liver Function Tests: Recent Labs  Lab 07/29/22 1806  AST 22  ALT 17  ALKPHOS 91  BILITOT 0.9  PROT 5.1*  ALBUMIN 2.4*   No results for input(s): "LIPASE", "AMYLASE" in the last 168 hours. No results for input(s): "AMMONIA" in the last 168 hours. Coagulation Profile: Recent Labs  Lab 07/29/22 1908 07/30/22 0410 07/31/22 0604 08/01/22 0456  INR 7.4* 1.3* 1.6* 1.2   Cardiac Enzymes: No results for input(s): "CKTOTAL", "CKMB", "CKMBINDEX", "TROPONINI" in the last 168 hours. BNP (last 3 results) No results for input(s): "PROBNP" in the last 8760 hours. HbA1C: No results for input(s): "HGBA1C" in the last 72 hours.  CBG: Recent Labs  Lab 08/01/22 1109 08/01/22 1637 08/01/22 2124 08/02/22 0729 08/02/22 1147  GLUCAP 172* 201* 314* 157* 136*   Lipid Profile: No results for input(s): "CHOL", "HDL", "LDLCALC", "TRIG", "CHOLHDL", "LDLDIRECT" in the last 72 hours. Thyroid Function Tests: No results for input(s): "TSH", "T4TOTAL", "FREET4", "T3FREE", "THYROIDAB" in the last 72 hours. Anemia Panel: No results for input(s): "VITAMINB12", "FOLATE", "FERRITIN",  "TIBC", "IRON", "RETICCTPCT" in the last 72 hours. Sepsis  Labs: Recent Labs  Lab 07/29/22 2039 07/29/22 2344 07/30/22 0410 07/31/22 0604 08/01/22 0456  PROCALCITON  --   --   --   --  0.51  LATICACIDVEN 5.8* 4.9* 4.2* 0.9  --     Recent Results (from the past 240 hour(s))  Resp Panel by RT-PCR (Flu A&B, Covid) Anterior Nasal Swab     Status: None   Collection Time: 07/29/22  6:23 PM   Specimen: Anterior Nasal Swab  Result Value Ref Range Status   SARS Coronavirus 2 by RT PCR NEGATIVE NEGATIVE Final    Comment: (NOTE) SARS-CoV-2 target nucleic acids are NOT DETECTED.  The SARS-CoV-2 RNA is generally detectable in upper respiratory specimens during the acute phase of infection. The lowest concentration of SARS-CoV-2 viral copies this assay can detect is 138 copies/mL. A negative result does not preclude SARS-Cov-2 infection and should not be used as the sole basis for treatment or other patient management decisions. A negative result may occur with  improper specimen collection/handling, submission of specimen other than nasopharyngeal swab, presence of viral mutation(s) within the areas targeted by this assay, and inadequate number of viral copies(<138 copies/mL). A negative result must be combined with clinical observations, patient history, and epidemiological information. The expected result is Negative.  Fact Sheet for Patients:  EntrepreneurPulse.com.au  Fact Sheet for Healthcare Providers:  IncredibleEmployment.be  This test is no t yet approved or cleared by the [place] FDA and  has been authorized for detection and/or diagnosis of SARS-CoV-2 by FDA under an Emergency Use Authorization (EUA). This EUA will remain  in effect (meaning this test can be used) for the duration of the COVID-19 declaration under Section 564(b)(1) of the Act, 21 U.S.C.section 360bbb-3(b)(1), unless the authorization is terminated  or revoked  sooner.       Influenza A by PCR NEGATIVE NEGATIVE Final   Influenza B by PCR NEGATIVE NEGATIVE Final    Comment: (NOTE) The Xpert Xpress SARS-CoV-2/FLU/RSV plus assay is intended as an aid in the diagnosis of influenza from Nasopharyngeal swab specimens and should not be used as a sole basis for treatment. Nasal washings and aspirates are unacceptable for Xpert Xpress SARS-CoV-2/FLU/RSV testing.  Fact Sheet for Patients: EntrepreneurPulse.com.au  Fact Sheet for Healthcare Providers: IncredibleEmployment.be  This test is not yet approved or cleared by the [place] FDA and has been authorized for detection and/or diagnosis of SARS-CoV-2 by FDA under an Emergency Use Authorization (EUA). This EUA will remain in effect (meaning this test can be used) for the duration of the COVID-19 declaration under Section 564(b)(1) of the Act, 21 U.S.C. section 360bbb-3(b)(1), unless the authorization is terminated or revoked.  Performed at [facility], [address] [place], [place] [address]   Urine Culture     Status: Abnormal   Collection Time: 07/29/22  6:23 PM   Specimen: In/Out Cath Urine  Result Value Ref Range Status   Specimen Description   Final    IN/OUT CATH URINE Performed at [facility], [address], [place], [place] [address]    Special Requests   Final    NONE Performed at [facility], [address], [place], [place] [address]    Culture MULTIPLE SPECIES PRESENT, SUGGEST RECOLLECTION (A)  Final   Report Status 07/31/2022 FINAL  Final  Culture, blood (Routine x 2)     Status: None (Preliminary result)   Collection Time: 07/29/22  7:08 PM   Specimen: BLOOD RIGHT WRIST  Result Value Ref Range Status  Specimen Description BLOOD RIGHT WRIST  Final   Special Requests   Final    BOTTLES DRAWN AEROBIC AND ANAEROBIC Blood Culture adequate volume   Culture   Final    NO GROWTH 4 DAYS Performed at [facility], [address], [place], [place] [address]    Report Status PENDING  Incomplete  Culture, blood (Routine x 2)     Status: None (Preliminary result)   Collection Time: 07/29/22  7:08 PM   Specimen: Right Antecubital; Blood  Result Value Ref Range Status   Specimen Description RIGHT ANTECUBITAL  Final   Special Requests   Final    BOTTLES DRAWN AEROBIC AND ANAEROBIC Blood Culture results may not be optimal due to an excessive volume of blood received in culture bottles   Culture   Final    NO GROWTH 4 DAYS Performed at [facility], [address] [place], [place] [address]    Report Status PENDING  Incomplete  MRSA Next Gen by PCR, Nasal     Status: None   Collection Time: 08/01/22  2:04 PM   Specimen: Nasal Mucosa; Nasal Swab  Result Value Ref Range Status   MRSA by PCR Next Gen NOT DETECTED NOT DETECTED Final    Comment: (NOTE) The GeneXpert MRSA Assay (FDA approved for NASAL specimens only), is one component of a comprehensive MRSA colonization surveillance program. It is not intended to diagnose MRSA infection nor to guide or monitor treatment for MRSA infections. Test performance is not FDA approved in patients less than 30 years old. Performed at [facility], [address], [place], [place] [address]     Radiology Studies: No results found.  Scheduled Meds:  aspirin  81 mg Oral Daily   brimonidine  1 drop Both Eyes TID   [START ON 08/03/2022] doxycycline  100 mg Oral Q12H   feeding supplement (GLUCERNA SHAKE)  237 mL Oral TID BM   guaiFENesin  1,200 mg Oral BID   insulin aspart  0-9 Units Subcutaneous TID WC   latanoprost  1 drop Both Eyes QHS   multivitamin with minerals  1 tablet Oral Q supper   pantoprazole  40 mg Oral Daily   pravastatin  40 mg Oral QPM   saccharomyces boulardii  250 mg Oral BID   timolol  1 drop Both Eyes QPM   Continuous Infusions:  ceFEPime (MAXIPIME) IV 2 g (08/02/22 0603)   vancomycin Stopped (08/01/22 2036)    LOS: 4 days   Time spent: 35  minutes  Nadine Ryle, MD [facility]  If 7PM-7AM, please contact night-coverage [email or web address] 08/02/2022, 3:18 PM

## 2022-08-02 NOTE — Inpatient Diabetes Management (Signed)
Inpatient Diabetes Program Recommendations  AACE/ADA: New Consensus Statement on Inpatient Glycemic Control (2015)  Target Ranges:  Prepandial:   less than 140 mg/dL      Peak postprandial:   less than 180 mg/dL (1-2 hours)      Critically ill patients:  140 - 180 mg/dL   Lab Results  Component Value Date   GLUCAP 157 (H) 08/02/2022   HGBA1C 6.6 (H) 07/29/2022    Review of Glycemic Control  Latest Reference Range & Units 08/01/22 07:20 08/01/22 11:09 08/01/22 16:37 08/01/22 21:24 08/02/22 07:29  Glucose-Capillary 70 - 99 mg/dL 150 (H) 172 (H) 201 (H) 314 (H) 157 (H)   Diabetes history: DM 2 Outpatient Diabetes medications: Amaryl 0.5 mg Daily, Metformin 1000 mg bid Current orders for Inpatient glycemic control:  Novolog 0-9 units tid  Glucerna tid between meals  Inpatient Diabetes Program Recommendations:    -  Consider Novolog 2 units tid meal coverage if eating >50% of meals  Thanks,  Tama Headings RN, MSN, BC-ADM Inpatient Diabetes Coordinator Team Pager [phone] (8a-5p)

## 2022-08-03 DIAGNOSIS — I5032 Chronic diastolic (congestive) heart failure: Secondary | ICD-10-CM | POA: Diagnosis not present

## 2022-08-03 DIAGNOSIS — N179 Acute kidney failure, unspecified: Secondary | ICD-10-CM | POA: Diagnosis not present

## 2022-08-03 DIAGNOSIS — A419 Sepsis, unspecified organism: Secondary | ICD-10-CM | POA: Diagnosis not present

## 2022-08-03 DIAGNOSIS — E860 Dehydration: Secondary | ICD-10-CM | POA: Diagnosis not present

## 2022-08-03 LAB — CULTURE, BLOOD (ROUTINE X 2)
Culture: NO GROWTH
Culture: NO GROWTH
Special Requests: ADEQUATE

## 2022-08-03 LAB — GLUCOSE, CAPILLARY
Glucose-Capillary: 155 mg/dL — ABNORMAL HIGH (ref 70–99)
Glucose-Capillary: 134 mg/dL — ABNORMAL HIGH (ref 70–99)
Glucose-Capillary: 91 mg/dL (ref 70–99)
Glucose-Capillary: 238 mg/dL — ABNORMAL HIGH (ref 70–99)

## 2022-08-03 MED ORDER — ACETAMINOPHEN 325 MG PO TABS: 650.0000 mg | ORAL_TABLET | Freq: Four times a day (QID) | ORAL | Status: AC | PRN

## 2022-08-03 MED ORDER — INSULIN ASPART 100 UNIT/ML IJ SOLN
4.0000 [IU] | Freq: Three times a day (TID) | INTRAMUSCULAR | Status: DC
  Administered 2022-08-03 – 2022-08-04 (×3): 4 [IU] via SUBCUTANEOUS

## 2022-08-03 MED ORDER — DOXYCYCLINE HYCLATE 100 MG PO TABS: 100.0000 mg | ORAL_TABLET | Freq: Two times a day (BID) | ORAL | 0 refills | Status: AC

## 2022-08-03 MED ORDER — LOPERAMIDE HCL 2 MG PO CAPS: 2.0000 mg | ORAL_CAPSULE | ORAL | 0 refills | Status: AC | PRN

## 2022-08-03 MED ORDER — IBUPROFEN 200 MG PO TABS: 200.0000 mg | ORAL_TABLET | Freq: Three times a day (TID) | ORAL | 0 refills | Status: DC | PRN

## 2022-08-03 MED ORDER — SACCHAROMYCES BOULARDII 250 MG PO CAPS: 250.0000 mg | ORAL_CAPSULE | Freq: Two times a day (BID) | ORAL | 0 refills | Status: DC

## 2022-08-03 NOTE — Progress Notes (Signed)
Pt is very weak. He asked to get up to [facility] and was not able to stand on his own. It took 3 staff members to help get patient from bed to [facility] and pt did not stand up straight. Pt is not able to tolerated standing on his own therefore we could not weigh him on the standing scale this am.

## 2022-08-03 NOTE — Discharge Summary (Signed)
Physician Discharge Summary  Patrick Beck OXB:[id number] DOB: December 12, 1946 DOA: 07/29/2022  PCP: Lemmie Evens, MD  Admit date: 07/29/2022 Discharge date: 08/03/2022  Admitted From:  Home  Disposition:  SNF   Recommendations for Outpatient Follow-up:  Follow up with PCP in 2 weeks Follow up with cardiology in 3 weeks Please obtain BMP in 2-3 weeks to follow up renal function, electrolytes Please continue wound care and recommend air mattress and heel protectors Please check blood glucose at least 2-3 times per day  Please monitor weights and blood pressures  Discharge Condition: STABLE   CODE STATUS: FULL  DIET: heart healthy/carb modified   Brief Hospitalization Summary: Please see all hospital notes, images, labs for full details of the hospitalization. Brief Narrative:    Patrick Beck is a 76 y.o. male with medical history significant of CAD, hypertension, T2DM, GERD, severe aortic stenosis s/p TAVR who presents to the emergency department who presents to the emergency department due to 2-day onset of redness on the dorsum of left hand which has since spread to the left arm and this is associated with left arm swelling, he also complained of left foot swelling with fluid leaking out of the foot since last 3 days.  Patient was admitted for severe sepsis, present on admission secondary to cellulitis to his left arm.   Assessment & Plan:   Principal Problem:   Severe sepsis ([place]) Active Problems:   Essential hypertension   Left arm cellulitis   Insomnia   Chronic diastolic CHF (congestive heart failure) (HCC)   Mixed hyperlipidemia   AKI (acute kidney injury)    Dehydration   Hyperkalemia   Hypoalbuminemia due to protein-calorie malnutrition (HCC)   Supratherapeutic INR   Lactic acidosis   Type 2 diabetes mellitus with hyperglycemia (HCC)   GERD (gastroesophageal reflux disease)   Pressure injury of skin   Assessment and Plan:   Severe sepsis, present on admission,  secondary to left arm cellulitis Left foot wound; wound consulted and wound care instructions being implemented Patient was tachycardic and tachypneic (met SIRS criteria), source of infection was suspected to be cellulitis of left hand (met sepsis criteria).  Lactic acid was 4.9 > 5.8 (met severe sepsis criteria) Sepsis physiology has resolved now.  Patient was started on IV cefepime, Flagyl and vancomycin IV hydration per sepsis protocol provided.  Continue wound care Continue Tylenol as needed Blood culture with no growth noted    Cellulitis of left arm  - treated and resolving now Improved on IV antibiotics and now transitioned to oral antibiotics to complete course     Supratherapeutic INR (cause unknown) INR 7.4 on admission, now improving down to 1.5 Patient was not on warfarin Vitamin K 2.5 mg p.o. x1 was given Continue to monitor INR DVT prophylaxis with SCDs for now Check ultrasound of right upper quadrant - no significant liver abnormalities noted.   Hypoalbuminemia possibly secondary to moderate protein calorie malnutrition Albumin 2.4, protein supplement to be provided  Type 2 diabetes mellitus with hyperglycemia Continue ISS and hypoglycemia protocol used in hospital Glimepiride and metformin can be resumed at discharge CBG (last 3)  Recent Labs    08/02/22 1630 08/02/22 2117 08/03/22 0814  GLUCAP 241* 206* 155*    Dehydration - TREATED  Acute kidney injury - RESOLVED  BUN/creatinine 30/1.27 (baseline creatinine at 0.7-1.1) IV hydration was provided and resolved now   Insomnia Continue home dose melatonin  GERD Continue Protonix  Mixed hyperlipidemia Continue pravastatin   Chronic diastolic  CHF Echocardiogram done on 05/08/2022 showed LVEF of 60 to 65% no RWMA.  Mild concentric LVH.  G1 DD Continue total input/output, daily weights and fluid restriction Continue Cardiac diet             Resuming home Lasix and spironolactone   Essential  hypertension Stable, controlled.      Discharge Diagnoses:  Principal Problem:   Severe sepsis ([name]) Active Problems:   Essential hypertension   Left arm cellulitis   Insomnia   Chronic diastolic CHF (congestive heart failure) (HCC)   Mixed hyperlipidemia   AKI (acute kidney injury) ([name])   Dehydration   Hyperkalemia   Hypoalbuminemia due to protein-calorie malnutrition (HCC)   Supratherapeutic INR   Lactic acidosis   Type 2 diabetes mellitus with hyperglycemia (HCC)   GERD (gastroesophageal reflux disease)   Pressure injury of skin   Discharge Instructions:  Allergies as of 08/03/2022       Reactions   Macadamia Nut Oil Anaphylaxis   Throat swelling and can't breathe        Medication List     STOP taking these medications    diphenhydrAMINE 25 MG tablet Commonly known as: BENADRYL   hydrochlorothiazide 25 MG tablet Commonly known as: HYDRODIURIL   HYDROcodone-acetaminophen 7.5-325 MG tablet Commonly known as: NORCO   hydrocortisone cream 1 %       TAKE these medications    acetaminophen 325 MG tablet Commonly known as: TYLENOL Take 2 tablets (650 mg total) by mouth every 6 (six) hours as needed for mild pain (or Fever >/= 101).   aspirin 81 MG chewable tablet Chew 1 tablet (81 mg total) by mouth daily. Can swallow whole   brimonidine 0.2 % ophthalmic solution Commonly known as: ALPHAGAN Place 1 drop into both eyes 3 (three) times daily.   doxycycline 100 MG tablet Commonly known as: VIBRA-TABS Take 1 tablet (100 mg total) by mouth every 12 (twelve) hours for 9 doses.   furosemide 40 MG tablet Commonly known as: LASIX Take 40 mg by mouth in the morning.   glimepiride 1 MG tablet Commonly known as: AMARYL Take 0.5 mg by mouth in the morning.   ibuprofen 200 MG tablet Commonly known as: ADVIL Take 1 tablet (200 mg total) by mouth every 8 (eight) hours as needed for fever, headache, mild pain or cramping (pain.). What changed:  how much  to take reasons to take this   latanoprost 0.005 % ophthalmic solution Commonly known as: XALATAN Place 1 drop into both eyes at bedtime.   loperamide 2 MG capsule Commonly known as: IMODIUM Take 1 capsule (2 mg total) by mouth as needed for diarrhea or loose stools.   magnesium oxide 400 (240 Mg) MG tablet Commonly known as: MAG-OX Take 400 mg by mouth in the morning.   Melatonin 10 MG Caps Take 20-30 mg by mouth at bedtime as needed (sleep).   metFORMIN 1000 MG tablet Commonly known as: GLUCOPHAGE Take 1,000 mg by mouth 2 (two) times daily with a meal.   multivitamin with minerals Tabs tablet Take 1 tablet by mouth daily with supper.   neomycin-bacitracin-polymyxin [id number] ointment Commonly known as: NEOSPORIN Apply 1 application topically as needed for wound care.   pantoprazole 40 MG tablet Commonly known as: PROTONIX TAKE 1 TABLET BY MOUTH EVERY DAY   pravastatin 40 MG tablet Commonly known as: PRAVACHOL Take 40 mg by mouth every evening.   saccharomyces boulardii 250 MG capsule Commonly known as: FLORASTOR Take 1 capsule (  250 mg total) by mouth 2 (two) times daily.   spironolactone 25 MG tablet Commonly known as: ALDACTONE Take 12.5 mg by mouth in the morning.   timolol 0.5 % ophthalmic solution Commonly known as: TIMOPTIC Place 1 drop into both eyes every evening.        Contact information for follow-up providers     Lemmie Evens, MD. Schedule an appointment as soon as possible for a visit in 2 week(s).   Specialty: Family Medicine Why: Hospital Follow Up Contact information: [place] [address] [phone]         Satira Sark, MD. Schedule an appointment as soon as possible for a visit in 3 week(s).   Specialty: Cardiology Why: Hospital Follow Up Contact information: [place] [address] [phone]              Contact information for after-discharge care     [facility] .   Service: Skilled Nursing Contact information: [address] [address] [phone]                    Allergies  Allergen Reactions   Macadamia Nut Oil Anaphylaxis    Throat swelling and can't breathe   Allergies as of 08/03/2022       Reactions   Macadamia Nut Oil Anaphylaxis   Throat swelling and can't breathe        Medication List     STOP taking these medications    diphenhydrAMINE 25 MG tablet Commonly known as: BENADRYL   hydrochlorothiazide 25 MG tablet Commonly known as: HYDRODIURIL   HYDROcodone-acetaminophen 7.5-325 MG tablet Commonly known as: NORCO   hydrocortisone cream 1 %       TAKE these medications    acetaminophen 325 MG tablet Commonly known as: TYLENOL Take 2 tablets (650 mg total) by mouth every 6 (six) hours as needed for mild pain (or Fever >/= 101).   aspirin 81 MG chewable tablet Chew 1 tablet (81 mg total) by mouth daily. Can swallow whole   brimonidine 0.2 % ophthalmic solution Commonly known as: ALPHAGAN Place 1 drop into both eyes 3 (three) times daily.   doxycycline 100 MG tablet Commonly known as: VIBRA-TABS Take 1 tablet (100 mg total) by mouth every 12 (twelve) hours for 9 doses.   furosemide 40 MG tablet Commonly known as: LASIX Take 40 mg by mouth in the morning.   glimepiride 1 MG tablet Commonly known as: AMARYL Take 0.5 mg by mouth in the morning.   ibuprofen 200 MG tablet Commonly known as: ADVIL Take 1 tablet (200 mg total) by mouth every 8 (eight) hours as needed for fever, headache, mild pain or cramping (pain.). What changed:  how much to take reasons to take this   latanoprost 0.005 % ophthalmic solution Commonly known as: XALATAN Place 1 drop into both eyes at bedtime.   loperamide 2 MG capsule Commonly known as: IMODIUM Take 1 capsule (2 mg total) by mouth as needed for diarrhea or loose  stools.   magnesium oxide 400 (240 Mg) MG tablet Commonly known as: MAG-OX Take 400 mg by mouth in the morning.   Melatonin 10 MG Caps Take 20-30 mg by mouth at bedtime as needed (sleep).   metFORMIN 1000 MG tablet Commonly known as: GLUCOPHAGE Take 1,000 mg by mouth 2 (two) times daily with a meal.  multivitamin with minerals Tabs tablet Take 1 tablet by mouth daily with supper.   neomycin-bacitracin-polymyxin [id number] ointment Commonly known as: NEOSPORIN Apply 1 application topically as needed for wound care.   pantoprazole 40 MG tablet Commonly known as: PROTONIX TAKE 1 TABLET BY MOUTH EVERY DAY   pravastatin 40 MG tablet Commonly known as: PRAVACHOL Take 40 mg by mouth every evening.   saccharomyces boulardii 250 MG capsule Commonly known as: FLORASTOR Take 1 capsule (250 mg total) by mouth 2 (two) times daily.   spironolactone 25 MG tablet Commonly known as: ALDACTONE Take 12.5 mg by mouth in the morning.   timolol 0.5 % ophthalmic solution Commonly known as: TIMOPTIC Place 1 drop into both eyes every evening.        Procedures/Studies: US Abdomen Limited RUQ (LIVER/GB)  Result Date: 07/30/2022 CLINICAL DATA:  Supratherapeutic INR. EXAM: ULTRASOUND ABDOMEN LIMITED RIGHT UPPER QUADRANT COMPARISON:  CT abdomen pelvis 03/21/2022 FINDINGS: Gallbladder: Negative for gallstones.  Gallbladder wall 3 mm, upper normal. Common bile duct: Diameter: 3.2 mm Liver: No focal lesion identified. Within normal limits in parenchymal echogenicity. Small cyst left lobe liver is identified on recent CT. Portal vein is patent on color Doppler imaging with normal direction of blood flow towards the liver. Other: Trace right pleural effusion IMPRESSION: Negative for gallstones or biliary dilatation. Gallbladder wall upper normal. No focal liver lesion. Electronically Signed   By: Franchot Gallo M.D.   On: 07/30/2022 12:52   DG Chest Port 1 View  Result Date: 07/29/2022 CLINICAL  DATA:  Left lower extremity peripheral swelling, possible sepsis, initial encounter EXAM: PORTABLE CHEST 1 VIEW COMPARISON:  03/21/2022 FINDINGS: Cardiac shadow is stable. 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 Aortic calcifications are seen. The lungs are well aerated bilaterally. Skin folds are noted over the right chest. No focal infiltrate or effusion is seen. No bony abnormality is noted. IMPRESSION: No acute abnormality noted. Electronically Signed   By: Inez Catalina M.D.   On: 07/29/2022 19:34     Subjective: Pt reports feeling weak with standing but otherwise feels well.  Agreeable to rehab placement.   Discharge Exam: Vitals:   08/03/22 0610 08/03/22 0729  BP: 132/64   Pulse: 64   Resp:    Temp: 98.3 F (36.8 C)   SpO2: 98% 93%   Vitals:   08/02/22 2116 08/03/22 0133 08/03/22 0610 08/03/22 0729  BP: (!) 119/54  132/64   Pulse: 65  64   Resp:      Temp: 98.4 F (36.9 C)  98.3 F (36.8 C)   TempSrc: Oral  Oral   SpO2: 98% 92% 98% 93%  Weight:      Height:       General exam: Appears calm and comfortable, NAD.  Respiratory system: Clear to auscultation. Respiratory effort normal. Cardiovascular system: S1 & S2 heard, RRR.  Gastrointestinal system: Abdomen is soft Central nervous system: Alert and awake Extremities: Left arm erythema markedly improved.  Left foot wounds with dressings clean dry and intact Skin: No significant lesions noted Psychiatry: normal mood.   The results of significant diagnostics from this hospitalization (including imaging, microbiology, ancillary and laboratory) are listed below for reference.     Microbiology: Recent Results (from the past 240 hour(s))  Resp Panel by RT-PCR (Flu A&B, Covid) Anterior Nasal Swab     Status: None   Collection Time: 07/29/22  6:23 PM   Specimen: Anterior Nasal Swab  Result Value Ref Range Status   SARS Coronavirus 2  by RT PCR NEGATIVE NEGATIVE Final    Comment: (NOTE) SARS-CoV-2 target nucleic acids are NOT  DETECTED.  The SARS-CoV-2 RNA is generally detectable in upper respiratory specimens during the acute phase of infection. The lowest concentration of SARS-CoV-2 viral copies this assay can detect is 138 copies/mL. A negative result does not preclude SARS-Cov-2 infection and should not be used as the sole basis for treatment or other patient management decisions. A negative result may occur with  improper specimen collection/handling, submission of specimen other than nasopharyngeal swab, presence of viral mutation(s) within the areas targeted by this assay, and inadequate number of viral copies(<138 copies/mL). A negative result must be combined with clinical observations, patient history, and epidemiological information. The expected result is Negative.  Fact Sheet for Patients:  EntrepreneurPulse.com.au  Fact Sheet for Healthcare Providers:  IncredibleEmployment.be  This test is no t yet approved or cleared by the [place] FDA and  has been authorized for detection and/or diagnosis of SARS-CoV-2 by FDA under an Emergency Use Authorization (EUA). This EUA will remain  in effect (meaning this test can be used) for the duration of the COVID-19 declaration under Section 564(b)(1) of the Act, 21 U.S.C.section 360bbb-3(b)(1), unless the authorization is terminated  or revoked sooner.       Influenza A by PCR NEGATIVE NEGATIVE Final   Influenza B by PCR NEGATIVE NEGATIVE Final    Comment: (NOTE) The Xpert Xpress SARS-CoV-2/FLU/RSV plus assay is intended as an aid in the diagnosis of influenza from Nasopharyngeal swab specimens and should not be used as a sole basis for treatment. Nasal washings and aspirates are unacceptable for Xpert Xpress SARS-CoV-2/FLU/RSV testing.  Fact Sheet for Patients: EntrepreneurPulse.com.au  Fact Sheet for Healthcare Providers: IncredibleEmployment.be  This test is not yet  approved or cleared by the [place] FDA and has been authorized for detection and/or diagnosis of SARS-CoV-2 by FDA under an Emergency Use Authorization (EUA). This EUA will remain in effect (meaning this test can be used) for the duration of the COVID-19 declaration under Section 564(b)(1) of the Act, 21 U.S.C. section 360bbb-3(b)(1), unless the authorization is terminated or revoked.  Performed at [facility], [address] [place], [place] [address]   Urine Culture     Status: Abnormal   Collection Time: 07/29/22  6:23 PM   Specimen: In/Out Cath Urine  Result Value Ref Range Status   Specimen Description   Final    IN/OUT CATH URINE Performed at [facility], [address] [place], [place] [address]    Special Requests   Final    NONE Performed at [facility], [address] [place], [place] [address]    Culture MULTIPLE SPECIES PRESENT, SUGGEST RECOLLECTION (A)  Final   Report Status 07/31/2022 FINAL  Final  Culture, blood (Routine x 2)     Status: None   Collection Time: 07/29/22  7:08 PM   Specimen: BLOOD RIGHT WRIST  Result Value Ref Range Status   Specimen Description BLOOD RIGHT WRIST  Final   Special Requests   Final    BOTTLES DRAWN AEROBIC AND ANAEROBIC Blood Culture adequate volume   Culture   Final    NO GROWTH 5 DAYS Performed at [facility], [address] [place], [place] [address]    Report Status 08/03/2022 FINAL  Final  Culture, blood (Routine x 2)     Status: None   Collection Time: 07/29/22  7:08 PM   Specimen: Right Antecubital; Blood  Result Value Ref Range Status  Specimen Description RIGHT ANTECUBITAL  Final   Special Requests   Final    BOTTLES DRAWN AEROBIC AND ANAEROBIC Blood Culture results may not be optimal due to an excessive volume of blood received in culture bottles   Culture   Final    NO GROWTH 5 DAYS Performed at [facility], [address], [place], [place] [address]    Report Status 08/03/2022 FINAL  Final  MRSA Next  Gen by PCR, Nasal     Status: None   Collection Time: 08/01/22  2:04 PM   Specimen: Nasal Mucosa; Nasal Swab  Result Value Ref Range Status   MRSA by PCR Next Gen NOT DETECTED NOT DETECTED Final    Comment: (NOTE) The GeneXpert MRSA Assay (FDA approved for NASAL specimens only), is one component of a comprehensive MRSA colonization surveillance program. It is not intended to diagnose MRSA infection nor to guide or monitor treatment for MRSA infections. Test performance is not FDA approved in patients less than 49 years old. Performed at [facility], [address], [place], [place] [address]      Labs: BNP (last 3 results) No results for input(s): "BNP" in the last 8760 hours. Basic Metabolic Panel: Recent Labs  Lab 07/29/22 1806 07/30/22 0410 07/31/22 0604 08/01/22 0456  NA 136 136 136 134*  K 5.3* 4.5 4.2 4.4  CL 103 105 106 104  CO2 22 22 27 25   GLUCOSE 148* 187* 69* 177*  BUN 30* 31* 28* 29*  CREATININE 1.27* 1.32* 1.04 1.14  CALCIUM 7.7* 7.4* 7.6* 7.6*  MG  --  1.1* 1.5* 2.5*  PHOS  --  2.4*  --   --    Liver Function Tests: Recent Labs  Lab 07/29/22 1806  AST 22  ALT 17  ALKPHOS 91  BILITOT 0.9  PROT 5.1*  ALBUMIN 2.4*   No results for input(s): "LIPASE", "AMYLASE" in the last 168 hours. No results for input(s): "AMMONIA" in the last 168 hours. CBC: Recent Labs  Lab 07/29/22 1806 07/30/22 0410 07/31/22 0604 08/01/22 0456  WBC 6.4 8.5 8.2 7.6  NEUTROABS 4.9  --   --   --   HGB 13.4 11.6* 10.8* 12.0*  HCT 41.7 35.8* 33.6* 37.8*  MCV 98.8 98.6 97.7 102.2*  PLT 162 140* 133* 151   Cardiac Enzymes: No results for input(s): "CKTOTAL", "CKMB", "CKMBINDEX", "TROPONINI" in the last 168 hours. BNP: Invalid input(s): "POCBNP" CBG: Recent Labs  Lab 08/02/22 0729 08/02/22 1147 08/02/22 1630 08/02/22 2117 08/03/22 0814  GLUCAP 157* 136* 241* 206* 155*   D-Dimer No results for input(s): "DDIMER" in the last 72 hours. Hgb A1c No results for  input(s): "HGBA1C" in the last 72 hours. Lipid Profile No results for input(s): "CHOL", "HDL", "LDLCALC", "TRIG", "CHOLHDL", "LDLDIRECT" in the last 72 hours. Thyroid function studies No results for input(s): "TSH", "T4TOTAL", "T3FREE", "THYROIDAB" in the last 72 hours.  Invalid input(s): "FREET3" Anemia work up No results for input(s): "VITAMINB12", "FOLATE", "FERRITIN", "TIBC", "IRON", "RETICCTPCT" in the last 72 hours. Urinalysis    Component Value Date/Time   COLORURINE YELLOW 07/29/2022 1803   APPEARANCEUR CLEAR 07/29/2022 1803   LABSPEC 1.010 07/29/2022 1803   PHURINE 5.0 07/29/2022 1803   GLUCOSEU NEGATIVE 07/29/2022 1803   HGBUR NEGATIVE 07/29/2022 1803   BILIRUBINUR NEGATIVE 07/29/2022 1803   KETONESUR NEGATIVE 07/29/2022 1803   PROTEINUR NEGATIVE 07/29/2022 1803   NITRITE NEGATIVE 07/29/2022 1803   LEUKOCYTESUR NEGATIVE 07/29/2022 1803   Sepsis Labs Recent Labs  Lab  07/29/22 1806 07/30/22 0410 07/31/22 0604 08/01/22 0456  WBC 6.4 8.5 8.2 7.6   Microbiology Recent Results (from the past 240 hour(s))  Resp Panel by RT-PCR (Flu A&B, Covid) Anterior Nasal Swab     Status: None   Collection Time: 07/29/22  6:23 PM   Specimen: Anterior Nasal Swab  Result Value Ref Range Status   SARS Coronavirus 2 by RT PCR NEGATIVE NEGATIVE Final    Comment: (NOTE) SARS-CoV-2 target nucleic acids are NOT DETECTED.  The SARS-CoV-2 RNA is generally detectable in upper respiratory specimens during the acute phase of infection. The lowest concentration of SARS-CoV-2 viral copies this assay can detect is 138 copies/mL. A negative result does not preclude SARS-Cov-2 infection and should not be used as the sole basis for treatment or other patient management decisions. A negative result may occur with  improper specimen collection/handling, submission of specimen other than nasopharyngeal swab, presence of viral mutation(s) within the areas targeted by this assay, and inadequate  number of viral copies(<138 copies/mL). A negative result must be combined with clinical observations, patient history, and epidemiological information. The expected result is Negative.  Fact Sheet for Patients:  EntrepreneurPulse.com.au  Fact Sheet for Healthcare Providers:  IncredibleEmployment.be  This test is no t yet approved or cleared by the [place] FDA and  has been authorized for detection and/or diagnosis of SARS-CoV-2 by FDA under an Emergency Use Authorization (EUA). This EUA will remain  in effect (meaning this test can be used) for the duration of the COVID-19 declaration under Section 564(b)(1) of the Act, 21 U.S.C.section 360bbb-3(b)(1), unless the authorization is terminated  or revoked sooner.       Influenza A by PCR NEGATIVE NEGATIVE Final   Influenza B by PCR NEGATIVE NEGATIVE Final    Comment: (NOTE) The Xpert Xpress SARS-CoV-2/FLU/RSV plus assay is intended as an aid in the diagnosis of influenza from Nasopharyngeal swab specimens and should not be used as a sole basis for treatment. Nasal washings and aspirates are unacceptable for Xpert Xpress SARS-CoV-2/FLU/RSV testing.  Fact Sheet for Patients: EntrepreneurPulse.com.au  Fact Sheet for Healthcare Providers: IncredibleEmployment.be  This test is not yet approved or cleared by the [place] FDA and has been authorized for detection and/or diagnosis of SARS-CoV-2 by FDA under an Emergency Use Authorization (EUA). This EUA will remain in effect (meaning this test can be used) for the duration of the COVID-19 declaration under Section 564(b)(1) of the Act, 21 U.S.C. section 360bbb-3(b)(1), unless the authorization is terminated or revoked.  Performed at [facility], [address] [place], [place] [address]   Urine Culture     Status: Abnormal   Collection Time: 07/29/22  6:23 PM   Specimen: In/Out Cath Urine  Result  Value Ref Range Status   Specimen Description   Final    IN/OUT CATH URINE Performed at [facility], [address], [place], [place] [address]    Special Requests   Final    NONE Performed at [facility], [address] [place], [place] [address]    Culture MULTIPLE SPECIES PRESENT, SUGGEST RECOLLECTION (A)  Final   Report Status 07/31/2022 FINAL  Final  Culture, blood (Routine x 2)     Status: None   Collection Time: 07/29/22  7:08 PM   Specimen: BLOOD RIGHT WRIST  Result Value Ref Range Status   Specimen Description BLOOD RIGHT WRIST  Final   Special Requests   Final    BOTTLES DRAWN AEROBIC AND ANAEROBIC Blood Culture adequate  volume   Culture   Final    NO GROWTH 5 DAYS Performed at [facility], [address] [place], [place] [address]    Report Status 08/03/2022 FINAL  Final  Culture, blood (Routine x 2)     Status: None   Collection Time: 07/29/22  7:08 PM   Specimen: Right Antecubital; Blood  Result Value Ref Range Status   Specimen Description RIGHT ANTECUBITAL  Final   Special Requests   Final    BOTTLES DRAWN AEROBIC AND ANAEROBIC Blood Culture results may not be optimal due to an excessive volume of blood received in culture bottles   Culture   Final    NO GROWTH 5 DAYS Performed at [facility], [address], [place], [place] [address]    Report Status 08/03/2022 FINAL  Final  MRSA Next Gen by PCR, Nasal     Status: None   Collection Time: 08/01/22  2:04 PM   Specimen: Nasal Mucosa; Nasal Swab  Result Value Ref Range Status   MRSA by PCR Next Gen NOT DETECTED NOT DETECTED Final    Comment: (NOTE) The GeneXpert MRSA Assay (FDA approved for NASAL specimens only), is one component of a comprehensive MRSA colonization surveillance program. It is not intended to diagnose MRSA infection nor to guide or monitor treatment for MRSA infections. Test performance is not FDA approved in patients less than 33 years old. Performed at [facility], [address]  [place], [place] [address]    Time coordinating discharge:  35 mins   SIGNED:  Irwin Brakeman, MD  [facility] 08/03/2022, 10:59 AM How to contact the [facility] Attending or Consulting provider [place] or covering provider during after hours [place], for this patient?  Check the care team in [facility] and look for a) attending/consulting TR[facility] provider listed and b) the [facility] team listed Log into [email or web address] and use [place]'s universal password to access. If you do not have the password, please contact the hospital operator. Locate the [facility] provider you are looking for under [facility] and page to a number that you can be directly reached. If you still have difficulty reaching the provider, please page the [facility] (Director on Call) for the Hospitalists listed on amion for assistance.

## 2022-08-03 NOTE — TOC Transition Note (Addendum)
Transition of Care [facility] - CM/SW Discharge Note   Patient Details  Name: Patrick Beck MRN: [id number] Date of Birth: [birth year]  Transition of Care [facility] CM/SW Contact:  Salome Arnt, LCSW Phone Number: 08/03/2022, 11:37 AM   Clinical Narrative:  Pt d/c today to [facility]. Pt's significant other, [name] and facility aware and agreeable. D/C summary sent to SNF. RN given number to call report. [name] requests transport via [employer]. Authorization received.       Final next level of care: Skilled Nursing Facility Barriers to Discharge: Barriers Resolved   Patient Goals and CMS Choice Patient states their goals for this hospitalization and ongoing recovery are:: get better CMS Medicare.gov Compare Post Acute Care list provided to:: Patient Choice offered to / list presented to : Patient  Discharge Placement              Patient chooses bed at: [facility] Patient to be transferred to facility by: [facility] Name of family member notified: [name] Patient and family notified of of transfer: 08/03/22  Discharge Plan and Services In-house Referral: Clinical Social Work   Post Acute Care Choice: [place]                               Social Determinants of Health (SDOH) Interventions     Readmission Risk Interventions    08/02/2022   11:42 AM  Readmission Risk Prevention Plan  PCP or Specialist Appt within 5-7 Days Complete  Home Care Screening Complete  Medication Review (RN CM) Complete

## 2022-08-03 NOTE — Discharge Instructions (Signed)
IMPORTANT INFORMATION: PAY CLOSE ATTENTION   PHYSICIAN DISCHARGE INSTRUCTIONS  Follow with Primary care provider  Knowlton, Steve, MD  and other consultants as instructed by your Hospitalist Physician  SEEK MEDICAL CARE OR RETURN TO EMERGENCY ROOM IF SYMPTOMS COME BACK, WORSEN OR NEW PROBLEM DEVELOPS   Please note: You were cared for by a hospitalist during your hospital stay. Every effort will be made to forward records to your primary care provider.  You can request that your primary care provider send for your hospital records if they have not received them.  Once you are discharged, your primary care physician will handle any further medical issues. Please note that NO REFILLS for any discharge medications will be authorized once you are discharged, as it is imperative that you return to your primary care physician (or establish a relationship with a primary care physician if you do not have one) for your post hospital discharge needs so that they can reassess your need for medications and monitor your lab values.  Please get a complete blood count and chemistry panel checked by your Primary MD at your next visit, and again as instructed by your Primary MD.  Get Medicines reviewed and adjusted: Please take all your medications with you for your next visit with your Primary MD  Laboratory/radiological data: Please request your Primary MD to go over all hospital tests and procedure/radiological results at the follow up, please ask your primary care provider to get all Hospital records sent to his/her office.  In some cases, they will be blood work, cultures and biopsy results pending at the time of your discharge. Please request that your primary care provider follow up on these results.  If you are diabetic, please bring your blood sugar readings with you to your follow up appointment with primary care.    Please call and make your follow up appointments as soon as possible.    Also Note  the following: If you experience worsening of your admission symptoms, develop shortness of breath, life threatening emergency, suicidal or homicidal thoughts you must seek medical attention immediately by calling 911 or calling your MD immediately  if symptoms less severe.  You must read complete instructions/literature along with all the possible adverse reactions/side effects for all the Medicines you take and that have been prescribed to you. Take any new Medicines after you have completely understood and accpet all the possible adverse reactions/side effects.   Do not drive when taking Pain medications or sleeping medications (Benzodiazepines)  Do not take more than prescribed Pain, Sleep and Anxiety Medications. It is not advisable to combine anxiety,sleep and pain medications without talking with your primary care practitioner  Special Instructions: If you have smoked or chewed Tobacco  in the last 2 yrs please stop smoking, stop any regular Alcohol  and or any Recreational drug use.  Wear Seat belts while driving.  Do not drive if taking any narcotic, mind altering or controlled substances or recreational drugs or alcohol.       

## 2022-08-03 NOTE — Progress Notes (Signed)
Secretary called and notified that [facility] called and refused to take pt tonight.

## 2022-08-04 LAB — GLUCOSE, CAPILLARY: Glucose-Capillary: 164 mg/dL — ABNORMAL HIGH (ref 70–99)

## 2022-08-04 NOTE — Plan of Care (Signed)
Pt pending D/c to [facility]. EMS arrived during overnight. [facility] would not accept at that time.  Problem: Activity: Goal: Risk for activity intolerance will decrease Outcome: Not Progressing  Pt continues to have weakness. 2 max assist to bsc unable to stand alone. Must have 2 assist to move and ambulate. Unable to obtain Daily Weight due to unable to stand without assistance and bed scale not accurate.  Problem: Education: Goal: Knowledge of General Education information will improve Description: Including pain rating scale, medication(s)/side effects and non-pharmacologic comfort measures Outcome: Progressing   Problem: Health Behavior/Discharge Planning: Goal: Ability to manage health-related needs will improve Outcome: Progressing   Problem: Clinical Measurements: Goal: Ability to maintain clinical measurements within normal limits will improve Outcome: Progressing Goal: Will remain free from infection Outcome: Progressing Goal: Diagnostic test results will improve Outcome: Progressing Goal: Respiratory complications will improve Outcome: Progressing Goal: Cardiovascular complication will be avoided Outcome: Progressing

## 2022-08-28 ENCOUNTER — Inpatient Hospital Stay (HOSPITAL_COMMUNITY)
Admission: EM | Admit: 2022-08-28 | Discharge: 2022-08-31 | DRG: 871 | Disposition: A | Discharge status: Skilled Nursing Facility | Payer: Medicare Other | Source: Ambulatory Visit | Attending: Family Medicine | Admitting: Family Medicine

## 2022-08-28 ENCOUNTER — Emergency Department (HOSPITAL_COMMUNITY): Payer: Medicare Other

## 2022-08-28 ENCOUNTER — Other Ambulatory Visit: Payer: Self-pay

## 2022-08-28 ENCOUNTER — Encounter (HOSPITAL_COMMUNITY): Payer: Self-pay | Admitting: *Deleted

## 2022-08-28 DIAGNOSIS — Z91018 Allergy to other foods: Secondary | ICD-10-CM

## 2022-08-28 DIAGNOSIS — R0902 Hypoxemia: Secondary | ICD-10-CM | POA: Diagnosis present

## 2022-08-28 DIAGNOSIS — D72829 Elevated white blood cell count, unspecified: Secondary | ICD-10-CM | POA: Diagnosis present

## 2022-08-28 DIAGNOSIS — F419 Anxiety disorder, unspecified: Secondary | ICD-10-CM | POA: Diagnosis present

## 2022-08-28 DIAGNOSIS — Z681 Body mass index (BMI) 19 or less, adult: Secondary | ICD-10-CM | POA: Diagnosis not present

## 2022-08-28 DIAGNOSIS — R6520 Severe sepsis without septic shock: Secondary | ICD-10-CM | POA: Diagnosis present

## 2022-08-28 DIAGNOSIS — L8990 Pressure ulcer of unspecified site, unspecified stage: Secondary | ICD-10-CM | POA: Diagnosis present

## 2022-08-28 DIAGNOSIS — L89102 Pressure ulcer of unspecified part of back, stage 2: Secondary | ICD-10-CM | POA: Diagnosis present

## 2022-08-28 DIAGNOSIS — E782 Mixed hyperlipidemia: Secondary | ICD-10-CM | POA: Diagnosis present

## 2022-08-28 DIAGNOSIS — K219 Gastro-esophageal reflux disease without esophagitis: Secondary | ICD-10-CM

## 2022-08-28 DIAGNOSIS — E43 Unspecified severe protein-calorie malnutrition: Secondary | ICD-10-CM | POA: Diagnosis present

## 2022-08-28 DIAGNOSIS — J440 Chronic obstructive pulmonary disease with acute lower respiratory infection: Secondary | ICD-10-CM | POA: Diagnosis present

## 2022-08-28 DIAGNOSIS — I959 Hypotension, unspecified: Secondary | ICD-10-CM | POA: Diagnosis present

## 2022-08-28 DIAGNOSIS — A419 Sepsis, unspecified organism: Secondary | ICD-10-CM | POA: Diagnosis present

## 2022-08-28 DIAGNOSIS — R627 Adult failure to thrive: Secondary | ICD-10-CM | POA: Diagnosis not present

## 2022-08-28 DIAGNOSIS — E860 Dehydration: Secondary | ICD-10-CM

## 2022-08-28 DIAGNOSIS — Z7984 Long term (current) use of oral hypoglycemic drugs: Secondary | ICD-10-CM

## 2022-08-28 DIAGNOSIS — R64 Cachexia: Secondary | ICD-10-CM | POA: Diagnosis present

## 2022-08-28 DIAGNOSIS — Z7982 Long term (current) use of aspirin: Secondary | ICD-10-CM

## 2022-08-28 DIAGNOSIS — J690 Pneumonitis due to inhalation of food and vomit: Secondary | ICD-10-CM | POA: Diagnosis present

## 2022-08-28 DIAGNOSIS — J189 Pneumonia, unspecified organism: Secondary | ICD-10-CM | POA: Diagnosis not present

## 2022-08-28 DIAGNOSIS — I700 Atherosclerosis of aorta: Secondary | ICD-10-CM | POA: Diagnosis present

## 2022-08-28 DIAGNOSIS — I110 Hypertensive heart disease with heart failure: Secondary | ICD-10-CM | POA: Diagnosis present

## 2022-08-28 DIAGNOSIS — R0602 Shortness of breath: Secondary | ICD-10-CM | POA: Diagnosis present

## 2022-08-28 DIAGNOSIS — Z9981 Dependence on supplemental oxygen: Secondary | ICD-10-CM | POA: Diagnosis not present

## 2022-08-28 DIAGNOSIS — J449 Chronic obstructive pulmonary disease, unspecified: Secondary | ICD-10-CM | POA: Diagnosis not present

## 2022-08-28 DIAGNOSIS — I5032 Chronic diastolic (congestive) heart failure: Secondary | ICD-10-CM

## 2022-08-28 DIAGNOSIS — H409 Unspecified glaucoma: Secondary | ICD-10-CM | POA: Diagnosis present

## 2022-08-28 DIAGNOSIS — Z515 Encounter for palliative care: Secondary | ICD-10-CM | POA: Diagnosis not present

## 2022-08-28 DIAGNOSIS — Z79899 Other long term (current) drug therapy: Secondary | ICD-10-CM

## 2022-08-28 DIAGNOSIS — I10 Essential (primary) hypertension: Secondary | ICD-10-CM | POA: Diagnosis not present

## 2022-08-28 DIAGNOSIS — Z952 Presence of prosthetic heart valve: Secondary | ICD-10-CM

## 2022-08-28 DIAGNOSIS — Z66 Do not resuscitate: Secondary | ICD-10-CM | POA: Diagnosis not present

## 2022-08-28 DIAGNOSIS — E1149 Type 2 diabetes mellitus with other diabetic neurological complication: Secondary | ICD-10-CM | POA: Diagnosis present

## 2022-08-28 DIAGNOSIS — E1151 Type 2 diabetes mellitus with diabetic peripheral angiopathy without gangrene: Secondary | ICD-10-CM | POA: Diagnosis present

## 2022-08-28 DIAGNOSIS — N179 Acute kidney failure, unspecified: Secondary | ICD-10-CM

## 2022-08-28 DIAGNOSIS — E11649 Type 2 diabetes mellitus with hypoglycemia without coma: Secondary | ICD-10-CM | POA: Diagnosis present

## 2022-08-28 DIAGNOSIS — Z825 Family history of asthma and other chronic lower respiratory diseases: Secondary | ICD-10-CM

## 2022-08-28 DIAGNOSIS — F32A Depression, unspecified: Secondary | ICD-10-CM | POA: Diagnosis present

## 2022-08-28 DIAGNOSIS — M1990 Unspecified osteoarthritis, unspecified site: Secondary | ICD-10-CM | POA: Diagnosis present

## 2022-08-28 DIAGNOSIS — I2510 Atherosclerotic heart disease of native coronary artery without angina pectoris: Secondary | ICD-10-CM | POA: Diagnosis present

## 2022-08-28 DIAGNOSIS — I739 Peripheral vascular disease, unspecified: Secondary | ICD-10-CM | POA: Diagnosis present

## 2022-08-28 DIAGNOSIS — K529 Noninfective gastroenteritis and colitis, unspecified: Secondary | ICD-10-CM | POA: Diagnosis present

## 2022-08-28 DIAGNOSIS — E119 Type 2 diabetes mellitus without complications: Secondary | ICD-10-CM

## 2022-08-28 DIAGNOSIS — F17210 Nicotine dependence, cigarettes, uncomplicated: Secondary | ICD-10-CM | POA: Diagnosis present

## 2022-08-28 DIAGNOSIS — Z953 Presence of xenogenic heart valve: Secondary | ICD-10-CM | POA: Diagnosis not present

## 2022-08-28 DIAGNOSIS — E872 Acidosis, unspecified: Secondary | ICD-10-CM | POA: Diagnosis present

## 2022-08-28 DIAGNOSIS — I350 Nonrheumatic aortic (valve) stenosis: Secondary | ICD-10-CM

## 2022-08-28 DIAGNOSIS — E8809 Other disorders of plasma-protein metabolism, not elsewhere classified: Secondary | ICD-10-CM | POA: Diagnosis present

## 2022-08-28 DIAGNOSIS — R54 Age-related physical debility: Secondary | ICD-10-CM | POA: Diagnosis present

## 2022-08-28 DIAGNOSIS — F17200 Nicotine dependence, unspecified, uncomplicated: Secondary | ICD-10-CM | POA: Diagnosis present

## 2022-08-28 DIAGNOSIS — Z7189 Other specified counseling: Secondary | ICD-10-CM | POA: Diagnosis not present

## 2022-08-28 HISTORY — DX: Heart failure, unspecified: I50.9

## 2022-08-28 LAB — CBC WITH DIFFERENTIAL/PLATELET
Abs Immature Granulocytes: 0.07 10*3/uL (ref 0.00–0.07)
Basophils Absolute: 0 10*3/uL (ref 0.0–0.1)
Basophils Relative: 0 %
Eosinophils Absolute: 0.1 10*3/uL (ref 0.0–0.5)
Eosinophils Relative: 1 %
HCT: 35.7 % — ABNORMAL LOW (ref 39.0–52.0)
Hemoglobin: 11.6 g/dL — ABNORMAL LOW (ref 13.0–17.0)
Immature Granulocytes: 1 %
Lymphocytes Relative: 12 %
Lymphs Abs: 1.4 10*3/uL (ref 0.7–4.0)
MCH: 32.4 pg (ref 26.0–34.0)
MCHC: 32.5 g/dL (ref 30.0–36.0)
MCV: 99.7 fL (ref 80.0–100.0)
Monocytes Absolute: 0.4 10*3/uL (ref 0.1–1.0)
Monocytes Relative: 4 %
Neutro Abs: 9.5 10*3/uL — ABNORMAL HIGH (ref 1.7–7.7)
Neutrophils Relative %: 82 %
Platelets: 171 10*3/uL (ref 150–400)
RBC: 3.58 MIL/uL — ABNORMAL LOW (ref 4.22–5.81)
RDW: 15.5 % (ref 11.5–15.5)
WBC: 11.5 10*3/uL — ABNORMAL HIGH (ref 4.0–10.5)
nRBC: 0 % (ref 0.0–0.2)

## 2022-08-28 LAB — COMPREHENSIVE METABOLIC PANEL
ALT: 21 U/L (ref 0–44)
AST: 23 U/L (ref 15–41)
Albumin: 1.9 g/dL — ABNORMAL LOW (ref 3.5–5.0)
Alkaline Phosphatase: 144 U/L — ABNORMAL HIGH (ref 38–126)
Anion gap: 12 (ref 5–15)
BUN: 49 mg/dL — ABNORMAL HIGH (ref 8–23)
CO2: 21 mmol/L — ABNORMAL LOW (ref 22–32)
Calcium: 5.8 mg/dL — CL (ref 8.9–10.3)
Chloride: 103 mmol/L (ref 98–111)
Creatinine, Ser: 1.75 mg/dL — ABNORMAL HIGH (ref 0.61–1.24)
GFR, Estimated: 40 mL/min — ABNORMAL LOW (ref >60)
Glucose, Bld: 57 mg/dL — ABNORMAL LOW (ref 70–99)
Potassium: 4.1 mmol/L (ref 3.5–5.1)
Sodium: 136 mmol/L (ref 135–145)
Total Bilirubin: 0.6 mg/dL (ref 0.3–1.2)
Total Protein: 4.3 g/dL — ABNORMAL LOW (ref 6.5–8.1)

## 2022-08-28 LAB — TSH: TSH: 4.47 u[IU]/mL (ref 0.350–4.500)

## 2022-08-28 LAB — LACTIC ACID, PLASMA
Lactic Acid, Venous: 4.7 mmol/L (ref 0.5–1.9)
Lactic Acid, Venous: 4.8 mmol/L (ref 0.5–1.9)
Lactic Acid, Venous: 4 mmol/L (ref 0.5–1.9)
Lactic Acid, Venous: 6.1 mmol/L (ref 0.5–1.9)

## 2022-08-28 LAB — GLUCOSE, CAPILLARY: Glucose-Capillary: 350 mg/dL — ABNORMAL HIGH (ref 70–99)

## 2022-08-28 LAB — CBG MONITORING, ED
Glucose-Capillary: 39 mg/dL — CL (ref 70–99)
Glucose-Capillary: 129 mg/dL — ABNORMAL HIGH (ref 70–99)

## 2022-08-28 LAB — PROTIME-INR
INR: 1.3 — ABNORMAL HIGH (ref 0.8–1.2)
Prothrombin Time: 16.2 seconds — ABNORMAL HIGH (ref 11.4–15.2)

## 2022-08-28 LAB — TROPONIN I (HIGH SENSITIVITY)
Troponin I (High Sensitivity): 14 ng/L (ref <18)
Troponin I (High Sensitivity): 15 ng/L (ref <18)

## 2022-08-28 LAB — BRAIN NATRIURETIC PEPTIDE: B Natriuretic Peptide: 57 pg/mL (ref 0.0–100.0)

## 2022-08-28 LAB — MAGNESIUM: Magnesium: 0.9 mg/dL — CL (ref 1.7–2.4)

## 2022-08-28 LAB — MRSA NEXT GEN BY PCR, NASAL: MRSA by PCR Next Gen: NOT DETECTED

## 2022-08-28 MED ORDER — DEXTROSE 50 % IV SOLN: 1.0000 | Freq: Once | INTRAVENOUS | Status: AC

## 2022-08-28 MED ORDER — ONDANSETRON HCL 4 MG PO TABS: 4.0000 mg | ORAL_TABLET | Freq: Four times a day (QID) | ORAL | Status: DC | PRN

## 2022-08-28 MED ORDER — LACTATED RINGERS IV BOLUS (SEPSIS)
500.0000 mL | Freq: Once | INTRAVENOUS | Status: AC
  Administered 2022-08-28: 500 mL via INTRAVENOUS

## 2022-08-28 MED ORDER — SACCHAROMYCES BOULARDII 250 MG PO CAPS
250.0000 mg | ORAL_CAPSULE | Freq: Two times a day (BID) | ORAL | Status: DC
  Administered 2022-08-28 – 2022-08-31 (×6): 250 mg via ORAL
  Filled 2022-08-28 (×6): qty 1

## 2022-08-28 MED ORDER — CEFEPIME HCL 2 G IV SOLR
2.0000 g | Freq: Once | INTRAVENOUS | Status: AC
  Administered 2022-08-28: 2 g via INTRAVENOUS
  Filled 2022-08-28: qty 12.5

## 2022-08-28 MED ORDER — LATANOPROST 0.005 % OP SOLN
1.0000 [drp] | Freq: Every day | OPHTHALMIC | Status: DC
Start: 2022-08-28 — End: 2022-09-01
  Administered 2022-08-28 – 2022-08-30 (×3): 1 [drp] via OPHTHALMIC
  Filled 2022-08-28: qty 2.5

## 2022-08-28 MED ORDER — PANTOPRAZOLE SODIUM 40 MG PO TBEC
40.0000 mg | DELAYED_RELEASE_TABLET | Freq: Every day | ORAL | Status: DC
  Administered 2022-08-28 – 2022-08-31 (×4): 40 mg via ORAL
  Filled 2022-08-28 (×4): qty 1

## 2022-08-28 MED ORDER — CALCIUM GLUCONATE-NACL 2-0.675 GM/100ML-% IV SOLN: 2.0000 g | Freq: Once | INTRAVENOUS | Status: DC

## 2022-08-28 MED ORDER — ADULT MULTIVITAMIN W/MINERALS CH
1.0000 | ORAL_TABLET | Freq: Every day | ORAL | Status: DC
  Administered 2022-08-28 – 2022-08-31 (×4): 1 via ORAL
  Filled 2022-08-28 (×4): qty 1

## 2022-08-28 MED ORDER — OXYCODONE HCL 5 MG PO TABS: 5.0000 mg | ORAL_TABLET | Freq: Four times a day (QID) | ORAL | Status: DC | PRN

## 2022-08-28 MED ORDER — GUAIFENESIN ER 600 MG PO TB12
1200.0000 mg | ORAL_TABLET | Freq: Two times a day (BID) | ORAL | Status: DC
  Administered 2022-08-28 – 2022-08-31 (×6): 1200 mg via ORAL
  Filled 2022-08-28 (×6): qty 2

## 2022-08-28 MED ORDER — ONDANSETRON HCL 4 MG/2ML IJ SOLN: 4.0000 mg | Freq: Four times a day (QID) | INTRAMUSCULAR | Status: DC | PRN

## 2022-08-28 MED ORDER — DEXTROSE 50 % IV SOLN
INTRAVENOUS | Status: AC
  Administered 2022-08-28: 50 mL via INTRAVENOUS
  Filled 2022-08-28: qty 50

## 2022-08-28 MED ORDER — BRIMONIDINE TARTRATE 0.2 % OP SOLN
1.0000 [drp] | Freq: Three times a day (TID) | OPHTHALMIC | Status: DC
  Administered 2022-08-28 – 2022-08-31 (×9): 1 [drp] via OPHTHALMIC
  Filled 2022-08-28 (×2): qty 5

## 2022-08-28 MED ORDER — TRAZODONE HCL 50 MG PO TABS: 25.0000 mg | ORAL_TABLET | Freq: Every evening | ORAL | Status: DC | PRN

## 2022-08-28 MED ORDER — VANCOMYCIN HCL IN DEXTROSE 1-5 GM/200ML-% IV SOLN
1000.0000 mg | Freq: Once | INTRAVENOUS | Status: AC
  Administered 2022-08-28: 1000 mg via INTRAVENOUS
  Filled 2022-08-28: qty 200

## 2022-08-28 MED ORDER — ENSURE ENLIVE PO LIQD: 237.0000 mL | Freq: Two times a day (BID) | ORAL | Status: DC

## 2022-08-28 MED ORDER — TIMOLOL MALEATE 0.5 % OP SOLN
1.0000 [drp] | Freq: Every evening | OPHTHALMIC | Status: DC
Start: 2022-08-28 — End: 2022-09-01
  Administered 2022-08-28 – 2022-08-30 (×3): 1 [drp] via OPHTHALMIC
  Filled 2022-08-28: qty 5

## 2022-08-28 MED ORDER — INSULIN ASPART 100 UNIT/ML IJ SOLN
0.0000 [IU] | Freq: Three times a day (TID) | INTRAMUSCULAR | Status: DC
  Administered 2022-08-29: 1 [IU] via SUBCUTANEOUS
  Administered 2022-08-29: 3 [IU] via SUBCUTANEOUS
  Administered 2022-08-29 – 2022-08-30 (×2): 2 [IU] via SUBCUTANEOUS
  Administered 2022-08-30: 1 [IU] via SUBCUTANEOUS

## 2022-08-28 MED ORDER — LACTATED RINGERS IV SOLN: INTRAVENOUS | Status: AC

## 2022-08-28 MED ORDER — PRAVASTATIN SODIUM 40 MG PO TABS
40.0000 mg | ORAL_TABLET | Freq: Every evening | ORAL | Status: DC
  Administered 2022-08-28 – 2022-08-31 (×4): 40 mg via ORAL
  Filled 2022-08-28 (×4): qty 1

## 2022-08-28 MED ORDER — LACTATED RINGERS IV BOLUS
1000.0000 mL | Freq: Once | INTRAVENOUS | Status: AC
  Administered 2022-08-28: 1000 mL via INTRAVENOUS

## 2022-08-28 MED ORDER — HEPARIN SODIUM (PORCINE) 5000 UNIT/ML IJ SOLN
5000.0000 [IU] | Freq: Three times a day (TID) | INTRAMUSCULAR | Status: DC
  Administered 2022-08-29 – 2022-08-31 (×8): 5000 [IU] via SUBCUTANEOUS
  Filled 2022-08-28 (×8): qty 1

## 2022-08-28 MED ORDER — LACTATED RINGERS IV SOLN: INTRAVENOUS | Status: DC

## 2022-08-28 MED ORDER — MAGNESIUM SULFATE 4 GM/100ML IV SOLN
4.0000 g | Freq: Once | INTRAVENOUS | Status: AC
  Administered 2022-08-28: 4 g via INTRAVENOUS
  Filled 2022-08-28: qty 100

## 2022-08-28 MED ORDER — IPRATROPIUM-ALBUTEROL 0.5-2.5 (3) MG/3ML IN SOLN
3.0000 mL | Freq: Four times a day (QID) | RESPIRATORY_TRACT | Status: DC
  Administered 2022-08-28 – 2022-08-29 (×6): 3 mL via RESPIRATORY_TRACT
  Filled 2022-08-28 (×6): qty 3

## 2022-08-28 MED ORDER — ACETAMINOPHEN 325 MG PO TABS
650.0000 mg | ORAL_TABLET | Freq: Four times a day (QID) | ORAL | Status: DC | PRN
  Administered 2022-08-30 – 2022-08-31 (×2): 650 mg via ORAL
  Filled 2022-08-28 (×2): qty 2

## 2022-08-28 MED ORDER — SODIUM CHLORIDE 0.9 % IV SOLN: 2.0000 g | INTRAVENOUS | Status: DC

## 2022-08-28 MED ORDER — LACTATED RINGERS IV BOLUS (SEPSIS)
250.0000 mL | Freq: Once | INTRAVENOUS | Status: AC
  Administered 2022-08-28: 250 mL via INTRAVENOUS

## 2022-08-28 MED ORDER — CALCIUM GLUCONATE-NACL 1-0.675 GM/50ML-% IV SOLN
1.0000 g | Freq: Once | INTRAVENOUS | Status: AC
  Administered 2022-08-28: 1000 mg via INTRAVENOUS
  Filled 2022-08-28: qty 50

## 2022-08-28 MED ORDER — NICOTINE 21 MG/24HR TD PT24
21.0000 mg | MEDICATED_PATCH | Freq: Every day | TRANSDERMAL | Status: DC | PRN
Start: 2022-08-28 — End: 2022-08-29

## 2022-08-28 MED ORDER — VANCOMYCIN HCL IN DEXTROSE 1-5 GM/200ML-% IV SOLN: 1000.0000 mg | INTRAVENOUS | Status: DC

## 2022-08-28 MED ORDER — DEXTROMETHORPHAN POLISTIREX ER 30 MG/5ML PO SUER
30.0000 mg | Freq: Two times a day (BID) | ORAL | Status: DC | PRN
Start: 2022-08-28 — End: 2022-09-01

## 2022-08-28 MED ORDER — ACETAMINOPHEN 650 MG RE SUPP: 650.0000 mg | Freq: Four times a day (QID) | RECTAL | Status: DC | PRN

## 2022-08-28 MED ORDER — IPRATROPIUM-ALBUTEROL 0.5-2.5 (3) MG/3ML IN SOLN
3.0000 mL | Freq: Once | RESPIRATORY_TRACT | Status: AC
  Administered 2022-08-28: 3 mL via RESPIRATORY_TRACT
  Filled 2022-08-28: qty 3

## 2022-08-28 MED ORDER — MAGNESIUM SULFATE 2 GM/50ML IV SOLN: 2.0000 g | Freq: Once | INTRAVENOUS | Status: DC

## 2022-08-28 MED ORDER — LACTATED RINGERS IV BOLUS (SEPSIS)
1000.0000 mL | Freq: Once | INTRAVENOUS | Status: AC
Start: 2022-08-28 — End: 2022-08-28
  Administered 2022-08-28: 1000 mL via INTRAVENOUS

## 2022-08-28 MED ORDER — ASPIRIN 81 MG PO CHEW
81.0000 mg | CHEWABLE_TABLET | Freq: Every day | ORAL | Status: DC
  Administered 2022-08-28 – 2022-08-31 (×4): 81 mg via ORAL
  Filled 2022-08-28 (×4): qty 1

## 2022-08-28 NOTE — Progress Notes (Signed)
When I found a finger that was not as cold as the others and placed pulse ox on it, reading was 100% on 3L.  Took patient back down to 2L after treatment, reading still 100%.

## 2022-08-28 NOTE — Progress Notes (Signed)
Pharmacy Antibiotic Note  Patrick Beck is a 76 y.o. male admitted on 08/28/2022 with pneumonia.  Pharmacy has been consulted for vancomycin/cefepime dosing.  Plan: Vancomycin 1000 mg IV every 48 hours. Cefepime 2000 mg IV every 24 hours. Monitor labs, c/s, and vanco level as indicated.  Height: 6' (182.9 cm) Weight: 56.7 kg (125 lb) IBW/kg (Calculated) : 77.6  Temp (24hrs), Avg:98 F (36.7 C), Min:98 F (36.7 C), Max:98 F (36.7 C)  Recent Labs  Lab 08/28/22 1304  WBC 11.5*  CREATININE 1.75*  LATICACIDVEN 4.7*    Estimated Creatinine Clearance: 29.7 mL/min (A) (by C-G formula based on SCr of 1.75 mg/dL (H)).    Allergies  Allergen Reactions   Macadamia Nut Oil Anaphylaxis    Throat swelling and can't breathe    Antimicrobials this admission: Vanco 8/30 >> Cefepime 8/30 >>   Microbiology results: 8/30 BCx: pending 8/30 MRSA PCR: pending  Thank you for allowing pharmacy to be a part of this patient's care.  Ramond Craver 08/28/2022 2:54 PM

## 2022-08-28 NOTE — Sepsis Progress Note (Signed)
Lactic acid 4.7 at 1304 and 4.8 at 1447. Pt has had one liter LR and cefepime. Notified bedside nurse of need to administer additional fluid boluses ordered and vancomycin ordered. Then repeat lactic acid after those interventions ar completed.

## 2022-08-28 NOTE — Hospital Course (Signed)
76 year old gentleman with type 2 diabetes mellitus, GERD, aortic stenosis status post TAVR, coronary artery disease, COPD, chronic tobacco use, peripheral neuropathy, PAD, glaucoma, hypertension, diverticulosis and ED presented to the emergency department after being sent by PCP office when he presented today with hypoxia.  His pulse ox was down to 74% and he appeared pale and lethargic.  He had presented for a hospital follow-up.  He had been hospitalized here approximately 3 weeks ago and treated for cellulitis.  He reports that he has been dealing with chronic diarrhea condition for approximately 1 month.  He says that he recently started smoking again after being discharged from the SNF facility.  He reports that he is having difficulty ambulating.  He also reports productive cough with yellow sputum.  He denies fever and chills.  He was noted to be hypotensive, with an AKI and afebrile.  He was started on sepsis protocol.  His chest x-ray confirmed findings of pneumonia.  He was also noted to have electrolyte abnormalities including hypoglycemia, hypocalcemia and severe hypomagnesemia.  He has been started on treatment for severe dehydration, sepsis pneumonia and electrolyte abnormalities.

## 2022-08-28 NOTE — H&P (Signed)
History and Physical  [facility]  TROI FLORENDO CNO:[id number] DOB: 11-06-1946 DOA: 08/28/2022  PCP: Lemmie Evens, MD  Patient coming from: PCP office  Level of care: Telemetry  I have personally briefly reviewed patient's old medical records in [place]  Chief Complaint: sent from PCP office with hypoxia   HPI: Patrick Beck is a 76 year old gentleman with type 2 diabetes mellitus, GERD, aortic stenosis status post TAVR, coronary artery disease, COPD, chronic tobacco use, peripheral neuropathy, PAD, glaucoma, hypertension, diverticulosis and ED presented to the emergency department after being sent by PCP office when he presented today with hypoxia.  His pulse ox was down to 74% and he appeared pale and lethargic.  He had presented for a hospital follow-up.  He had been hospitalized here approximately 3 weeks ago and treated for cellulitis.  He reports that he has been dealing with chronic diarrhea condition for approximately 1 month.  He says that he recently started smoking again after being discharged from the SNF facility.  He reports that he is having difficulty ambulating.  He also reports productive cough with yellow sputum.  He denies fever and chills.  He was noted to be hypotensive, with an AKI and afebrile.  He was started on sepsis protocol.  His chest x-ray confirmed findings of pneumonia.  He was also noted to have electrolyte abnormalities including hypoglycemia, hypocalcemia and severe hypomagnesemia.  He has been started on treatment for severe dehydration, sepsis pneumonia and electrolyte abnormalities.  Review of Systems: Review of Systems  Constitutional:  Positive for malaise/fatigue and weight loss. Negative for chills and fever.  HENT:  Positive for congestion. Negative for sinus pain and sore throat.   Eyes: Negative.   Respiratory:  Positive for cough, sputum production, shortness of breath and wheezing. Negative for stridor.   Cardiovascular:  Positive for  leg swelling. Negative for chest pain, palpitations and claudication.  Gastrointestinal:  Positive for diarrhea and nausea. Negative for abdominal pain and vomiting.  Genitourinary: Negative.   Musculoskeletal: Negative.   Skin: Negative.   Neurological:  Positive for dizziness and weakness.  Endo/Heme/Allergies: Negative.   Psychiatric/Behavioral: Negative.       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 Angelena Form & Dr. Cyndia Bent   Severe aortic stenosis    Type 2 diabetes mellitus [facility])     Past Surgical History:  Procedure Laterality Date   BIOPSY  07/03/2022   Procedure: BIOPSY;  Surgeon: Eloise Harman, DO;  [facility];  Service: Endoscopy;;   CARDIAC CATHETERIZATION     COLONOSCOPY N/A 04/06/2013   Procedure: COLONOSCOPY;  Surgeon: Jamesetta So, MD;  [facility];  Service: Gastroenterology;  Laterality: N/A;   ESOPHAGOGASTRODUODENOSCOPY (EGD) WITH PROPOFOL N/A 07/03/2022   Procedure: ESOPHAGOGASTRODUODENOSCOPY (EGD) WITH PROPOFOL;  Surgeon: Eloise Harman, DO;  [facility];  Service: Endoscopy;  Laterality: N/A;  1:00pm, moved to 7/5 @ 9:45   EYE SURGERY Left    laser sx fr glaucoma   HERNIA REPAIR     Three prior surgeries before 1966   MULTIPLE EXTRACTIONS WITH ALVEOLOPLASTY N/A 05/24/2021   Procedure: EXTRACTION OF TEETH NUMBER TWO, THREE, FIVE, SIX, SEVEN,  EIGHT, NINE, TEN, ELEVEN, TWELVE, SEVENTEEN, EIGHTTEEN, NINETEEN, TWENTY, TWENTY-ONE, TWENTY- TWO, TWENTY-FOUR, THWENTY-SEVEN, TWENTY-EIGHT, TWENTY-NINE WITH ALVEOLOPLASTY IN ALL FOUR QUADRANTS;  Surgeon: [name], DMD;  Location: [place];  Service: Dentistry;  Laterality: N/A;   RIGHT/LEFT HEART CATH AND CORONARY ANGIOGRAPHY N/A 04/18/2021   Procedure: RIGHT/LEFT HEART CATH AND CORONARY ANGIOGRAPHY;  Surgeon: Burnell Blanks, MD;  Location: [place] [facility];  Service: Cardiovascular;  Laterality: N/A;   TEE WITHOUT CARDIOVERSION N/A 06/26/2021   Procedure: TRANSESOPHAGEAL ECHOCARDIOGRAM (TEE);  Surgeon: Burnell Blanks, MD;  Location: [place];  Service: Open Heart Surgery;  Laterality: N/A;   TONSILLECTOMY     at age 29     reports that he has been smoking cigarettes. He has a 42.00 pack-year smoking history. He has never used smokeless tobacco. He reports that he does not drink alcohol and does not use drugs.  Allergies  Allergen Reactions   Macadamia Nut Oil Anaphylaxis    Throat swelling and can't breathe    Family History  Problem Relation Age of Onset   Aortic stenosis Father    COPD Mother    Cancer Neg Hx     Prior to Admission medications   Medication Sig Start Date End Date Taking? Authorizing Provider  acetaminophen (TYLENOL) 325 MG tablet Take 2 tablets (650 mg total) by mouth every 6 (six) hours as needed for mild pain (or Fever >/= 101). 08/03/22  Yes Giovanna Kemmerer L, MD  aspirin 81 MG chewable tablet Chew 1 tablet (81 mg total) by mouth daily. Can swallow whole 06/28/21  Yes Eileen Stanford, PA-C  brimonidine (ALPHAGAN) 0.2 % ophthalmic solution Place 1 drop into both eyes 3 (three) times daily. 05/23/20  Yes [provider]  furosemide (LASIX) 40 MG tablet Take 40 mg by mouth in the morning.   Yes [provider]  glimepiride (AMARYL) 1 MG tablet Take 0.5 mg by mouth in the morning. 05/13/22  Yes [provider]   hydrochlorothiazide (HYDRODIURIL) 12.5 MG tablet Take 12.5 mg by mouth daily. 08/16/22  Yes [provider]  ibuprofen (ADVIL) 200 MG tablet Take 1 tablet (200 mg total) by mouth every 8 (eight) hours as needed for fever, headache, mild pain or cramping (pain.). 08/03/22  Yes Montana Bryngelson L, MD  latanoprost (XALATAN) 0.005 % ophthalmic solution Place 1 drop into both eyes at bedtime. 10/14/17  Yes [provider]  loperamide (IMODIUM) 2 MG capsule Take 1 capsule (2 mg total) by mouth as needed for diarrhea or loose stools. 08/03/22  Yes Jader Desai L, MD  Melatonin 10 MG CAPS Take 20-30 mg by mouth at bedtime as needed (sleep).   Yes [provider]  metFORMIN (GLUCOPHAGE) 1000 MG tablet Take 1,000 mg by mouth 2 (two) times daily with a meal.   Yes [provider]  Multiple Vitamin (MULTIVITAMIN WITH MINERALS) TABS tablet Take 1 tablet by mouth daily with supper.   Yes [provider]  pantoprazole (PROTONIX) 40 MG tablet TAKE 1 TABLET BY MOUTH EVERY DAY Patient taking differently: Take 40 mg by mouth  daily. 03/29/22  Yes Eileen Stanford, PA-C  pravastatin (PRAVACHOL) 40 MG tablet Take 40 mg by mouth every evening.   Yes [provider]  spironolactone (ALDACTONE) 25 MG tablet Take 12.5 mg by mouth in the morning.   Yes [provider]  timolol (TIMOPTIC) 0.5 % ophthalmic solution Place 1 drop into both eyes every evening. 08/16/21  Yes [provider]  magnesium oxide (MAG-OX) 400 (240 Mg) MG tablet Take 400 mg by mouth in the morning.    [provider]  saccharomyces boulardii (FLORASTOR) 250 MG capsule Take 1 capsule (250 mg total) by mouth 2 (two) times daily. 08/03/22 09/02/22  Murlean Iba, MD   Physical Exam: Vitals:   08/28/22 1233 08/28/22 1236 08/28/22 1300 08/28/22 1430  BP:  (!) '85/62 94/71 90/62 '$  Pulse:  (!) 110 (!) 102 100  Resp:  (!) '25 19 20  '$ Temp:  98 F (36.7 C)    TempSrc:  Oral     SpO2: (!) 75% 99% (!) 68% 92%  Weight: 56.7 kg     Height: 6' (1.829 m)       Constitutional: frail, pale, chronically ill appearing male, he is awake, alert, cooperative, NAD, calm, comfortable, he is on his home oxygen 2L/min Eyes: PERRL, lids and conjunctivae normal ENMT: Mucous membranes are pale and dry. Posterior pharynx clear of any exudate or lesions.Normal dentition.  Neck: normal, supple, no masses, no thyromegaly Respiratory: diminished BS RLL. Normal respiratory effort. No accessory muscle use.  Cardiovascular: normal s1, s2 sounds, no murmurs / rubs / gallops. 1+ extremity edema. 2+ pedal pulses. No carotid bruits.  Abdomen: no tenderness, no masses palpated. No hepatosplenomegaly. Bowel sounds positive.  Musculoskeletal: no clubbing / cyanosis. No joint deformity upper and lower extremities. Good ROM, no contractures. Normal muscle tone.  Skin: no rashes, lesions, ulcers. No induration Neurologic: CN 2-12 grossly intact. Sensation intact, DTR normal. Strength 5/5 in all 4.  Psychiatric: Normal judgment and insight. Alert and oriented x 3. Normal mood.   Labs on Admission: I have personally reviewed following labs and imaging studies  CBC: Recent Labs  Lab 08/28/22 1304  WBC 11.5*  NEUTROABS 9.5*  HGB 11.6*  HCT 35.7*  MCV 99.7  PLT 094   Basic Metabolic Panel: Recent Labs  Lab 08/28/22 1304  NA 136  K 4.1  CL 103  CO2 21*  GLUCOSE 57*  BUN 49*  CREATININE 1.75*  CALCIUM 5.8*  MG 0.9*   GFR: Estimated Creatinine Clearance: 29.7 mL/min (A) (by C-G formula based on SCr of 1.75 mg/dL (H)). Liver Function Tests: Recent Labs  Lab 08/28/22 1304  AST 23  ALT 21  ALKPHOS 144*  BILITOT 0.6  PROT 4.3*  ALBUMIN 1.9*   No results for input(s): "LIPASE", "AMYLASE" in the last 168 hours. No results for input(s): "AMMONIA" in the last 168 hours. Coagulation Profile: Recent Labs  Lab 08/28/22 1328  INR 1.3*   Cardiac Enzymes: No results for input(s):  "CKTOTAL", "CKMB", "CKMBINDEX", "TROPONINI" in the last 168 hours. BNP (last 3 results) No results for input(s): "PROBNP" in the last 8760 hours. HbA1C: No results for input(s): "HGBA1C" in the last 72 hours. CBG: No results for input(s): "GLUCAP" in the last 168 hours. Lipid Profile: No results for input(s): "CHOL", "HDL", "LDLCALC", "TRIG", "CHOLHDL", "LDLDIRECT" in the last 72 hours. Thyroid Function Tests: No results for input(s): "TSH", "T4TOTAL", "FREET4", "T3FREE", "THYROIDAB" in the last 72 hours. Anemia Panel: No results for input(s): "VITAMINB12", "  FOLATE", "FERRITIN", "TIBC", "IRON", "RETICCTPCT" in the last 72 hours. Urine analysis:    Component Value Date/Time   COLORURINE YELLOW 07/29/2022 1803   APPEARANCEUR CLEAR 07/29/2022 1803   LABSPEC 1.010 07/29/2022 1803   PHURINE 5.0 07/29/2022 1803   GLUCOSEU NEGATIVE 07/29/2022 1803   HGBUR NEGATIVE 07/29/2022 1803   BILIRUBINUR NEGATIVE 07/29/2022 1803   KETONESUR NEGATIVE 07/29/2022 1803   PROTEINUR NEGATIVE 07/29/2022 1803   NITRITE NEGATIVE 07/29/2022 1803   LEUKOCYTESUR NEGATIVE 07/29/2022 1803    Radiological Exams on Admission: DG Chest Port 1 View  Result Date: 08/28/2022 CLINICAL DATA:  Provided history: Questionable sepsis-evaluate for abnormality. Additional history provided: Shortness of breath, diarrhea. History of CHF, COPD, pneumonia. Current smoker. EXAM: PORTABLE CHEST 1 VIEW COMPARISON:  Prior chest radiographs 07/29/2022 and earlier. Chest CT 04/27/2021. FINDINGS: Heart size within normal limits. Prior TAVR. Aortic atherosclerosis. Multifocal opacity within the left mid and lower lung fields which may reflect atelectasis and/or pneumonia. Superimposed scarring within the left mid lung field. No evidence of pleural effusion or pneumothorax. No acute bony abnormality identified. Dextrocurvature of the thoracic spine. IMPRESSION: Multifocal opacity within the left mid and lower lung fields which may reflect  atelectasis and/or pneumonia. Radiographic follow-up to resolution recommended to exclude underlying malignancy. Redemonstrated foci of scarring within the left mid lung field. Aortic Atherosclerosis (ICD10-I70.0). Electronically Signed   By: Kellie Simmering D.O.   On: 08/28/2022 13:29    Assessment/Plan Principal Problem:   Sepsis due to pneumonia [facility]) Active Problems:   COPD (chronic obstructive pulmonary disease) (HCC)   Tobacco dependence   Type 2 diabetes mellitus without complication (HCC)   Essential hypertension   Glaucoma   Severe aortic stenosis   PAD (peripheral artery disease) (HCC)   S/P TAVR (transcatheter aortic valve replacement)   Severe sepsis (HCC)   Chronic diastolic CHF (congestive heart failure) (HCC)   Mixed hyperlipidemia   AKI (acute kidney injury) (HCC)   Dehydration   Hypoalbuminemia due to protein-calorie malnutrition (HCC)   Lactic acidosis   GERD (gastroesophageal reflux disease)   Pressure injury of skin   Hypocalcemia   Hypomagnesemia   Leukocytosis    Severe sepsis due to pneumonia - pt presenting with findings on CT scan in [place] consistent with aspiration  - continue IV antibiotics and supportive measures - SLP evaluation for swallow function  - supplemental oxygen as needed  - continue sepsis protocol as ordered - treating and trending lactic acid  - aspiration precautions ordered   AKI and dehydration  - prerenal from frequent diarrhea and ongoing use of diuretics - stop diuretics - IV fluid hydration - follow renal function   Hypoglycemia  - 1 amp D50 given  - Feed patient  - follow CBG closely  - sensitive SSI coverage only   Hypocalcemia  - IV replacement given - recheck in AM   Hypomagnesemia  - IV replacement ordered - recheck in AM   Leukocytosis  - follow CBC and treating pneumonia and sepsis aggressively   Glaucoma  - resume home medication   Tobacco  - Pt declines nicotine patch at this time - he had resumed  smoking since DC from SNF recently  Generalized weakness and failure to thrive  - significant other reports patient can barely walk at home and severely deconditioned  - he likely will need LTC placement as he was just released from SNF - he obviously has failure to thrive despite maximal medical treatments and will appropriately ask for a palliative  care consultation  - PT evaluation requested  - fall precautions   Type 2 DM with neurological complications - sensitive SSI coverage with frequent CBG monitoring ordered   Chronic diarrhea - unknown cause - check stool for C diff - GI pathogen panel ordered - IV fluid rehydration  - replacing electrolytes - if persists, would ask for GI eval given >1 month of symptoms   GERD  - protonix ordered for GI protection   DVT prophylaxis: Edwards heparin  Code Status: Full   Family Communication: significant other at bedside   Disposition Plan: TBD   Consults called:   Admission status: INP   Level of care: Telemetry Irwin Brakeman MD [facility] How to contact the [facility] Attending or Consulting provider [place] or covering provider during after hours [place], for this patient?  Check the care team in [facility] and look for a) attending/consulting [facility] provider listed and b) the [facility] team listed Log into [email or web address] and use [place]'s universal password to access. If you do not have the password, please contact the hospital operator. Locate the [facility] provider you are looking for under [facility] and page to a number that you can be directly reached. If you still have difficulty reaching the provider, please page the [facility] (Director on Call) for the Hospitalists listed on amion for assistance.   If 7PM-7AM, please contact night-coverage [email or web address] Password [email or web address]  08/28/2022, 3:19 PM

## 2022-08-28 NOTE — ED Provider Notes (Signed)
[facility] EMERGENCY DEPARTMENT Provider Note   CSN: [id number] Arrival date & time: 08/28/22  1228     History  Chief Complaint  Patient presents with   Shortness of Breath    Patrick Beck is a 76 y.o. male.   Shortness of Breath Patient presents for shortness of breath.  Medical history includes COPD, T2DM, HTN, aortic stenosis s/p TAVR, peripheral artery disease, GERD, CHF, anxiety, depression, and recent hospitalization for sepsis 4 weeks ago.  Presumed source of infection was cellulitis of left arm and left foot.  He was treated with broad-spectrum antibiotics.  He was discharged 3 weeks ago.  He returned from rehab to home 5 days ago.  Since his return home, he has had worsening fatigue and generalized weakness.  Today, he had a follow-up appointment with his primary care doctor.  At his PCPs office, he was found to be hypotensive and hypoxic on his home oxygen.  He was sent to the ED.  At baseline, he wears 2 L of supplemental oxygen.  Although patient endorses shortness of breath earlier today, he currently feels that his breathing is at baseline.  He states that he did cough up a large piece of phlegm prior to arrival in the ED.  He does continue to feel fatigue and generalized weakness.  He endorses persistent diarrhea over the past month.  He denies any other recent symptoms.     Home Medications Prior to Admission medications   Medication Sig Start Date End Date Taking? Authorizing Provider  acetaminophen (TYLENOL) 325 MG tablet Take 2 tablets (650 mg total) by mouth every 6 (six) hours as needed for mild pain (or Fever >/= 101). 08/03/22  Yes Johnson, Clanford L, MD  aspirin 81 MG chewable tablet Chew 1 tablet (81 mg total) by mouth daily. Can swallow whole 06/28/21  Yes Eileen Stanford, PA-C  brimonidine (ALPHAGAN) 0.2 % ophthalmic solution Place 1 drop into both eyes 3 (three) times daily. 05/23/20  Yes [provider]  furosemide (LASIX) 40 MG tablet Take 40 mg  by mouth in the morning.   Yes [provider]  glimepiride (AMARYL) 1 MG tablet Take 0.5 mg by mouth in the morning. 05/13/22  Yes [provider]  hydrochlorothiazide (HYDRODIURIL) 12.5 MG tablet Take 12.5 mg by mouth daily. 08/16/22  Yes [provider]  ibuprofen (ADVIL) 200 MG tablet Take 1 tablet (200 mg total) by mouth every 8 (eight) hours as needed for fever, headache, mild pain or cramping (pain.). 08/03/22  Yes Johnson, Clanford L, MD  latanoprost (XALATAN) 0.005 % ophthalmic solution Place 1 drop into both eyes at bedtime. 10/14/17  Yes [provider]  loperamide (IMODIUM) 2 MG capsule Take 1 capsule (2 mg total) by mouth as needed for diarrhea or loose stools. 08/03/22  Yes Johnson, Clanford L, MD  Melatonin 10 MG CAPS Take 20-30 mg by mouth at bedtime as needed (sleep).   Yes [provider]  metFORMIN (GLUCOPHAGE) 1000 MG tablet Take 1,000 mg by mouth 2 (two) times daily with a meal.   Yes [provider]  Multiple Vitamin (MULTIVITAMIN WITH MINERALS) TABS tablet Take 1 tablet by mouth daily with supper.   Yes [provider]  pantoprazole (PROTONIX) 40 MG tablet TAKE 1 TABLET BY MOUTH EVERY DAY Patient taking differently: Take 40 mg by mouth daily. 03/29/22  Yes Eileen Stanford, PA-C  pravastatin (PRAVACHOL) 40 MG tablet Take 40 mg by mouth every evening.   Yes [provider]  spironolactone (ALDACTONE) 25 MG tablet Take 12.5 mg by mouth in the morning.   Yes [provider]  timolol (TIMOPTIC) 0.5 % ophthalmic solution Place 1 drop into both eyes every evening. 08/16/21  Yes [provider]  magnesium oxide (MAG-OX) 400 (240 Mg) MG tablet Take 400 mg by mouth in the morning.    [provider]  saccharomyces boulardii (FLORASTOR) 250 MG capsule Take 1 capsule (250 mg total) by mouth 2 (two) times daily. 08/03/22 09/02/22  Murlean Iba, MD      Allergies    Macadamia nut oil     Review of Systems   Review of Systems  Constitutional:  Positive for fatigue.  Respiratory:  Positive for shortness of breath.   Gastrointestinal:  Positive for diarrhea.  Neurological:  Positive for weakness (Generalized).  All other systems reviewed and are negative.   Physical Exam Updated Vital Signs BP 101/60 (BP Location: Right Arm)   Pulse 79   Temp 98.2 F (36.8 C)   Resp 20   Ht 6' (1.829 m)   Wt 59.6 kg   SpO2 100%   BMI 17.82 kg/m  Physical Exam Vitals and nursing note reviewed.  Constitutional:      General: He is not in acute distress.    Appearance: He is well-developed. He is cachectic. He is ill-appearing (Chronically). He is not toxic-appearing or diaphoretic.  HENT:     Head: Normocephalic and atraumatic.     Mouth/Throat:     Mouth: Mucous membranes are moist.     Pharynx: Oropharynx is clear.  Eyes:     Conjunctiva/sclera: Conjunctivae normal.  Cardiovascular:     Rate and Rhythm: Tachycardia present. Rhythm irregular.     Heart sounds: No murmur heard. Pulmonary:     Effort: Pulmonary effort is normal. No respiratory distress.     Breath sounds: Decreased breath sounds present. No wheezing or rhonchi.  Abdominal:     Palpations: Abdomen is soft.     Tenderness: There is no abdominal tenderness.  Musculoskeletal:        General: No swelling.     Cervical back: Normal range of motion and neck supple.     Right lower leg: No edema.     Left lower leg: No edema.  Skin:    General: Skin is warm and dry.     Coloration: Skin is not cyanotic or pale.     Findings: Erythema (L arm) present.  Neurological:     General: No focal deficit present.     Mental Status: He is alert and oriented to person, place, and time.  Psychiatric:        Mood and Affect: Mood normal.        Behavior: Behavior normal.     ED Results / Procedures / Treatments   Labs (all labs ordered are listed, but only abnormal results are displayed) Labs Reviewed  LACTIC  ACID, PLASMA - Abnormal; Notable for the following components:      Result Value   Lactic Acid, Venous 4.7 (*)    All other components within normal limits  LACTIC ACID, PLASMA - Abnormal; Notable for the following components:   Lactic Acid, Venous 4.8 (*)    All other components within normal limits  COMPREHENSIVE METABOLIC PANEL - Abnormal; Notable for the following components:   CO2 21 (*)    Glucose, Bld 57 (*)    BUN 49 (*)    Creatinine, Ser 1.75 (*)  Calcium 5.8 (*)    Total Protein 4.3 (*)    Albumin 1.9 (*)    Alkaline Phosphatase 144 (*)    GFR, Estimated 40 (*)    All other components within normal limits  CBC WITH DIFFERENTIAL/PLATELET - Abnormal; Notable for the following components:   WBC 11.5 (*)    RBC 3.58 (*)    Hemoglobin 11.6 (*)    HCT 35.7 (*)    Neutro Abs 9.5 (*)    All other components within normal limits  MAGNESIUM - Abnormal; Notable for the following components:   Magnesium 0.9 (*)    All other components within normal limits  PROTIME-INR - Abnormal; Notable for the following components:   Prothrombin Time 16.2 (*)    INR 1.3 (*)    All other components within normal limits  LACTIC ACID, PLASMA - Abnormal; Notable for the following components:   Lactic Acid, Venous 4.0 (*)    All other components within normal limits  LACTIC ACID, PLASMA - Abnormal; Notable for the following components:   Lactic Acid, Venous 6.1 (*)    All other components within normal limits  COMPREHENSIVE METABOLIC PANEL - Abnormal; Notable for the following components:   Glucose, Bld 230 (*)    BUN 46 (*)    Creatinine, Ser 1.56 (*)    Calcium 6.1 (*)    Total Protein 3.6 (*)    Albumin 1.5 (*)    GFR, Estimated 46 (*)    All other components within normal limits  MAGNESIUM - Abnormal; Notable for the following components:   Magnesium 1.5 (*)    All other components within normal limits  CBC - Abnormal; Notable for the following components:   RBC 3.02 (*)     Hemoglobin 9.7 (*)    HCT 30.2 (*)    All other components within normal limits  GLUCOSE, CAPILLARY - Abnormal; Notable for the following components:   Glucose-Capillary 350 (*)    All other components within normal limits  GLUCOSE, CAPILLARY - Abnormal; Notable for the following components:   Glucose-Capillary 233 (*)    All other components within normal limits  LACTIC ACID, PLASMA - Abnormal; Notable for the following components:   Lactic Acid, Venous 2.1 (*)    All other components within normal limits  GLUCOSE, CAPILLARY - Abnormal; Notable for the following components:   Glucose-Capillary 168 (*)    All other components within normal limits  GLUCOSE, CAPILLARY - Abnormal; Notable for the following components:   Glucose-Capillary 139 (*)    All other components within normal limits  COMPREHENSIVE METABOLIC PANEL - Abnormal; Notable for the following components:   BUN 45 (*)    Creatinine, Ser 1.27 (*)    Calcium 6.4 (*)    Total Protein 3.4 (*)    Albumin <1.5 (*)    AST 14 (*)    GFR, Estimated 59 (*)    All other components within normal limits  MAGNESIUM - Abnormal; Notable for the following components:   Magnesium 1.4 (*)    All other components within normal limits  CBC - Abnormal; Notable for the following components:   RBC 2.97 (*)    Hemoglobin 9.6 (*)    HCT 30.3 (*)    MCV 102.0 (*)    All other components within normal limits  GLUCOSE, CAPILLARY - Abnormal; Notable for the following components:   Glucose-Capillary 223 (*)    All other components within normal limits  GLUCOSE, CAPILLARY - Abnormal;  Notable for the following components:   Glucose-Capillary 206 (*)    All other components within normal limits  GLUCOSE, CAPILLARY - Abnormal; Notable for the following components:   Glucose-Capillary 136 (*)    All other components within normal limits  GLUCOSE, CAPILLARY - Abnormal; Notable for the following components:   Glucose-Capillary 190 (*)    All  other components within normal limits  CBG MONITORING, ED - Abnormal; Notable for the following components:   Glucose-Capillary 39 (*)    All other components within normal limits  CBG MONITORING, ED - Abnormal; Notable for the following components:   Glucose-Capillary 129 (*)    All other components within normal limits  CULTURE, BLOOD (ROUTINE X 2)  CULTURE, BLOOD (ROUTINE X 2)  MRSA NEXT GEN BY PCR, NASAL  GASTROINTESTINAL PANEL BY PCR, STOOL (REPLACES STOOL CULTURE)  BRAIN NATRIURETIC PEPTIDE  TSH  PHOSPHORUS  GLUCOSE, CAPILLARY  GLUCOSE, CAPILLARY  URINALYSIS, ROUTINE W REFLEX MICROSCOPIC  TROPONIN I (HIGH SENSITIVITY)  TROPONIN I (HIGH SENSITIVITY)    EKG EKG Interpretation  Date/Time:  Wednesday August 28 2022 12:43:05 EDT Ventricular Rate:  106 PR Interval:  136 QRS Duration: 85 QT Interval:  357 QTC Calculation: 475 R Axis:   -16 Text Interpretation: Ectopic atrial tachycardia, unifocal Borderline left axis deviation Anterior infarct, old Confirmed by Nanda Quinton [id number]) on 08/29/2022 2:25:42 PM  Radiology No results found.  Procedures Procedures    Medications Ordered in ED Medications  lactated ringers infusion (0 mLs Intravenous Stopped 08/29/22 1930)  aspirin chewable tablet 81 mg (81 mg Oral Given 08/30/22 0828)  pravastatin (PRAVACHOL) tablet 40 mg (40 mg Oral Given 08/30/22 1705)  pantoprazole (PROTONIX) EC tablet 40 mg (40 mg Oral Given 08/30/22 0828)  saccharomyces boulardii (FLORASTOR) capsule 250 mg (250 mg Oral Given 08/30/22 0828)  multivitamin with minerals tablet 1 tablet (1 tablet Oral Given 08/30/22 1705)  brimonidine (ALPHAGAN) 0.2 % ophthalmic solution 1 drop (1 drop Both Eyes Given 08/30/22 1707)  latanoprost (XALATAN) 0.005 % ophthalmic solution 1 drop (1 drop Both Eyes Given 08/29/22 2203)  timolol (TIMOPTIC) 0.5 % ophthalmic solution 1 drop (1 drop Both Eyes Given 08/29/22 2203)  insulin aspart (novoLOG) injection 0-9 Units (2 Units Subcutaneous  Given 08/30/22 1706)  heparin injection 5,000 Units (5,000 Units Subcutaneous Given 08/30/22 1412)  acetaminophen (TYLENOL) tablet 650 mg (650 mg Oral Given 08/30/22 0828)    Or  acetaminophen (TYLENOL) suppository 650 mg ( Rectal See Alternative 08/30/22 6734)  oxyCODONE (Oxy IR/ROXICODONE) immediate release tablet 5 mg (has no administration in time range)  traZODone (DESYREL) tablet 25 mg (has no administration in time range)  ondansetron (ZOFRAN) tablet 4 mg (has no administration in time range)    Or  ondansetron (ZOFRAN) injection 4 mg (has no administration in time range)  guaiFENesin (MUCINEX) 12 hr tablet 1,200 mg (1,200 mg Oral Given 08/30/22 0828)  dextromethorphan (DELSYM) 30 MG/5ML liquid 30 mg (has no administration in time range)  feeding supplement (ENSURE ENLIVE / ENSURE PLUS) liquid 237 mL (237 mLs Oral Patient Refused/Not Given 08/30/22 1351)  nicotine (NICODERM CQ - dosed in mg/24 hours) patch 14 mg (has no administration in time range)  ceFEPIme (MAXIPIME) 2 g in sodium chloride 0.9 % 100 mL IVPB (2 g Intravenous New Bag/Given 08/30/22 1116)  sertraline (ZOLOFT) tablet 25 mg (25 mg Oral Given 08/30/22 0828)  loperamide (IMODIUM) capsule 2 mg (has no administration in time range)  ipratropium-albuterol (DUONEB) 0.5-2.5 (3) MG/3ML nebulizer solution 3 mL (  3 mLs Nebulization Given 08/30/22 1312)  albuterol (PROVENTIL) (2.5 MG/3ML) 0.083% nebulizer solution 2.5 mg (has no administration in time range)  calcium carbonate (TUMS - dosed in mg elemental calcium) chewable tablet 200 mg of elemental calcium (200 mg of elemental calcium Oral Given 08/30/22 1705)  lactated ringers bolus 1,000 mL (0 mLs Intravenous Stopped 08/28/22 1354)  ipratropium-albuterol (DUONEB) 0.5-2.5 (3) MG/3ML nebulizer solution 3 mL (3 mLs Nebulization Given 08/28/22 1324)  lactated ringers bolus 500 mL (0 mLs Intravenous Stopped 08/28/22 2017)    And  lactated ringers bolus 250 mL (0 mLs Intravenous Stopped 08/28/22 2017)   vancomycin (VANCOCIN) IVPB 1000 mg/200 mL premix (0 mg Intravenous Stopped 08/28/22 1736)  ceFEPIme (MAXIPIME) 2 g in sodium chloride 0.9 % 100 mL IVPB (0 g Intravenous Stopped 08/28/22 1617)  magnesium sulfate IVPB 4 g 100 mL (0 g Intravenous Stopped 08/28/22 1814)  calcium gluconate 1 g/ 50 mL sodium chloride IVPB (0 mg Intravenous Stopped 08/28/22 1659)    Followed by  calcium gluconate 1 g/ 50 mL sodium chloride IVPB (0 mg Intravenous Stopped 08/28/22 1814)  dextrose 50 % solution 50 mL (50 mLs Intravenous Given 08/28/22 1618)  lactated ringers bolus 1,000 mL (0 mLs Intravenous Stopped 08/28/22 1659)  calcium gluconate 1 g/ 50 mL sodium chloride IVPB (0 mg Intravenous Stopped 08/29/22 0848)    Followed by  calcium gluconate 1 g/ 50 mL sodium chloride IVPB (0 mg Intravenous Stopped 08/29/22 1020)  magnesium sulfate IVPB 4 g 100 mL (0 g Intravenous Stopped 08/29/22 1313)  magnesium sulfate IVPB 4 g 100 mL (0 g Intravenous Stopped 08/30/22 1038)    ED Course/ Medical Decision Making/ A&P                           Medical Decision Making Amount and/or Complexity of Data Reviewed Labs: ordered. Radiology: ordered. ECG/medicine tests: ordered.  Risk Prescription drug management. Decision regarding hospitalization.   This patient presents to the ED for concern of generalized weakness, this involves an extensive number of treatment options, and is a complaint that carries with it a high risk of complications and morbidity.  The differential diagnosis includes infection, deconditioning, dehydration, neoplasm, malnutrition, metabolic derangements, anemia   Co morbidities that complicate the patient evaluation  COPD, T2DM, HTN, aortic stenosis s/p TAVR, peripheral artery disease, GERD, CHF, anxiety, depression   Additional history obtained:  Additional history obtained from patient's family External records from outside source obtained and reviewed including EMR   Lab Tests:  I Ordered,  and personally interpreted labs.  The pertinent results include: Patient has AKI, leukocytosis, and lactic acidosis.  Anemia is baseline.   Imaging Studies ordered:  I ordered imaging studies including chest x-ray, CT chest I independently visualized and interpreted imaging which showed left middle and lower lobe pneumonia I agree with the radiologist interpretation   Cardiac Monitoring: / EKG:  The patient was maintained on a cardiac monitor.  I personally viewed and interpreted the cardiac monitored which showed an underlying rhythm of: Sinus rhythm   Problem List / ED Course / Critical interventions / Medication management  Patient is a chronically ill 76 year old male presenting for generalized weakness over the past several days.  Additionally, at his primary care doctor's office today, he was found to be hypoxic.  Although he endorsed shortness of breath earlier today, he states that this has resolved upon arrival in the ED.  He does state that he was  able to cough up a large amount of phlegm.  Patient is able to speak in complete sentences and does not appear to be in respiratory distress.  He does appear to have normal SPO2 on his home dose of oxygen, however, SPO2 readings are limited due to poor peripheral perfusion.  His vital signs are notable for hypotension.  Bolus of IV fluids was ordered.  DuoNeb was ordered for wheezing which is present on lung auscultation.  Diagnostic work-up was initiated.  Patient's lab work is consistent with sepsis: Leukocytosis, lactic acidosis, and AKI.  Additional IV fluids were ordered in addition to broad-spectrum antibiotics.  Patient was admitted for further management. I ordered medication including IV fluids and broad-spectrum antibiotics for sepsis and AKI; DuoNeb for wheezing Reevaluation of the patient after these medicines showed that the patient improved I have reviewed the patients home medicines and have made adjustments as  needed   Social Determinants of Health:  Recent hospitalization   CRITICAL CARE Performed by: Godfrey Pick   Total critical care time: 35 minutes  Critical care time was exclusive of separately billable procedures and treating other patients.  Critical care was necessary to treat or prevent imminent or life-threatening deterioration.  Critical care was time spent personally by me on the following activities: development of treatment plan with patient and/or surrogate as well as nursing, discussions with consultants, evaluation of patient's response to treatment, examination of patient, obtaining history from patient or surrogate, ordering and performing treatments and interventions, ordering and review of laboratory studies, ordering and review of radiographic studies, pulse oximetry and re-evaluation of patient's condition.         Final Clinical Impression(s) / ED Diagnoses Final diagnoses:  HCAP (healthcare-associated pneumonia)  Sepsis with acute renal failure, due to unspecified organism, unspecified acute renal failure type, unspecified whether septic shock present [facility])    Rx / DC Orders ED Discharge Orders     None         Godfrey Pick, MD 08/30/22 1958

## 2022-08-28 NOTE — Sepsis Progress Note (Signed)
Code sepsis protocol being monitored by [employer]. 

## 2022-08-28 NOTE — ED Triage Notes (Addendum)
Pt c/o SOB. Pt came over to ED from Dr. Vickey Sages office. He was told his oxygen level was 75% on 2L via [place] and low BP. Pt wears 2L O2 at home. Pt also has complaints of diarrhea x 1 month.

## 2022-08-29 DIAGNOSIS — F32A Depression, unspecified: Secondary | ICD-10-CM

## 2022-08-29 DIAGNOSIS — R627 Adult failure to thrive: Secondary | ICD-10-CM | POA: Diagnosis present

## 2022-08-29 DIAGNOSIS — Z7189 Other specified counseling: Secondary | ICD-10-CM | POA: Diagnosis not present

## 2022-08-29 DIAGNOSIS — J189 Pneumonia, unspecified organism: Secondary | ICD-10-CM | POA: Diagnosis not present

## 2022-08-29 DIAGNOSIS — N179 Acute kidney failure, unspecified: Secondary | ICD-10-CM | POA: Diagnosis not present

## 2022-08-29 DIAGNOSIS — J449 Chronic obstructive pulmonary disease, unspecified: Secondary | ICD-10-CM

## 2022-08-29 DIAGNOSIS — F419 Anxiety disorder, unspecified: Secondary | ICD-10-CM | POA: Diagnosis not present

## 2022-08-29 DIAGNOSIS — Z515 Encounter for palliative care: Secondary | ICD-10-CM

## 2022-08-29 DIAGNOSIS — I5032 Chronic diastolic (congestive) heart failure: Secondary | ICD-10-CM | POA: Diagnosis not present

## 2022-08-29 LAB — MAGNESIUM: Magnesium: 1.5 mg/dL — ABNORMAL LOW (ref 1.7–2.4)

## 2022-08-29 LAB — CBC
HCT: 30.2 % — ABNORMAL LOW (ref 39.0–52.0)
Hemoglobin: 9.7 g/dL — ABNORMAL LOW (ref 13.0–17.0)
MCH: 32.1 pg (ref 26.0–34.0)
MCHC: 32.1 g/dL (ref 30.0–36.0)
MCV: 100 fL (ref 80.0–100.0)
Platelets: 174 10*3/uL (ref 150–400)
RBC: 3.02 MIL/uL — ABNORMAL LOW (ref 4.22–5.81)
RDW: 15.5 % (ref 11.5–15.5)
WBC: 10.2 10*3/uL (ref 4.0–10.5)
nRBC: 0 % (ref 0.0–0.2)

## 2022-08-29 LAB — COMPREHENSIVE METABOLIC PANEL
ALT: 17 U/L (ref 0–44)
AST: 15 U/L (ref 15–41)
Albumin: 1.5 g/dL — ABNORMAL LOW (ref 3.5–5.0)
Alkaline Phosphatase: 123 U/L (ref 38–126)
Anion gap: 6 (ref 5–15)
BUN: 46 mg/dL — ABNORMAL HIGH (ref 8–23)
CO2: 26 mmol/L (ref 22–32)
Calcium: 6.1 mg/dL — CL (ref 8.9–10.3)
Chloride: 103 mmol/L (ref 98–111)
Creatinine, Ser: 1.56 mg/dL — ABNORMAL HIGH (ref 0.61–1.24)
GFR, Estimated: 46 mL/min — ABNORMAL LOW (ref >60)
Glucose, Bld: 230 mg/dL — ABNORMAL HIGH (ref 70–99)
Potassium: 3.9 mmol/L (ref 3.5–5.1)
Sodium: 135 mmol/L (ref 135–145)
Total Bilirubin: 0.8 mg/dL (ref 0.3–1.2)
Total Protein: 3.6 g/dL — ABNORMAL LOW (ref 6.5–8.1)

## 2022-08-29 LAB — LACTIC ACID, PLASMA: Lactic Acid, Venous: 2.1 mmol/L (ref 0.5–1.9)

## 2022-08-29 LAB — PHOSPHORUS: Phosphorus: 4.4 mg/dL (ref 2.5–4.6)

## 2022-08-29 LAB — GLUCOSE, CAPILLARY
Glucose-Capillary: 233 mg/dL — ABNORMAL HIGH (ref 70–99)
Glucose-Capillary: 168 mg/dL — ABNORMAL HIGH (ref 70–99)
Glucose-Capillary: 139 mg/dL — ABNORMAL HIGH (ref 70–99)
Glucose-Capillary: 206 mg/dL — ABNORMAL HIGH (ref 70–99)
Glucose-Capillary: 223 mg/dL — ABNORMAL HIGH (ref 70–99)

## 2022-08-29 MED ORDER — LOPERAMIDE HCL 2 MG PO CAPS: 2.0000 mg | ORAL_CAPSULE | ORAL | Status: DC | PRN

## 2022-08-29 MED ORDER — CALCIUM GLUCONATE-NACL 1-0.675 GM/50ML-% IV SOLN
1.0000 g | Freq: Once | INTRAVENOUS | Status: AC
  Administered 2022-08-29: 1000 mg via INTRAVENOUS
  Filled 2022-08-29: qty 50

## 2022-08-29 MED ORDER — IPRATROPIUM-ALBUTEROL 0.5-2.5 (3) MG/3ML IN SOLN
3.0000 mL | Freq: Three times a day (TID) | RESPIRATORY_TRACT | Status: DC
  Administered 2022-08-30 – 2022-08-31 (×6): 3 mL via RESPIRATORY_TRACT
  Filled 2022-08-29 (×6): qty 3

## 2022-08-29 MED ORDER — NICOTINE 14 MG/24HR TD PT24: 14.0000 mg | MEDICATED_PATCH | Freq: Every day | TRANSDERMAL | Status: DC | PRN

## 2022-08-29 MED ORDER — SERTRALINE HCL 50 MG PO TABS
25.0000 mg | ORAL_TABLET | Freq: Every day | ORAL | Status: DC
  Administered 2022-08-29 – 2022-08-31 (×3): 25 mg via ORAL
  Filled 2022-08-29 (×3): qty 1

## 2022-08-29 MED ORDER — SODIUM CHLORIDE 0.9 % IV SOLN
2.0000 g | Freq: Two times a day (BID) | INTRAVENOUS | Status: DC
  Administered 2022-08-29 – 2022-08-31 (×5): 2 g via INTRAVENOUS
  Filled 2022-08-29 (×5): qty 12.5

## 2022-08-29 MED ORDER — ALBUTEROL SULFATE (2.5 MG/3ML) 0.083% IN NEBU: 2.5000 mg | INHALATION_SOLUTION | Freq: Four times a day (QID) | RESPIRATORY_TRACT | Status: DC | PRN

## 2022-08-29 MED ORDER — HYDROCOD POLI-CHLORPHE POLI ER 10-8 MG/5ML PO SUER
5.0000 mL | Freq: Two times a day (BID) | ORAL | Status: DC
  Administered 2022-08-29 – 2022-08-30 (×3): 5 mL via ORAL
  Filled 2022-08-29 (×3): qty 5

## 2022-08-29 MED ORDER — MAGNESIUM SULFATE 4 GM/100ML IV SOLN
4.0000 g | Freq: Once | INTRAVENOUS | Status: AC
  Administered 2022-08-29: 4 g via INTRAVENOUS
  Filled 2022-08-29: qty 100

## 2022-08-29 NOTE — Consult Note (Signed)
Consultation Note Date: 08/29/2022   Patient Name: Patrick Beck  DOB: Nov 11, 1946  MRN: [id number]  Age / Sex: 76 y.o., male  PCP: Lemmie Evens, MD Referring Physician: Murlean Iba, MD  Reason for Consultation: Goals of care   HPI/Patient Profile: 76 y.o. male  with past medical history of DM2, aortic stenosis status post TAVR, COPD, peripheral artery disease, glaucoma, depression, anxiety, aortic atherosclerosis, recent admission and treatment for cellulitis-was discharged to nursing facility and home only for a few weeks before now being readmitted, he has had chronic diarrhea since then admitted on 08/28/2022 with sepsis likely related to aspiration pneumonia, he also has acute kidney injury likely related to dehydration.  He is found to be greatly deconditioned with failure to thrive he is malnutrition his albumin is 1.5.  Palliative medicine consulted for goals of care.  Primary Decision Maker PATIENT  Discussion: I have reviewed medical records including Care Everywhere, progress notes from this and prior admissions, labs and imaging, discussed with RN.  On evaluation patient is at first very withdrawn and reluctant to talk with me.  He was having a significant coughing episode.  Eventually he did open up and we were able to have some goals of care discussion.  His girlfriend [name] arrived after I left and I returned to the room for more discussion with her at the bedside.  I introduced Palliative Medicine as specialized medical care for people living with serious illness. It focuses on providing relief from the symptoms and stress of a serious illness. The goal is to improve quality of life for both the patient and the family.  As far as functional and nutritional status -he tells me he has been going downhill.  He has had a rough year.  He does not feel that things are getting better.  He has lost  a great amount of weight.  He acknowledges feelings of being overwhelmed, depression and anxiety.  He mentions that his father committed suicide when he was the same age.  [name] has considered suicide he is not suicidal currently.  He shares that he feels his depression affects his recovery as well.  He has not been on an antidepressant before but is willing to try.  We discussed patient's current illness and what it means in the larger context of patient's on-going co-morbidities.  Natural disease trajectory and expectations at EOL were discussed.  I attempted to elicit values and goals of care important to the patient.  He would wish to be at home, although he does not want to be a burden on his girlfriend [name].  He also does not like living in a nursing facility.  Advance directives, concepts specific to code status, artificial feeding and hydration, and rehospitalization were considered and discussed.  He would not want CPR in the event that his heart stopped and he stopped breathing.  He would not want to be on life support.  We discussed a DO NOT RESUSCITATE order and he and his girlfriend [name] are in  agreement with this.  Discussed with patient/family the importance of continued conversation with family and the medical providers regarding overall plan of care and treatment options, ensuring decisions are within the context of the patient's values and GOCs.    Hospice and Palliative Care services outpatient were explained and offered. Hard choices book was provided.  Questions and concerns were addressed. The family was encouraged to call with questions or concerns.      SUMMARY OF RECOMMENDATIONS -DNR -Continue current care -Hospice versus palliative were explained and offered patient is uncertain of his decision, he needs to have more discussion with [name] this is also affected by his disposition options-if he has to go to nursing care to be paid under rehab then hospice is not  an option, would need palliative, if he is able to have long-term care placement then hospice would be an option-ideally he would like to return home, however [name] does not feel she can adequately provide care even with hospice support in the home -PMT will return on Monday and follow-up if patient is still admitted -Tussionex 5 mils every 12 hours for 6 doses for cough -Sertraline 25 mg p.o. twice daily for depression and anxiety  Code Status/Advance Care Planning: DNR   Prognosis:   Unable to determine  Discharge Planning: To Be Determined  Primary Diagnoses: Present on Admission:  Sepsis due to pneumonia ([place])  COPD (chronic obstructive pulmonary disease) (HCC)  Tobacco dependence  Essential hypertension  Glaucoma  Severe sepsis (HCC)  Mixed hyperlipidemia  AKI (acute kidney injury) ([place])  Dehydration  Chronic diastolic CHF (congestive heart failure) (HCC)  Hypoalbuminemia due to protein-calorie malnutrition (HCC)  Lactic acidosis  GERD (gastroesophageal reflux disease)  Pressure injury of skin  Hypocalcemia  Hypomagnesemia  PAD (peripheral artery disease) (HCC)  Leukocytosis   Review of Systems  Constitutional:  Positive for activity change and fatigue.  Psychiatric/Behavioral:  Positive for dysphoric mood. Negative for self-injury and suicidal ideas.     Physical Exam Vitals and nursing note reviewed.  Constitutional:      Appearance: He is ill-appearing.     Comments: Cachectic  Neurological:     Mental Status: He is alert and oriented to person, place, and time.  Psychiatric:     Comments: Dysthymic     Vital Signs: BP (!) 90/50 (BP Location: Right Arm)   Pulse 100   Temp 97.7 F (36.5 C) (Oral)   Resp 17   Ht 6' (1.829 m)   Wt 56.6 kg   SpO2 100%   BMI 16.92 kg/m  Pain Scale: 0-10   Pain Score: 6    SpO2: SpO2: 100 % O2 Device:SpO2: 100 % O2 Flow Rate: .O2 Flow Rate (L/min): 3 L/min  IO: Intake/output summary:  Intake/Output Summary  (Last 24 hours) at 08/29/2022 1359 Last data filed at 08/29/2022 0400 Gross per 24 hour  Intake 1297.53 ml  Output 300 ml  Net 997.53 ml    LBM: Last BM Date : 08/28/22 Baseline Weight: Weight: 56.7 kg Most recent weight: Weight: 56.6 kg       Thank you for this consult. Palliative medicine will continue to follow and assist as needed.  Time Total: 90 minutes Greater than 50%  of this time was spent counseling and coordinating care related to the above assessment and plan.  Signed by: Mariana Kaufman, AGNP-C Palliative Medicine    Please contact Palliative Medicine Team phone at [phone] for questions and concerns.  For individual provider: See Shea Evans

## 2022-08-29 NOTE — TOC Initial Note (Addendum)
Transition of Care [facility]) - Initial/Assessment Note    Patient Details  Name: Patrick Beck MRN: [id number] Date of Birth: 24-May-1946  Transition of [facility]) CM/SW Contact:    Ihor Gully, LCSW Phone Number: 08/29/2022, 4:04 PM  Clinical Narrative:                 Patient admitted for sepsis due to PNA. Patient was discharged from [facility] on Friday after 21 days of rehabilitation. Discussed that patient has not had 60 days of wellness. Patient has medicaid and will have to be placed under his medicaid. Referred to facilities of choice.  Significant other, [name], informed patient does not have Medicaid. She has started the process and his Medicaid worker, Ollen Barges, [phone], with [facility] DSS needed an FL2.  Message left for [employer].   Expected Discharge Plan: Skilled Nursing Facility Barriers to Discharge: Continued Medical Work up   Patient Goals and CMS Choice        Expected Discharge Plan and Services Expected Discharge Plan: [place]                                              Prior Living Arrangements/Services                       Activities of Daily Living Home Assistive Devices/Equipment: None ADL Screening (condition at time of admission) Patient's cognitive ability adequate to safely complete daily activities?: Yes Is the patient deaf or have difficulty hearing?: No Does the patient have difficulty seeing, even when wearing glasses/contacts?: No Does the patient have difficulty concentrating, remembering, or making decisions?: Yes Patient able to express need for assistance with ADLs?: Yes Does the patient have difficulty dressing or bathing?: Yes Independently performs ADLs?: Yes (appropriate for developmental age) Communication: Independent Dressing (OT): Needs assistance Grooming: Needs assistance Feeding: Independent Bathing: Independent, Needs assistance Toileting: Needs assistance Is  this a change from baseline?: Pre-admission baseline In/Out Bed: Needs assistance Walks in Home: Needs assistance Is this a change from baseline?: Pre-admission baseline Does the patient have difficulty walking or climbing stairs?: Yes Weakness of Legs: Both Weakness of Arms/Hands: Both  Permission Sought/Granted                  Emotional Assessment     Affect (typically observed): Appropriate Orientation: : Oriented to Self, Oriented to Place, Oriented to  Time, Oriented to Situation Alcohol / Substance Use: Not Applicable    Admission diagnosis:  Sepsis due to pneumonia ([place]) [J18.9, A41.9] Patient Active Problem List   Diagnosis Date Noted   Failure to thrive in adult 08/29/2022   Sepsis due to pneumonia ([place]) 08/28/2022   Hypocalcemia 08/28/2022   Hypomagnesemia 08/28/2022   Leukocytosis 08/28/2022   Pressure injury of skin 07/31/2022   Left arm cellulitis 07/29/2022   Severe sepsis ([place]) 07/29/2022   Insomnia 07/29/2022   Chronic diastolic CHF (congestive heart failure) ([place]) 07/29/2022   Mixed hyperlipidemia 07/29/2022   AKI (acute kidney injury) ([place]) 07/29/2022   Dehydration 07/29/2022   Hyperkalemia 07/29/2022   Hypoalbuminemia due to protein-calorie malnutrition ([place]) 07/29/2022   Supratherapeutic INR 07/29/2022   Lactic acidosis 07/29/2022   Type 2 diabetes mellitus with hyperglycemia ([place]) 07/29/2022   GERD (gastroesophageal reflux disease) 07/29/2022   PAD (peripheral artery disease) ([place]) 06/26/2021  S/P TAVR (transcatheter aortic valve replacement) 06/26/2021   Retained dental root    Periodontal disease    Severe aortic stenosis    COPD (chronic obstructive pulmonary disease) ([place]) 10/22/2017   Tobacco dependence 10/22/2017   Type 2 diabetes mellitus without complication ([place]) 96/29/5284   Essential hypertension 10/22/2017   Glaucoma 10/22/2017   PCP:  Lemmie Evens, MD Pharmacy:  No Pharmacies Listed    Social Determinants of Health  (SDOH) Interventions    Readmission Risk Interventions    08/02/2022   11:42 AM  Readmission Risk Prevention Plan  PCP or Specialist Appt within 5-7 Days Complete  Home Care Screening Complete  Medication Review (RN CM) Complete

## 2022-08-29 NOTE — Evaluation (Signed)
Physical Therapy Evaluation Patient Details Name: Patrick Beck MRN: [id number] DOB: 1946-05-09 Today's Date: 08/29/2022  History of Present Illness  Patrick Beck is a 76 year old gentleman with type 2 diabetes mellitus, GERD, aortic stenosis status post TAVR, coronary artery disease, COPD, chronic tobacco use, peripheral neuropathy, PAD, glaucoma, hypertension, diverticulosis and ED presented to the emergency department after being sent by PCP office when he presented today with hypoxia.  His pulse ox was down to 74% and he appeared pale and lethargic.  He had presented for a hospital follow-up.  He had been hospitalized here approximately 3 weeks ago and treated for cellulitis.  He reports that he has been dealing with chronic diarrhea condition for approximately 1 month.  He says that he recently started smoking again after being discharged from the SNF facility.  He reports that he is having difficulty ambulating.  He also reports productive cough with yellow sputum.  He denies fever and chills.  He was noted to be hypotensive, with an AKI and afebrile.  He was started on sepsis protocol.  His chest x-ray confirmed findings of pneumonia.  He was also noted to have electrolyte abnormalities including hypoglycemia, hypocalcemia and severe hypomagnesemia.  He has been started on treatment for severe dehydration, sepsis pneumonia and electrolyte abnormalities.   Clinical Impression  Patient demonstrates slow labored movement for sitting up at bedside, very unsteady on feet with buckling of knees due to weakness and limited to a few side steps before having to sit due to fatigue and generalized weakness.  Patient tolerated sitting up in chair after therapy with nursing staff in room.  Patient will benefit from continued skilled physical therapy in hospital and recommended venue below to increase strength, balance, endurance for safe ADLs and gait.          Recommendations for follow up therapy are one  component of a multi-disciplinary discharge planning process, led by the attending physician.  Recommendations may be updated based on patient status, additional functional criteria and insurance authorization.  Follow Up Recommendations Skilled nursing-short term rehab (<3 hours/day) Can patient physically be transported by private vehicle: Yes    Assistance Recommended at Discharge    Patient can return home with the following  A lot of help with bathing/dressing/bathroom;A lot of help with walking and/or transfers;Assistance with cooking/housework;Help with stairs or ramp for entrance    Equipment Recommendations None recommended by PT  Recommendations for Other Services       Functional Status Assessment Patient has had a recent decline in their functional status and demonstrates the ability to make significant improvements in function in a reasonable and predictable amount of time.     Precautions / Restrictions Precautions Precautions: Fall Restrictions Weight Bearing Restrictions: No      Mobility  Bed Mobility Overal bed mobility: Needs Assistance Bed Mobility: Supine to Sit     Supine to sit: Min guard, Min assist          Transfers Overall transfer level: Needs assistance Equipment used: Rolling walker (2 wheels) Transfers: Sit to/from Stand, Bed to chair/wheelchair/BSC Sit to Stand: Mod assist   Step pivot transfers: Min assist, Mod assist       General transfer comment: unsteady labored movement    Ambulation/Gait Ambulation/Gait assistance: Mod assist Gait Distance (Feet): 4 Feet Assistive device: Rolling walker (2 wheels) Gait Pattern/deviations: Decreased step length - right, Decreased step length - left, Decreased stride length Gait velocity: decreased     General Gait Details: limited  to a few side steps before having to sit due to c/o generalized weakness and fatigue  Stairs            Wheelchair Mobility    Modified Rankin  (Stroke Patients Only)       Balance Overall balance assessment: Needs assistance Sitting-balance support: Feet supported, No upper extremity supported Sitting balance-Leahy Scale: Fair Sitting balance - Comments: fair/good seated at EOB   Standing balance support: During functional activity, Reliant on assistive device for balance, Bilateral upper extremity supported Standing balance-Leahy Scale: Fair Standing balance comment: fair/poor using RW                             Pertinent Vitals/Pain Pain Assessment Pain Assessment: No/denies pain    Home Living Family/patient expects to be discharged to:: Private residence Living Arrangements: Spouse/significant other Available Help at Discharge: Family;Available 24 hours/day Type of Home: House Home Access: Stairs to enter Entrance Stairs-Rails: Right;Left (to wide to reach both) Entrance Stairs-Number of Steps: 3     Home Equipment: Conservation officer, nature (2 wheels);Cane - single point;Shower seat      Prior Function Prior Level of Function : Independent/Modified Independent             Mobility Comments: household and short distanced community ambulator using SPC, sometimes RW, does not drive ADLs Comments: assisted by family     Hand Dominance        Extremity/Trunk Assessment   Upper Extremity Assessment Upper Extremity Assessment: Generalized weakness    Lower Extremity Assessment Lower Extremity Assessment: Generalized weakness    Cervical / Trunk Assessment Cervical / Trunk Assessment: Normal  Communication   Communication: No difficulties  Cognition Arousal/Alertness: Awake/alert Behavior During Therapy: WFL for tasks assessed/performed Overall Cognitive Status: Within Functional Limits for tasks assessed                                          General Comments      Exercises     Assessment/Plan    PT Assessment Patient needs continued PT services  PT Problem List  Decreased strength;Decreased activity tolerance;Decreased balance;Decreased mobility       PT Treatment Interventions DME instruction;Gait training;Stair training;Functional mobility training;Therapeutic activities;Therapeutic exercise;Patient/family education;Balance training    PT Goals (Current goals can be found in the Care Plan section)  Acute Rehab PT Goals Patient Stated Goal: return home with family to assist PT Goal Formulation: With patient Time For Goal Achievement: 09/12/22 Potential to Achieve Goals: Good    Frequency Min 3X/week     Co-evaluation               AM-PAC PT "6 Clicks" Mobility  Outcome Measure Help needed turning from your back to your side while in a flat bed without using bedrails?: A Little Help needed moving from lying on your back to sitting on the side of a flat bed without using bedrails?: A Little Help needed moving to and from a bed to a chair (including a wheelchair)?: A Lot Help needed standing up from a chair using your arms (e.g., wheelchair or bedside chair)?: A Lot Help needed to walk in hospital room?: A Lot Help needed climbing 3-5 steps with a railing? : Total 6 Click Score: 13    End of Session   Activity Tolerance: Patient tolerated treatment well;Patient limited  by fatigue Patient left: in chair;with call bell/phone within reach Nurse Communication: Mobility status PT Visit Diagnosis: Unsteadiness on feet (R26.81);Other abnormalities of gait and mobility (R26.89);Muscle weakness (generalized) (M62.81)    Time: 9628-3662 PT Time Calculation (min) (ACUTE ONLY): 31 min   Charges:   PT Evaluation $PT Eval Moderate Complexity: 1 Mod PT Treatments $Therapeutic Activity: 23-37 mins        2:04 PM, 08/29/22 Lonell Grandchild, MPT Physical Therapist with [facility] [phone] office [phone] mobile phone

## 2022-08-29 NOTE — Evaluation (Signed)
Clinical/Bedside Swallow Evaluation Patient Details  Name: Patrick Beck MRN: [id number] Date of Birth: February 21, 1946  Today's Date: 08/29/2022 Time: SLP Start Time (ACUTE ONLY): 72 SLP Stop Time (ACUTE ONLY): 7116 SLP Time Calculation (min) (ACUTE ONLY): 23 min  Past Medical History:  Past Medical History:  Diagnosis Date   Anxiety    Aortic atherosclerosis ([place])    Arthritis    Carotid artery disease (HCC)    CHF (congestive heart failure) (HCC)    COPD (chronic obstructive pulmonary disease) ([place])    Depression    Diverticulosis    Erectile dysfunction    Essential hypertension    GERD (gastroesophageal reflux disease)    Glaucoma    H/O hiatal hernia    History of hyperlipidemia    Neuropathy    PAD (peripheral artery disease) (HCC)    Pneumonia ish   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 Angelena Form & Dr. Cyndia Bent   Severe aortic stenosis    Type 2 diabetes mellitus [facility])    Past Surgical History:  Past Surgical History:  Procedure Laterality Date   BIOPSY  07/03/2022   Procedure: BIOPSY;  Surgeon: Eloise Harman, DO;  [facility];  Service: Endoscopy;;   CARDIAC CATHETERIZATION     COLONOSCOPY N/A 04/06/2013   Procedure: COLONOSCOPY;  Surgeon: Jamesetta So, MD;  [facility];  Service: Gastroenterology;  Laterality: N/A;   ESOPHAGOGASTRODUODENOSCOPY (EGD) WITH PROPOFOL N/A 07/03/2022   Procedure: ESOPHAGOGASTRODUODENOSCOPY (EGD) WITH PROPOFOL;  Surgeon: Eloise Harman, DO;  [facility];  Service: Endoscopy;  Laterality: N/A;  1:00pm, moved to 7/5 @ 9:45   EYE SURGERY Left    laser sx fr glaucoma   HERNIA REPAIR     Three prior surgeries before 1966   MULTIPLE EXTRACTIONS WITH ALVEOLOPLASTY N/A 05/24/2021   Procedure: EXTRACTION OF TEETH NUMBER TWO, THREE, FIVE, SIX, SEVEN, EIGHT, NINE, TEN, ELEVEN, TWELVE, SEVENTEEN, EIGHTTEEN,  NINETEEN, TWENTY, TWENTY-ONE, TWENTY- TWO, TWENTY-FOUR, THWENTY-SEVEN, TWENTY-EIGHT, TWENTY-NINE WITH ALVEOLOPLASTY IN ALL FOUR QUADRANTS;  Surgeon: [name], DMD;  Location: [place];  Service: Dentistry;  Laterality: N/A;   RIGHT/LEFT HEART CATH AND CORONARY ANGIOGRAPHY N/A 04/18/2021   Procedure: RIGHT/LEFT HEART CATH AND CORONARY ANGIOGRAPHY;  Surgeon: Burnell Blanks, MD;  Location: [place] [facility];  Service: Cardiovascular;  Laterality: N/A;   TEE WITHOUT CARDIOVERSION N/A 06/26/2021   Procedure: TRANSESOPHAGEAL ECHOCARDIOGRAM (TEE);  Surgeon: Burnell Blanks, MD;  Location: [place];  Service: Open Heart Surgery;  Laterality: N/A;   TONSILLECTOMY     at age 53   HPI:  Patrick Beck is a 76 year old gentleman with type 2 diabetes mellitus, GERD, aortic stenosis status post TAVR, coronary artery disease, COPD, chronic tobacco use, peripheral neuropathy, PAD, glaucoma, hypertension, diverticulosis and ED presented to the emergency department after being sent by PCP office when he presented today with hypoxia.  His pulse ox was down to 74% and he appeared pale and lethargic.  He had presented for a hospital follow-up.  He had been hospitalized here approximately 3 weeks ago and treated for cellulitis.  He reports that he has been dealing with chronic diarrhea condition for approximately 1 month.  He says that he recently started smoking again after being discharged from the SNF facility.  He reports that he is having difficulty ambulating.  He also reports productive cough with yellow sputum.  He denies fever and chills.  He was noted to be hypotensive, with an AKI and afebrile.  He was started on sepsis protocol.  His chest x-ray confirmed findings of pneumonia.  He was also noted to have electrolyte abnormalities including hypoglycemia, hypocalcemia and severe hypomagnesemia.  He has been started on treatment for severe dehydration, sepsis pneumonia and electrolyte abnormalities. BSE  requested.    Assessment / Plan / Recommendation  Clinical Impression  Clinical swallow evaluation completed with Pt sitting upright in chair. Pt denies difficulty swallowing, but reports that it is easier for him to chew softer foods due to lack of dentition. He reports that he has cut back on cigarette smoking. He had an EGD in July which showed Barrett's esophagus and he reports feeling pressure in his chest when he lays down after he eats. Oral motor examination is WNL. Pt consumed ice chips, thin water via cup/straw, puree, regular textures, and pills with thin liquids and exhibited no overt signs of aspiration and no reports of globus. Pt with a delayed cough, however has been coughing intermittently since admission. RN reported that Pt took all of his AM medications without incident. He consumed everything on his lunch tray. While Pt was able to masticate the graham crackers, he reports some chewing difficulties with meats due to edentulous status. Recommend D3/mech soft and thin liquids with standard aspiration and reflux precautions and PO medications whole with water when alert and upright. Acknowledge that the CT chest shows debris with suspicion of aspiration. SLP reinforced the importance and masticating solids well and altering textures as needed to accommodate for edentulous status. Can complete MBSS if MD would like, SLP will f/u with Pt tomorrow to determine needs. Above to RN. SLP Visit Diagnosis: Dysphagia, unspecified (R13.10)    Aspiration Risk  Mild aspiration risk    Diet Recommendation Dysphagia 3 (Mech soft);Thin liquid   Liquid Administration via: Cup;Straw Medication Administration: Whole meds with liquid Supervision: Patient able to self feed Compensations: Slow rate Postural Changes: Seated upright at 90 degrees;Remain upright for at least 30 minutes after po intake    Other  Recommendations Oral Care Recommendations: Oral care BID Other Recommendations: Clarify  dietary restrictions    Recommendations for follow up therapy are one component of a multi-disciplinary discharge planning process, led by the attending physician.  Recommendations may be updated based on patient status, additional functional criteria and insurance authorization.  Follow up Recommendations Follow physician's recommendations for discharge plan and follow up therapies      Assistance Recommended at Discharge PRN  Functional Status Assessment Patient has had a recent decline in their functional status and demonstrates the ability to make significant improvements in function in a reasonable and predictable amount of time.  Frequency and Duration min 2x/week  1 week       Prognosis Prognosis for Safe Diet Advancement: Good      Swallow Study   General Date of Onset: 08/28/22 HPI: Patrick Beck is a 76 year old gentleman with type 2 diabetes mellitus, GERD, aortic stenosis status post TAVR, coronary artery disease, COPD, chronic tobacco use, peripheral neuropathy, PAD, glaucoma, hypertension, diverticulosis and ED presented to the emergency department after being sent by PCP office when he presented today with hypoxia.  His pulse ox was down to 74% and he appeared pale and lethargic.  He had presented for a hospital follow-up.  He had been hospitalized here approximately 3 weeks ago and treated for cellulitis.  He reports that he has been  dealing with chronic diarrhea condition for approximately 1 month.  He says that he recently started smoking again after being discharged from the SNF facility.  He reports that he is having difficulty ambulating.  He also reports productive cough with yellow sputum.  He denies fever and chills.  He was noted to be hypotensive, with an AKI and afebrile.  He was started on sepsis protocol.  His chest x-ray confirmed findings of pneumonia.  He was also noted to have electrolyte abnormalities including hypoglycemia, hypocalcemia and severe  hypomagnesemia.  He has been started on treatment for severe dehydration, sepsis pneumonia and electrolyte abnormalities. BSE requested. Type of Study: Bedside Swallow Evaluation Previous Swallow Assessment: N/A, did have EGD in July 2023 with findings of Barrett's esophagus Diet Prior to this Study: Dysphagia 2 (chopped);Thin liquids Temperature Spikes Noted: No Respiratory Status: Nasal cannula History of Recent Intubation: No Behavior/Cognition: Alert;Cooperative;Pleasant mood Oral Cavity Assessment: Within Functional Limits Oral Care Completed by SLP: Recent completion by staff Oral Cavity - Dentition: Edentulous Vision: Functional for self-feeding Self-Feeding Abilities: Able to feed self Patient Positioning: Upright in chair Baseline Vocal Quality: Normal (mild harshness/breathiness (Pt is a smoker)) Volitional Cough: Strong Volitional Swallow: Able to elicit    Oral/Motor/Sensory Function Overall Oral Motor/Sensory Function: Within functional limits   Ice Chips Ice chips: Within functional limits Presentation: Spoon   Thin Liquid Thin Liquid: Within functional limits Presentation: Cup;Self Fed;Straw    Nectar Thick Nectar Thick Liquid: Not tested   Honey Thick Honey Thick Liquid: Not tested   Puree Puree: Within functional limits Presentation: Spoon;Self Fed   Solid     Solid: Within functional limits Presentation: Self Fed     Thank you,  Genene Churn, [place]  Syna Gad 08/29/2022,3:10 PM

## 2022-08-29 NOTE — Progress Notes (Signed)
PROGRESS NOTE   Patrick Beck  EGB:[id number] DOB: June 12, 1946 DOA: 08/28/2022 PCP: Lemmie Evens, MD   Chief Complaint  Patient presents with   Shortness of Breath   Level of care: Telemetry  Brief Admission History:  76 year old gentleman with type 2 diabetes mellitus, GERD, aortic stenosis status post TAVR, coronary artery disease, COPD, chronic tobacco use, peripheral neuropathy, PAD, glaucoma, hypertension, diverticulosis and ED presented to the emergency department after being sent by PCP office when he presented today with hypoxia.  His pulse ox was down to 74% and he appeared pale and lethargic.  He had presented for a hospital follow-up.  He had been hospitalized here approximately 3 weeks ago and treated for cellulitis.  He reports that he has been dealing with chronic diarrhea condition for approximately 1 month.  He says that he recently started smoking again after being discharged from the SNF facility.  He reports that he is having difficulty ambulating.  He also reports productive cough with yellow sputum.  He denies fever and chills.  He was noted to be hypotensive, with an AKI and afebrile.  He was started on sepsis protocol.  His chest x-ray confirmed findings of pneumonia.  He was also noted to have electrolyte abnormalities including hypoglycemia, hypocalcemia and severe hypomagnesemia.  He has been started on treatment for severe dehydration, sepsis pneumonia and electrolyte abnormalities.   Assessment and Plan:  Severe sepsis due to pneumonia - pt presenting with findings on CT scan in [place] consistent with aspiration  - continue IV antibiotics and supportive measures - SLP evaluation for swallow function  - supplemental oxygen as needed  - continue sepsis protocol as ordered - treating and trending lactic acid  - aspiration precautions ordered    AKI and dehydration  - prerenal from frequent diarrhea and ongoing use of diuretics - stop diuretics - IV fluid hydration -  follow renal function    Hypoglycemia  - 1 amp D50 given  - Feed patient  - follow CBG closely  - sensitive SSI coverage only    Hypocalcemia  - IV replacement given - recheck in AM    Hypomagnesemia  - IV replacement ordered - recheck in AM    Leukocytosis  - follow CBC and treating pneumonia and sepsis aggressively    Glaucoma  - resume home medication    Tobacco  - Pt declines nicotine patch at this time - he had resumed smoking since DC from SNF recently   Generalized weakness and failure to thrive  - significant other reports patient can barely walk at home and severely deconditioned  - he likely will need LTC placement as he was just released from SNF - he obviously has failure to thrive despite maximal medical treatments and will appropriately ask for a palliative care consultation  - PT evaluation requested  - fall precautions    Type 2 DM with neurological complications - sensitive SSI coverage with frequent CBG monitoring ordered    Chronic diarrhea - unknown cause - check stool for C diff - GI pathogen panel ordered - IV fluid rehydration  - replacing electrolytes - if persists, would ask for GI eval given >1 month of symptoms    GERD  - protonix ordered for GI protection    DVT prophylaxis: [place] heparin  Code Status: Full   Family Communication: significant other at bedside updated Disposition Plan: TBD   Consults called:   Admission status: INP   Disposition: Status is: Inpatient Remains inpatient appropriate because:  IV antibiotic, IV fluid    Consultants:  Palliative medicine  Procedures:   Antimicrobials:    Subjective: Pt eating a bit better, still very short of breath, seems depressed Objective: Vitals:   08/29/22 0852 08/29/22 1219 08/29/22 1416 08/29/22 1527  BP: 105/69 (!) 90/50  113/68  Pulse: 93 100    Resp: 20 17    Temp: 97.7 F (36.5 C)   97.8 F (36.6 C)  TempSrc: Oral   Oral  SpO2: 100%  94%   Weight:      Height:         Intake/Output Summary (Last 24 hours) at 08/29/2022 1731 Last data filed at 08/29/2022 1612 Gross per 24 hour  Intake 2712.77 ml  Output 300 ml  Net 2412.77 ml   Filed Weights   08/28/22 1233 08/28/22 2150  Weight: 56.7 kg 56.6 kg   Examination:  General exam: very frail, chronically ill appearing male, Appears calm and comfortable  Respiratory system: rales RLL RUL. Respiratory effort normal. Cardiovascular system: normal S1 & S2 heard. No JVD, murmurs, rubs, gallops or clicks. No pedal edema. Gastrointestinal system: Abdomen is nondistended, soft and nontender. No organomegaly or masses felt. Normal bowel sounds heard. Central nervous system: Alert and oriented. No focal neurological deficits. Extremities: Symmetric 5 x 5 power. Skin: No rashes, lesions or ulcers. Psychiatry: Judgement and insight appear normal. Mood & affect flat, to sad.   Data Reviewed: I have personally reviewed following labs and imaging studies  CBC: Recent Labs  Lab 08/28/22 1304 08/29/22 0440  WBC 11.5* 10.2  NEUTROABS 9.5*  --   HGB 11.6* 9.7*  HCT 35.7* 30.2*  MCV 99.7 100.0  PLT 171 993    Basic Metabolic Panel: Recent Labs  Lab 08/28/22 1304 08/29/22 0440  NA 136 135  K 4.1 3.9  CL 103 103  CO2 21* 26  GLUCOSE 57* 230*  BUN 49* 46*  CREATININE 1.75* 1.56*  CALCIUM 5.8* 6.1*  MG 0.9* 1.5*  PHOS  --  4.4    CBG: Recent Labs  Lab 08/28/22 1706 08/28/22 2245 08/29/22 0354 08/29/22 0727 08/29/22 1131  GLUCAP 129* 350* 233* 168* 139*    Recent Results (from the past 240 hour(s))  Blood Culture (routine x 2)     Status: None (Preliminary result)   Collection Time: 08/28/22  1:04 PM   Specimen: BLOOD  Result Value Ref Range Status   Specimen Description BLOOD BLOOD LEFT FOREARM  Final   Special Requests   Final    BOTTLES DRAWN AEROBIC AND ANAEROBIC Blood Culture results may not be optimal due to an inadequate volume of blood received in culture bottles   Culture    Final    NO GROWTH < 24 HOURS Performed at [facility], [address] [place], [place] [address]    Report Status PENDING  Incomplete  Blood Culture (routine x 2)     Status: None (Preliminary result)   Collection Time: 08/28/22  1:28 PM   Specimen: Right Antecubital; Blood  Result Value Ref Range Status   Specimen Description RIGHT ANTECUBITAL  Final   Special Requests   Final    BOTTLES DRAWN AEROBIC AND ANAEROBIC Blood Culture results may not be optimal due to an excessive volume of blood received in culture bottles   Culture   Final    NO GROWTH < 24 HOURS Performed at [facility], [address], [place], [place] [address]    Report Status PENDING  Incomplete  MRSA Next Gen by PCR, Nasal     Status: None   Collection Time: 08/28/22  4:42 PM   Specimen: Nasal Mucosa; Nasal Swab  Result Value Ref Range Status   MRSA by PCR Next Gen NOT DETECTED NOT DETECTED Final    Comment: (NOTE) The GeneXpert MRSA Assay (FDA approved for NASAL specimens only), is one component of a comprehensive MRSA colonization surveillance program. It is not intended to diagnose MRSA infection nor to guide or monitor treatment for MRSA infections. Test performance is not FDA approved in patients less than 68 years old. Performed at [facility], [address], [place], [place] [address]      Radiology Studies: CT Chest Wo Contrast  Result Date: 08/28/2022 CLINICAL DATA:  Pneumonia EXAM: CT CHEST WITHOUT CONTRAST TECHNIQUE: Multidetector CT imaging of the chest was performed following the standard protocol without IV contrast. RADIATION DOSE REDUCTION: This exam was performed according to the departmental dose-optimization program which includes automated exposure control, adjustment of the mA and/or kV according to patient size and/or use of iterative reconstruction technique. COMPARISON:  Chest x-ray dated August 30th 2023; CT chest angio dated April 27, 2021; chest CT dated March 01, 2019 FINDINGS:  Cardiovascular: Normal heart size. Prior transcatheter aortic valve replacement. Trace pericardial effusion. Moderate coronary artery calcifications of the LAD and circumflex. Mildly dilated ascending thoracic aorta, measuring up to 4.0 cm unchanged when compared with prior exam. Severe calcified plaque of the thoracic aorta. Mediastinum/Nodes: Esophagus and thyroid are unremarkable. No pathologically enlarged lymph nodes seen in the chest. Lungs/Pleura: Debris seen in the left mainstem bronchus and posterior left lower lobe bronchi. Mild left lower lobe ground-glass opacities. New small ground-glass nodule of the left lower lobe measuring 7 mm on series 2, image 86. Subpleural nodular opacity of the left upper lobe located on series 4, image 83 unchanged when compared with prior chest CTs and likely to scarring. Bibasilar atelectasis. Small left-greater-than-right pleural effusions. Upper Abdomen: Small volume abdominal ascites. Musculoskeletal: Moderate age indeterminate compression deformities of T8 and T12, new when compared with April 27, 2021 exam. IMPRESSION: 1. Debris seen in the left mainstem bronchus and posterior left lower lobe bronchi with mild left lower ground-glass opacities, findings are favored to be due to aspiration. 2. New small ground-glass nodule of the left lower lobe measuring 7 mm, likely due to aspiration as described above. Initial follow-up with CT at 6 months is recommended to confirm persistence. If persistent, repeat CT is recommended every 2 years until 5 years of stability has been established. This recommendation follows the consensus statement: Guidelines for Management of Incidental Pulmonary Nodules Detected on CT Images: From the Fleischner Society 2017; Radiology 2017; 284:228-243. 3. Small left-greater-than-right pleural effusions and partially visualized small volume abdominal ascites. 4. Moderate age indeterminate compression deformities of T8 and T12, new when compared  with April 27, 2021 exam. Correlate for point tenderness. 5. Aortic Atherosclerosis (ICD10-I70.0) and Emphysema (ICD10-J43.9). Electronically Signed   By: Yetta Glassman M.D.   On: 08/28/2022 15:39   DG Chest Port 1 View  Result Date: 08/28/2022 CLINICAL DATA:  Provided history: Questionable sepsis-evaluate for abnormality. Additional history provided: Shortness of breath, diarrhea. History of CHF, COPD, pneumonia. Current smoker. EXAM: PORTABLE CHEST 1 VIEW COMPARISON:  Prior chest radiographs 07/29/2022 and earlier. Chest CT 04/27/2021. FINDINGS: Heart size within normal limits. Prior TAVR. Aortic atherosclerosis. Multifocal opacity within the left mid and lower lung fields which may reflect atelectasis and/or pneumonia. Superimposed scarring within the left mid  lung field. No evidence of pleural effusion or pneumothorax. No acute bony abnormality identified. Dextrocurvature of the thoracic spine. IMPRESSION: Multifocal opacity within the left mid and lower lung fields which may reflect atelectasis and/or pneumonia. Radiographic follow-up to resolution recommended to exclude underlying malignancy. Redemonstrated foci of scarring within the left mid lung field. Aortic Atherosclerosis (ICD10-I70.0). Electronically Signed   By: Kellie Simmering D.O.   On: 08/28/2022 13:29    Scheduled Meds:  aspirin  81 mg Oral Daily   brimonidine  1 drop Both Eyes TID   chlorpheniramine-HYDROcodone  5 mL Oral Q12H   feeding supplement  237 mL Oral BID BM   guaiFENesin  1,200 mg Oral BID   heparin  5,000 Units Subcutaneous Q8H   insulin aspart  0-9 Units Subcutaneous TID WC   ipratropium-albuterol  3 mL Nebulization Q6H   latanoprost  1 drop Both Eyes QHS   multivitamin with minerals  1 tablet Oral Q supper   pantoprazole  40 mg Oral Daily   pravastatin  40 mg Oral QPM   saccharomyces boulardii  250 mg Oral BID   sertraline  25 mg Oral Daily   timolol  1 drop Both Eyes QPM   Continuous Infusions:  ceFEPime  (MAXIPIME) IV Stopped (08/29/22 1055)     LOS: 1 day   Time spent: 36 mins  Jeananne Bedwell Wynetta Emery, MD How to contact the [facility] Attending or Consulting provider [place] or covering provider during after hours [place], for this patient?  Check the care team in [facility] and look for a) attending/consulting [facility] provider listed and b) the [facility] team listed Log into [email or web address] and use [place]'s universal password to access. If you do not have the password, please contact the hospital operator. Locate the [facility] provider you are looking for under [facility] and page to a number that you can be directly reached. If you still have difficulty reaching the provider, please page the [facility] (Director on Call) for the Hospitalists listed on amion for assistance.  08/29/2022, 5:31 PM

## 2022-08-29 NOTE — Progress Notes (Signed)
Patient has a critical value of Calcium 6. 1, Dr. Clearence Ped notified. No new orders at this time.

## 2022-08-29 NOTE — Plan of Care (Signed)
  Problem: Acute Rehab PT Goals(only PT should resolve) Goal: Pt Will Go Supine/Side To Sit Outcome: Progressing Flowsheets (Taken 08/29/2022 1405) Pt will go Supine/Side to Sit:  with supervision  with modified independence Goal: Patient Will Transfer Sit To/From Stand Outcome: Progressing Flowsheets (Taken 08/29/2022 1405) Patient will transfer sit to/from stand:  with min guard assist  with minimal assist Goal: Pt Will Transfer Bed To Chair/Chair To Bed Outcome: Progressing Flowsheets (Taken 08/29/2022 1405) Pt will Transfer Bed to Chair/Chair to Bed:  min guard assist  with min assist Goal: Pt Will Ambulate Outcome: Progressing Flowsheets (Taken 08/29/2022 1405) Pt will Ambulate:  25 feet  with minimal assist  with rolling walker   2:06 PM, 08/29/22 Lonell Grandchild, MPT Physical Therapist with [facility] [phone] office [phone] mobile phone

## 2022-08-30 DIAGNOSIS — N179 Acute kidney failure, unspecified: Secondary | ICD-10-CM | POA: Diagnosis not present

## 2022-08-30 DIAGNOSIS — J449 Chronic obstructive pulmonary disease, unspecified: Secondary | ICD-10-CM | POA: Diagnosis not present

## 2022-08-30 DIAGNOSIS — J189 Pneumonia, unspecified organism: Secondary | ICD-10-CM | POA: Diagnosis not present

## 2022-08-30 DIAGNOSIS — I5032 Chronic diastolic (congestive) heart failure: Secondary | ICD-10-CM | POA: Diagnosis not present

## 2022-08-30 LAB — COMPREHENSIVE METABOLIC PANEL
ALT: 15 U/L (ref 0–44)
AST: 14 U/L — ABNORMAL LOW (ref 15–41)
Albumin: <1.5 g/dL — ABNORMAL LOW (ref 3.5–5.0)
Alkaline Phosphatase: 113 U/L (ref 38–126)
Anion gap: 5 (ref 5–15)
BUN: 45 mg/dL — ABNORMAL HIGH (ref 8–23)
CO2: 24 mmol/L (ref 22–32)
Calcium: 6.4 mg/dL — CL (ref 8.9–10.3)
Chloride: 106 mmol/L (ref 98–111)
Creatinine, Ser: 1.27 mg/dL — ABNORMAL HIGH (ref 0.61–1.24)
GFR, Estimated: 59 mL/min — ABNORMAL LOW (ref >60)
Glucose, Bld: 78 mg/dL (ref 70–99)
Potassium: 4.1 mmol/L (ref 3.5–5.1)
Sodium: 135 mmol/L (ref 135–145)
Total Bilirubin: 0.7 mg/dL (ref 0.3–1.2)
Total Protein: 3.4 g/dL — ABNORMAL LOW (ref 6.5–8.1)

## 2022-08-30 LAB — GLUCOSE, CAPILLARY
Glucose-Capillary: 77 mg/dL (ref 70–99)
Glucose-Capillary: 79 mg/dL (ref 70–99)
Glucose-Capillary: 136 mg/dL — ABNORMAL HIGH (ref 70–99)
Glucose-Capillary: 190 mg/dL — ABNORMAL HIGH (ref 70–99)
Glucose-Capillary: 114 mg/dL — ABNORMAL HIGH (ref 70–99)

## 2022-08-30 LAB — CBC
HCT: 30.3 % — ABNORMAL LOW (ref 39.0–52.0)
Hemoglobin: 9.6 g/dL — ABNORMAL LOW (ref 13.0–17.0)
MCH: 32.3 pg (ref 26.0–34.0)
MCHC: 31.7 g/dL (ref 30.0–36.0)
MCV: 102 fL — ABNORMAL HIGH (ref 80.0–100.0)
Platelets: 158 10*3/uL (ref 150–400)
RBC: 2.97 MIL/uL — ABNORMAL LOW (ref 4.22–5.81)
RDW: 15.2 % (ref 11.5–15.5)
WBC: 8.8 10*3/uL (ref 4.0–10.5)
nRBC: 0 % (ref 0.0–0.2)

## 2022-08-30 LAB — MAGNESIUM: Magnesium: 1.4 mg/dL — ABNORMAL LOW (ref 1.7–2.4)

## 2022-08-30 MED ORDER — CALCIUM CARBONATE ANTACID 500 MG PO CHEW
1.0000 | CHEWABLE_TABLET | Freq: Three times a day (TID) | ORAL | Status: DC
  Administered 2022-08-30 – 2022-08-31 (×4): 200 mg via ORAL
  Filled 2022-08-30 (×5): qty 1

## 2022-08-30 MED ORDER — MAGNESIUM SULFATE 4 GM/100ML IV SOLN
4.0000 g | Freq: Once | INTRAVENOUS | Status: AC
  Administered 2022-08-30: 4 g via INTRAVENOUS
  Filled 2022-08-30: qty 100

## 2022-08-30 NOTE — Progress Notes (Signed)
PROGRESS NOTE   Patrick Beck  FHQ:[id number] DOB: 02-12-1946 DOA: 08/28/2022 PCP: Lemmie Evens, MD   Chief Complaint  Patient presents with   Shortness of Breath   Level of care: Med-Surg  Brief Admission History:  76 year old gentleman with type 2 diabetes mellitus, GERD, aortic stenosis status post TAVR, coronary artery disease, COPD, chronic tobacco use, peripheral neuropathy, PAD, glaucoma, hypertension, diverticulosis and ED presented to the emergency department after being sent by PCP office when he presented today with hypoxia.  His pulse ox was down to 74% and he appeared pale and lethargic.  He had presented for a hospital follow-up.  He had been hospitalized here approximately 3 weeks ago and treated for cellulitis.  He reports that he has been dealing with chronic diarrhea condition for approximately 1 month.  He says that he recently started smoking again after being discharged from the SNF facility.  He reports that he is having difficulty ambulating.  He also reports productive cough with yellow sputum.  He denies fever and chills.  He was noted to be hypotensive, with an AKI and afebrile.  He was started on sepsis protocol.  His chest x-ray confirmed findings of pneumonia.  He was also noted to have electrolyte abnormalities including hypoglycemia, hypocalcemia and severe hypomagnesemia.  He has been started on treatment for severe dehydration, sepsis pneumonia and electrolyte abnormalities.   Assessment and Plan:  Severe sepsis due to pneumonia - pt presenting with findings on CT scan in [facility] consistent with aspiration  - continue IV antibiotics and supportive measures - SLP evaluation for swallow function  - supplemental oxygen as needed  - continue sepsis protocol as ordered - treating and trending lactic acid  - aspiration precautions ordered    AKI and dehydration  - prerenal from frequent diarrhea and ongoing use of diuretics - stop diuretics - IV fluid hydration -  follow renal function    Hypoglycemia - treated  - 1 amp D50 given  - no recurrences reported since feeding patient   - follow CBG closely  - sensitive SSI coverage only    Hypocalcemia  - IV replacement given - start tums TID oral replacement   Hypomagnesemia  - IV replacement ordered   Leukocytosis  - follow CBC and treating pneumonia and sepsis aggressively  - WBC trending down    Glaucoma  - resume home medication    Tobacco  - Pt declines nicotine patch at this time - he had resumed smoking since DC from SNF recently   Generalized weakness and failure to thrive  - significant other reports patient can barely walk at home and severely deconditioned  - he likely will need LTC placement as he was just released from SNF - he obviously has failure to thrive despite maximal medical treatments and will appropriately ask for a palliative care consultation  - PT evaluation requested  - fall precautions  - Pt now DNR after palliative discussions   Type 2 DM with neurological complications - sensitive SSI coverage with frequent CBG monitoring ordered    Chronic diarrhea - we have not found evidence of this since admission - DC enteric precautions for now - stool for C diff order expired due to no diarrhea - GI pathogen panel ordered but expired with no diarrhea - IV fluid rehydration  - replacing electrolytes    GERD  - protonix ordered for GI protection    DVT prophylaxis: [place] heparin  Code Status: Full   Family Communication: significant other  at bedside updated Disposition Plan: TBD   Consults called:   Admission status: INP   Disposition: Status is: Inpatient Remains inpatient appropriate because: IV antibiotic, IV fluid    Consultants:  Palliative medicine  Procedures:   Antimicrobials:    Subjective: Pt eating a bit better, still very short of breath, seems depressed Objective: Vitals:   08/29/22 2032 08/29/22 2137 08/30/22 0422 08/30/22 0732  BP:   111/60 110/64   Pulse:  73 62   Resp:  18 18   Temp:  97.6 F (36.4 C) 97.6 F (36.4 C)   TempSrc:  Oral    SpO2: 96% 100% 100% 100%  Weight:   59.6 kg   Height:        Intake/Output Summary (Last 24 hours) at 08/30/2022 0902 Last data filed at 08/30/2022 0900 Gross per 24 hour  Intake 1755.24 ml  Output 700 ml  Net 1055.24 ml   Filed Weights   08/28/22 1233 08/28/22 2150 08/30/22 0422  Weight: 56.7 kg 56.6 kg 59.6 kg   Examination:  General exam: very frail, chronically ill appearing male, Appears calm and comfortable  Respiratory system: rales RLL RUL. Respiratory effort normal. Cardiovascular system: normal S1 & S2 heard. No JVD, murmurs, rubs, gallops or clicks. No pedal edema. Gastrointestinal system: Abdomen is nondistended, soft and nontender. No organomegaly or masses felt. Normal bowel sounds heard. Central nervous system: Alert and oriented. No focal neurological deficits. Extremities: Symmetric 5 x 5 power. Skin: No rashes, lesions or ulcers. Psychiatry: Judgement and insight appear normal. Mood & affect flat, to sad.   Data Reviewed: I have personally reviewed following labs and imaging studies  CBC: Recent Labs  Lab 08/28/22 1304 08/29/22 0440 08/30/22 0528  WBC 11.5* 10.2 8.8  NEUTROABS 9.5*  --   --   HGB 11.6* 9.7* 9.6*  HCT 35.7* 30.2* 30.3*  MCV 99.7 100.0 102.0*  PLT 171 174 371    Basic Metabolic Panel: Recent Labs  Lab 08/28/22 1304 08/29/22 0440 08/30/22 0528  NA 136 135 135  K 4.1 3.9 4.1  CL 103 103 106  CO2 21* 26 24  GLUCOSE 57* 230* 78  BUN 49* 46* 45*  CREATININE 1.75* 1.56* 1.27*  CALCIUM 5.8* 6.1* 6.4*  MG 0.9* 1.5* 1.4*  PHOS  --  4.4  --     CBG: Recent Labs  Lab 08/29/22 1131 08/29/22 1651 08/29/22 2140 08/30/22 0426 08/30/22 0737  GLUCAP 139* 206* 223* 77 79    Recent Results (from the past 240 hour(s))  Blood Culture (routine x 2)     Status: None (Preliminary result)   Collection Time: 08/28/22  1:04 PM    Specimen: BLOOD  Result Value Ref Range Status   Specimen Description BLOOD BLOOD LEFT FOREARM  Final   Special Requests   Final    BOTTLES DRAWN AEROBIC AND ANAEROBIC Blood Culture results may not be optimal due to an inadequate volume of blood received in culture bottles   Culture   Final    NO GROWTH 2 DAYS Performed at [facility], [address], [place], [place] [address]    Report Status PENDING  Incomplete  Blood Culture (routine x 2)     Status: None (Preliminary result)   Collection Time: 08/28/22  1:28 PM   Specimen: Right Antecubital; Blood  Result Value Ref Range Status   Specimen Description RIGHT ANTECUBITAL  Final   Special Requests   Final    BOTTLES DRAWN AEROBIC AND ANAEROBIC  Blood Culture results may not be optimal due to an excessive volume of blood received in culture bottles   Culture   Final    NO GROWTH 2 DAYS Performed at [facility], [address], [place], [place] [address]    Report Status PENDING  Incomplete  MRSA Next Gen by PCR, Nasal     Status: None   Collection Time: 08/28/22  4:42 PM   Specimen: Nasal Mucosa; Nasal Swab  Result Value Ref Range Status   MRSA by PCR Next Gen NOT DETECTED NOT DETECTED Final    Comment: (NOTE) The GeneXpert MRSA Assay (FDA approved for NASAL specimens only), is one component of a comprehensive MRSA colonization surveillance program. It is not intended to diagnose MRSA infection nor to guide or monitor treatment for MRSA infections. Test performance is not FDA approved in patients less than 74 years old. Performed at [facility], [address], [place], [place] [address]      Radiology Studies: CT Chest Wo Contrast  Result Date: 08/28/2022 CLINICAL DATA:  Pneumonia EXAM: CT CHEST WITHOUT CONTRAST TECHNIQUE: Multidetector CT imaging of the chest was performed following the standard protocol without IV contrast. RADIATION DOSE REDUCTION: This exam was performed according to the departmental dose-optimization  program which includes automated exposure control, adjustment of the mA and/or kV according to patient size and/or use of iterative reconstruction technique. COMPARISON:  Chest x-ray dated August 30th 2023; CT chest angio dated April 27, 2021; chest CT dated March 01, 2019 FINDINGS: Cardiovascular: Normal heart size. Prior transcatheter aortic valve replacement. Trace pericardial effusion. Moderate coronary artery calcifications of the LAD and circumflex. Mildly dilated ascending thoracic aorta, measuring up to 4.0 cm unchanged when compared with prior exam. Severe calcified plaque of the thoracic aorta. Mediastinum/Nodes: Esophagus and thyroid are unremarkable. No pathologically enlarged lymph nodes seen in the chest. Lungs/Pleura: Debris seen in the left mainstem bronchus and posterior left lower lobe bronchi. Mild left lower lobe ground-glass opacities. New small ground-glass nodule of the left lower lobe measuring 7 mm on series 2, image 86. Subpleural nodular opacity of the left upper lobe located on series 4, image 83 unchanged when compared with prior chest CTs and likely to scarring. Bibasilar atelectasis. Small left-greater-than-right pleural effusions. Upper Abdomen: Small volume abdominal ascites. Musculoskeletal: Moderate age indeterminate compression deformities of T8 and T12, new when compared with April 27, 2021 exam. IMPRESSION: 1. Debris seen in the left mainstem bronchus and posterior left lower lobe bronchi with mild left lower ground-glass opacities, findings are favored to be due to aspiration. 2. New small ground-glass nodule of the left lower lobe measuring 7 mm, likely due to aspiration as described above. Initial follow-up with CT at 6 months is recommended to confirm persistence. If persistent, repeat CT is recommended every 2 years until 5 years of stability has been established. This recommendation follows the consensus statement: Guidelines for Management of Incidental Pulmonary Nodules  Detected on CT Images: From the Fleischner Society 2017; Radiology 2017; 284:228-243. 3. Small left-greater-than-right pleural effusions and partially visualized small volume abdominal ascites. 4. Moderate age indeterminate compression deformities of T8 and T12, new when compared with April 27, 2021 exam. Correlate for point tenderness. 5. Aortic Atherosclerosis (ICD10-I70.0) and Emphysema (ICD10-J43.9). Electronically Signed   By: Yetta Glassman M.D.   On: 08/28/2022 15:39   DG Chest Port 1 View  Result Date: 08/28/2022 CLINICAL DATA:  Provided history: Questionable sepsis-evaluate for abnormality. Additional history provided: Shortness of breath, diarrhea. History of CHF, COPD,  pneumonia. Current smoker. EXAM: PORTABLE CHEST 1 VIEW COMPARISON:  Prior chest radiographs 07/29/2022 and earlier. Chest CT 04/27/2021. FINDINGS: Heart size within normal limits. Prior TAVR. Aortic atherosclerosis. Multifocal opacity within the left mid and lower lung fields which may reflect atelectasis and/or pneumonia. Superimposed scarring within the left mid lung field. No evidence of pleural effusion or pneumothorax. No acute bony abnormality identified. Dextrocurvature of the thoracic spine. IMPRESSION: Multifocal opacity within the left mid and lower lung fields which may reflect atelectasis and/or pneumonia. Radiographic follow-up to resolution recommended to exclude underlying malignancy. Redemonstrated foci of scarring within the left mid lung field. Aortic Atherosclerosis (ICD10-I70.0). Electronically Signed   By: Kellie Simmering D.O.   On: 08/28/2022 13:29    Scheduled Meds:  aspirin  81 mg Oral Daily   brimonidine  1 drop Both Eyes TID   calcium carbonate  1 tablet Oral TID   chlorpheniramine-HYDROcodone  5 mL Oral Q12H   feeding supplement  237 mL Oral BID BM   guaiFENesin  1,200 mg Oral BID   heparin  5,000 Units Subcutaneous Q8H   insulin aspart  0-9 Units Subcutaneous TID WC   ipratropium-albuterol  3 mL  Nebulization TID   latanoprost  1 drop Both Eyes QHS   multivitamin with minerals  1 tablet Oral Q supper   pantoprazole  40 mg Oral Daily   pravastatin  40 mg Oral QPM   saccharomyces boulardii  250 mg Oral BID   sertraline  25 mg Oral Daily   timolol  1 drop Both Eyes QPM   Continuous Infusions:  ceFEPime (MAXIPIME) IV Stopped (08/29/22 2244)   magnesium sulfate bolus IVPB 4 g (08/30/22 0838)     LOS: 2 days   Time spent: 35 mins  Cami Delawder Wynetta Emery, MD How to contact the [facility] Attending or Consulting provider [place] or covering provider during after hours [place], for this patient?  Check the care team in [facility] and look for a) attending/consulting TRH provider listed and b) the [facility] team listed Log into [email or web address] and use [place]'s universal password to access. If you do not have the password, please contact the hospital operator. Locate the [facility] provider you are looking for under [facility] and page to a number that you can be directly reached. If you still have difficulty reaching the provider, please page the [facility] (Director on Call) for the Hospitalists listed on amion for assistance.  08/30/2022, 9:02 AM

## 2022-08-30 NOTE — TOC Progression Note (Signed)
Transition of Care [facility]) - Progression Note    Patient Details  Name: Patrick Beck MRN: [id number] Date of Birth: 03-Aug-1946  Transition of Care [facility]) CM/SW Contact  Ihor Gully, LCSW Phone Number: 08/30/2022, 4:00 PM  Clinical Narrative:    Patient and significant other, [name], advised of bed offer from [place] and accept.    Expected Discharge Plan: Skilled Nursing Facility Barriers to Discharge: Continued Medical Work up  Expected Discharge Plan and Services Expected Discharge Plan: [place]                                               Social Determinants of Health (SDOH) Interventions    Readmission Risk Interventions    08/02/2022   11:42 AM  Readmission Risk Prevention Plan  PCP or Specialist Appt within 5-7 Days Complete  Home Care Screening Complete  Medication Review (RN CM) Complete

## 2022-08-30 NOTE — TOC Progression Note (Signed)
Transition of Care [facility]) - Progression Note    Patient Details  Name: Patrick Beck MRN: [id number] Date of Birth: Oct 27, 1946  [facility]) CM/SW Contact  Ihor Gully, LCSW Phone Number: 08/30/2022, 1:39 PM  Clinical Narrative:    Spoke with patient's Medicaid worker at [facility], Ollen Barges, [phone]. Patient's medicaid application is pending. His pending medicaid number is [id number]. Ms. Rosana Hoes advises that there are a couple of things she needs to confirm with patient. She stated that she was awaiting a FL2 on patient and was unaware that he had d/c from facility. FL2 faxed to Ms. Davis at [phone]. Ebony Hail, at [facility] and rehab advised that patient has an automobile he will not get rid of and this is hindering progress with his Medicaid. She states that if they accept him back, he will have to pay 2 weeks copay of 196/day.    Expected Discharge Plan: Skilled Nursing Facility Barriers to Discharge: Continued Medical Work up  Expected Discharge Plan and Services Expected Discharge Plan: [place]                                               Social Determinants of Health (SDOH) Interventions    Readmission Risk Interventions    08/02/2022   11:42 AM  Readmission Risk Prevention Plan  PCP or Specialist Appt within 5-7 Days Complete  Home Care Screening Complete  Medication Review (RN CM) Complete

## 2022-08-30 NOTE — Progress Notes (Signed)
Speech Language Pathology Treatment: Dysphagia  Patient Details Name: Patrick Beck MRN: [id number] DOB: 1946-09-18 Today's Date: 08/30/2022 Time: 7824-2353 SLP Time Calculation (min) (ACUTE ONLY): 19 min  Assessment / Plan / Recommendation Clinical Impression  Ongoing diagnostic dysphagia treatment provided; Pt sat upright in the bed and accepted thin trials. Pt consumed thin liquids without overt s/sx of aspiration. Pt did demonstrate a delayed wet cough, however a congested cough was noted prior to any trials. Reinforced recommendations from BSE: Recommend D3/mech soft and thin liquids with standard aspiration and reflux precautions and PO medications whole with water when alert and upright. SLP reinforced the importance and masticating solids well and altering textures as needed to accommodate for edentulous status. Again, SLP Can complete MBSS if MD would like. ST will continue to follow.    HPI HPI: Patrick Beck is a 76 year old gentleman with type 2 diabetes mellitus, GERD, aortic stenosis status post TAVR, coronary artery disease, COPD, chronic tobacco use, peripheral neuropathy, PAD, glaucoma, hypertension, diverticulosis and ED presented to the emergency department after being sent by PCP office when he presented today with hypoxia.  His pulse ox was down to 74% and he appeared pale and lethargic.  He had presented for a hospital follow-up.  He had been hospitalized here approximately 3 weeks ago and treated for cellulitis.  He reports that he has been dealing with chronic diarrhea condition for approximately 1 month.  He says that he recently started smoking again after being discharged from the SNF facility.  He reports that he is having difficulty ambulating.  He also reports productive cough with yellow sputum.  He denies fever and chills.  He was noted to be hypotensive, with an AKI and afebrile.  He was started on sepsis protocol.  His chest x-ray confirmed findings of pneumonia.  He was also  noted to have electrolyte abnormalities including hypoglycemia, hypocalcemia and severe hypomagnesemia.  He has been started on treatment for severe dehydration, sepsis pneumonia and electrolyte abnormalities. BSE requested.      SLP Plan  Continue with current plan of care      Recommendations for follow up therapy are one component of a multi-disciplinary discharge planning process, led by the attending physician.  Recommendations may be updated based on patient status, additional functional criteria and insurance authorization.    Recommendations  Diet recommendations: Dysphagia 3 (mechanical soft);Thin liquid Liquids provided via: Straw;Cup Medication Administration: Whole meds with liquid Supervision: Patient able to self feed Compensations: Slow rate                Oral Care Recommendations: Oral care BID Follow Up Recommendations: Follow physician's recommendations for discharge plan and follow up therapies Assistance recommended at discharge: PRN SLP Visit Diagnosis: Dysphagia, unspecified (R13.10) Plan: Continue with current plan of care          Patrick Beck H. Roddie Mc, CCC-SLP Speech Language Pathologist  Patrick Beck  08/30/2022, 3:19 PM

## 2022-08-30 NOTE — Care Management Important Message (Signed)
Important Message  Patient Details  Name: Patrick Beck MRN: [id number] Date of Birth: Jun 25, 1946   Medicare Important Message Given:  Yes     [name] 08/30/2022, 12:40 PM

## 2022-08-30 NOTE — NC FL2 (Signed)
[place] LEVEL OF CARE SCREENING TOOL     IDENTIFICATION  Patient Name: Patrick Beck Birthdate: 1946/10/07 Sex: male Admission Date (Current Location): 08/28/2022  [facility] and [place] Number:  [id number] pending Medicaid number Facility and Address:  [facility] [address] [place]      Provider Number: [id number]  Attending Physician Name and Address:  Murlean Iba, MD  Relative Name and Phone Number:       Current Level of Care: Hospital Recommended Level of Care: [place] Prior Approval Number:    Date Approved/Denied:   PASRR Number: [id number]  Discharge Plan: SNF    Current Diagnoses: Patient Active Problem List   Diagnosis Date Noted   Failure to thrive in adult 08/29/2022   Sepsis due to pneumonia ([place]) 08/28/2022   Hypocalcemia 08/28/2022   Hypomagnesemia 08/28/2022   Leukocytosis 08/28/2022   Pressure injury of skin 07/31/2022   Left arm cellulitis 07/29/2022   Severe sepsis ([place]) 07/29/2022   Insomnia 07/29/2022   Chronic diastolic CHF (congestive heart failure) ([place]) 07/29/2022   Mixed hyperlipidemia 07/29/2022   AKI (acute kidney injury) ([place]) 07/29/2022   Dehydration 07/29/2022   Hyperkalemia 07/29/2022   Hypoalbuminemia due to protein-calorie malnutrition ([place]) 07/29/2022   Supratherapeutic INR 07/29/2022   Lactic acidosis 07/29/2022   Type 2 diabetes mellitus with hyperglycemia ([place]) 07/29/2022   GERD (gastroesophageal reflux disease) 07/29/2022   PAD (peripheral artery disease) ([place]) 06/26/2021   S/P TAVR (transcatheter aortic valve replacement) 06/26/2021   Retained dental root    Periodontal disease    Severe aortic stenosis    COPD (chronic obstructive pulmonary disease) ([place]) 10/22/2017   Tobacco dependence 10/22/2017   Type 2 diabetes mellitus without complication ([place]) 64/40/3474   Essential hypertension 10/22/2017   Glaucoma 10/22/2017    Orientation  RESPIRATION BLADDER Height & Weight     Self, Time, Situation, Place  O2 (3L) Continent Weight: 131 lb 6.3 oz (59.6 kg) Height:  6' (182.9 cm)  BEHAVIORAL SYMPTOMS/MOOD NEUROLOGICAL BOWEL NUTRITION STATUS      Continent Diet (DYS 3)  AMBULATORY STATUS COMMUNICATION OF NEEDS Skin   Extensive Assist Verbally PU Stage and Appropriate Care (Stage 2: back left; Pressure: vetebral column left)                       Personal Care Assistance Level of Assistance  Bathing, Dressing, Feeding Bathing Assistance: Limited assistance Feeding assistance: Independent Dressing Assistance: Limited assistance     Functional Limitations Info  Sight, Hearing, Speech Sight Info: Adequate   Speech Info: Adequate    SPECIAL CARE FACTORS FREQUENCY  PT (By licensed PT)     PT Frequency: 5x/week              Contractures Contractures Info: Not present    Additional Factors Info  Code Status, Allergies Code Status Info: DNR Allergies Info: Macadamia nut oil           Current Medications (08/30/2022):  This is the current hospital active medication list Current Facility-Administered Medications  Medication Dose Route Frequency Provider Last Rate Last Admin   acetaminophen (TYLENOL) tablet 650 mg  650 mg Oral Q6H PRN Wynetta Emery, Clanford L, MD   650 mg at 08/30/22 2595   Or   acetaminophen (TYLENOL) suppository 650 mg  650 mg Rectal Q6H PRN Johnson, Clanford L, MD       albuterol (PROVENTIL) (2.5 MG/3ML) 0.083% nebulizer solution  2.5 mg  2.5 mg Nebulization Q6H PRN Wynetta Emery, Clanford L, MD       aspirin chewable tablet 81 mg  81 mg Oral Daily Johnson, Clanford L, MD   81 mg at 08/30/22 0828   brimonidine (ALPHAGAN) 0.2 % ophthalmic solution 1 drop  1 drop Both Eyes TID Wynetta Emery, Clanford L, MD   1 drop at 08/30/22 0272   calcium carbonate (TUMS - dosed in mg elemental calcium) chewable tablet 200 mg of elemental calcium  1 tablet Oral TID Wynetta Emery, Clanford L, MD   200 mg of elemental calcium at  08/30/22 0827   ceFEPIme (MAXIPIME) 2 g in sodium chloride 0.9 % 100 mL IVPB  2 g Intravenous Q12H Johnson, Clanford L, MD 200 mL/hr at 08/30/22 1116 2 g at 08/30/22 1116   chlorpheniramine-HYDROcodone (TUSSIONEX) 10-8 MG/5ML suspension 5 mL  5 mL Oral Q12H Earlie Counts, NP   5 mL at 08/30/22 0957   dextromethorphan (DELSYM) 30 MG/5ML liquid 30 mg  30 mg Oral BID PRN Johnson, Clanford L, MD       feeding supplement (ENSURE ENLIVE / ENSURE PLUS) liquid 237 mL  237 mL Oral BID BM Johnson, Clanford L, MD       guaiFENesin (MUCINEX) 12 hr tablet 1,200 mg  1,200 mg Oral BID Johnson, Clanford L, MD   1,200 mg at 08/30/22 0828   heparin injection 5,000 Units  5,000 Units Subcutaneous Q8H Johnson, Clanford L, MD   5,000 Units at 08/30/22 0529   insulin aspart (novoLOG) injection 0-9 Units  0-9 Units Subcutaneous TID WC Johnson, Clanford L, MD   3 Units at 08/29/22 1737   ipratropium-albuterol (DUONEB) 0.5-2.5 (3) MG/3ML nebulizer solution 3 mL  3 mL Nebulization TID Wynetta Emery, Clanford L, MD   3 mL at 08/30/22 0732   latanoprost (XALATAN) 0.005 % ophthalmic solution 1 drop  1 drop Both Eyes QHS Johnson, Clanford L, MD   1 drop at 08/29/22 2203   loperamide (IMODIUM) capsule 2 mg  2 mg Oral PRN Earlie Counts, NP       multivitamin with minerals tablet 1 tablet  1 tablet Oral Q supper Wynetta Emery, Clanford L, MD   1 tablet at 08/29/22 1736   nicotine (NICODERM CQ - dosed in mg/24 hours) patch 14 mg  14 mg Transdermal Daily PRN Johnson, Clanford L, MD       ondansetron (ZOFRAN) tablet 4 mg  4 mg Oral Q6H PRN Johnson, Clanford L, MD       Or   ondansetron (ZOFRAN) injection 4 mg  4 mg Intravenous Q6H PRN Johnson, Clanford L, MD       oxyCODONE (Oxy IR/ROXICODONE) immediate release tablet 5 mg  5 mg Oral Q6H PRN Johnson, Clanford L, MD       pantoprazole (PROTONIX) EC tablet 40 mg  40 mg Oral Daily Johnson, Clanford L, MD   40 mg at 08/30/22 0828   pravastatin (PRAVACHOL) tablet 40 mg  40 mg Oral QPM Johnson,  Clanford L, MD   40 mg at 08/29/22 1736   saccharomyces boulardii (FLORASTOR) capsule 250 mg  250 mg Oral BID Johnson, Clanford L, MD   250 mg at 08/30/22 0828   sertraline (ZOLOFT) tablet 25 mg  25 mg Oral Daily Mariana Kaufman J, NP   25 mg at 08/30/22 0828   timolol (TIMOPTIC) 0.5 % ophthalmic solution 1 drop  1 drop Both Eyes QPM Johnson, Clanford L, MD   1 drop at 08/29/22 2203  traZODone (DESYREL) tablet 25 mg  25 mg Oral QHS PRN Murlean Iba, MD         Discharge Medications: Please see discharge summary for a list of discharge medications.  Relevant Imaging Results:  Relevant Lab Results:   Additional Information SSN: [id number] Clydene Pugh, LCSW

## 2022-08-30 NOTE — Progress Notes (Addendum)
Initial Nutrition Assessment  DOCUMENTATION CODES:   Severe malnutrition in context of chronic illness  INTERVENTION:  Ensure Enlive po BID, each supplement provides 350 kcal and 20 grams of protein.   Encourage meals and supplements  Assist with meals as needed  NUTRITION DIAGNOSIS:   Severe Malnutrition related to chronic illness as evidenced by severe fat depletion, severe muscle depletion.   GOAL:  Patient will meet greater than or equal to 90% of their needs   MONITOR:  PO intake, Supplement acceptance, Labs, Skin, Weight trends  REASON FOR ASSESSMENT:  Consult -Assess status  Malnutrition Screening Tool    ASSESSMENT: Patient is a 76 yo male with hx of COPD, DM2, PAD, CAD, HTN, GERD, Pneumonia and CHF. Presents with sepsis due to pneumonia, AKI, FTT and chronic diarrhea.   Patient is sleeping no family present. His lunch is here untouched and a large bag of popcorn on his tray table. Able to feed himself. Patient says significant other helps with meals at home and likes [place], pasta and steak/potatoes. Reports good appetite but has not demonstated this as yet.  Weight last July 59.9 kg and currently 59.6 kg. Chronically underweight.  Medications: calcium carbonate, insulin. IV-Maxipime and magnesium sulfate.      Latest Ref Rng & Units 08/30/2022    5:28 AM 08/29/2022    4:40 AM 08/28/2022    1:04 PM  BMP  Glucose 70 - 99 mg/dL 78  230  57   BUN 8 - 23 mg/dL 45  46  49   Creatinine 0.61 - 1.24 mg/dL 1.27  1.56  1.75   Sodium 135 - 145 mmol/L 135  135  136   Potassium 3.5 - 5.1 mmol/L 4.1  3.9  4.1   Chloride 98 - 111 mmol/L 106  103  103   CO2 22 - 32 mmol/L '24  26  21   '$ Calcium 8.9 - 10.3 mg/dL 6.4  6.1  5.8       NUTRITION - FOCUSED PHYSICAL EXAM:  Flowsheet Row Most Recent Value  Orbital Region Severe depletion  Upper Arm Region Moderate depletion  Thoracic and Lumbar Region Severe depletion  Buccal Region Moderate depletion  Temple Region Moderate  depletion  Clavicle Bone Region Severe depletion  Clavicle and Acromion Bone Region Severe depletion  Scapular Bone Region Severe depletion  Patellar Region Moderate depletion  Anterior Thigh Region Severe depletion  Posterior Calf Region Moderate depletion  Edema (RD Assessment) Mild-moderate  Hair Reviewed- thinning   Eyes Reviewed  Mouth Reviewed  Skin Reviewed        Diet Order:   Diet Order             DIET DYS 3 Room service appropriate? Yes; Fluid consistency: Thin  Diet effective now                   EDUCATION NEEDS:  Education needs have been addressed  Skin:  Skin Assessment: Reviewed RN Assessment  Last BM:  8/31 type 4 small  Height:   Ht Readings from Last 1 Encounters:  08/28/22 6' (1.829 m)    Weight:   Wt Readings from Last 1 Encounters:  08/30/22 59.6 kg    Ideal Body Weight:   81 kg  BMI:  Body mass index is 17.82 kg/m.  Estimated Nutritional Needs:   Kcal:  2000-2200  Protein:  84-96 gr  Fluid:  1800 ml daily   Colman Cater MS,RD,CSG,LDN Contact: Shea Evans

## 2022-08-31 DIAGNOSIS — H409 Unspecified glaucoma: Secondary | ICD-10-CM

## 2022-08-31 DIAGNOSIS — E43 Unspecified severe protein-calorie malnutrition: Secondary | ICD-10-CM | POA: Insufficient documentation

## 2022-08-31 DIAGNOSIS — I5032 Chronic diastolic (congestive) heart failure: Secondary | ICD-10-CM | POA: Diagnosis not present

## 2022-08-31 DIAGNOSIS — R627 Adult failure to thrive: Secondary | ICD-10-CM

## 2022-08-31 DIAGNOSIS — N179 Acute kidney failure, unspecified: Secondary | ICD-10-CM | POA: Diagnosis not present

## 2022-08-31 DIAGNOSIS — J189 Pneumonia, unspecified organism: Secondary | ICD-10-CM | POA: Diagnosis not present

## 2022-08-31 DIAGNOSIS — E860 Dehydration: Secondary | ICD-10-CM | POA: Diagnosis not present

## 2022-08-31 LAB — GLUCOSE, CAPILLARY
Glucose-Capillary: 28 mg/dL — CL (ref 70–99)
Glucose-Capillary: 31 mg/dL — CL (ref 70–99)
Glucose-Capillary: 42 mg/dL — CL (ref 70–99)
Glucose-Capillary: 75 mg/dL (ref 70–99)
Glucose-Capillary: 177 mg/dL — ABNORMAL HIGH (ref 70–99)
Glucose-Capillary: 79 mg/dL (ref 70–99)
Glucose-Capillary: 212 mg/dL — ABNORMAL HIGH (ref 70–99)
Glucose-Capillary: 193 mg/dL — ABNORMAL HIGH (ref 70–99)
Glucose-Capillary: 311 mg/dL — ABNORMAL HIGH (ref 70–99)

## 2022-08-31 MED ORDER — ENSURE ENLIVE PO LIQD: 237.0000 mL | Freq: Two times a day (BID) | ORAL | 12 refills | Status: AC

## 2022-08-31 MED ORDER — DEXTROSE 50 % IV SOLN
25.0000 mL | Freq: Once | INTRAVENOUS | Status: AC
  Administered 2022-08-31: 25 mL via INTRAVENOUS

## 2022-08-31 MED ORDER — GUAIFENESIN ER 600 MG PO TB12: 1200.0000 mg | ORAL_TABLET | Freq: Two times a day (BID) | ORAL | 0 refills | Status: AC

## 2022-08-31 MED ORDER — SERTRALINE HCL 25 MG PO TABS: 25.0000 mg | ORAL_TABLET | Freq: Every day | ORAL | Status: AC

## 2022-08-31 MED ORDER — DEXTROMETHORPHAN POLISTIREX ER 30 MG/5ML PO SUER: 30.0000 mg | Freq: Two times a day (BID) | ORAL | 0 refills | Status: AC | PRN

## 2022-08-31 MED ORDER — SACCHAROMYCES BOULARDII 250 MG PO CAPS: 250.0000 mg | ORAL_CAPSULE | Freq: Two times a day (BID) | ORAL | 0 refills | Status: AC

## 2022-08-31 MED ORDER — DEXTROSE 50 % IV SOLN
INTRAVENOUS | Status: AC
  Filled 2022-08-31: qty 50

## 2022-08-31 MED ORDER — IPRATROPIUM-ALBUTEROL 0.5-2.5 (3) MG/3ML IN SOLN: 3.0000 mL | Freq: Three times a day (TID) | RESPIRATORY_TRACT | Status: AC

## 2022-08-31 MED ORDER — LINAGLIPTIN 5 MG PO TABS: 5.0000 mg | ORAL_TABLET | Freq: Every day | ORAL | Status: AC

## 2022-08-31 MED ORDER — INSULIN ASPART 100 UNIT/ML IJ SOLN
2.0000 [IU] | Freq: Once | INTRAMUSCULAR | Status: AC
  Administered 2022-08-31: 2 [IU] via SUBCUTANEOUS

## 2022-08-31 MED ORDER — DEXTROSE 50 % IV SOLN
INTRAVENOUS | Status: AC
  Administered 2022-08-31: 25 mL via INTRAVENOUS
  Filled 2022-08-31: qty 50

## 2022-08-31 MED ORDER — CALCIUM CARBONATE ANTACID 500 MG PO CHEW: 1.0000 | CHEWABLE_TABLET | Freq: Two times a day (BID) | ORAL | Status: AC

## 2022-08-31 MED ORDER — DOXYCYCLINE HYCLATE 100 MG PO CAPS: 100.0000 mg | ORAL_CAPSULE | Freq: Two times a day (BID) | ORAL | 0 refills | Status: AC

## 2022-08-31 NOTE — Progress Notes (Signed)
[facility] and spoke to a nurse there, she said as far as she was aware there is no cut off times for when they accept their patients back.

## 2022-08-31 NOTE — Progress Notes (Signed)
Pt discharged to [facility] via EMS, attempted to call SNF but no one answered the phone. Per day RN, report had been given to the day RN at the facility.

## 2022-08-31 NOTE — Progress Notes (Addendum)
Date and time results received: 08/31/22 0415  Test: Blood Glucose Critical Value: 31  Name of Provider Notified: Asia Zierle-Ghosh  Orders Received? Or Actions Taken?: Dextrose 50% solution given.   Rechecked Blood Glucose: 75

## 2022-08-31 NOTE — Discharge Summary (Signed)
Physician Discharge Summary  Patrick Beck WIO:[id number] DOB: 07/03/1946 DOA: 08/28/2022  PCP: Lemmie Evens, MD  Admit date: 08/28/2022 Discharge date: 08/31/2022  Disposition: SNF [facility]  Recommendations for Outpatient Follow-up:  Follow up with PCP in 2 weeks Please obtain BMP, Magnesium, CBC in 1-2 weeks Please follow up serum calcium level and DC calcium supplement if needed Please arrange for outpatient palliative medicine consultation Fall precautions recommended Consult to dietitian recommended  Please monitor blood glucose at least 3 times per day and PRN if not feeling well Please avoid tobacco usage Please assist with feeding, encourage oral eating and drinking  Discharge Condition: STABLE   CODE STATUS: DNR DIET: Dysphagia 3, thin liquid with dietary supplements   Brief Hospitalization Summary: Please see all hospital notes, images, labs for full details of the hospitalization. ADMISSION HPI:  76 year old gentleman with type 2 diabetes mellitus, GERD, aortic stenosis status post TAVR, coronary artery disease, COPD, chronic tobacco use, peripheral neuropathy, PAD, glaucoma, hypertension, diverticulosis and ED presented to the emergency department after being sent by PCP office when he presented today with hypoxia.  His pulse ox was down to 74% and he appeared pale and lethargic.  He had presented for a hospital follow-up.  He had been hospitalized here approximately 3 weeks ago and treated for cellulitis.  He reports that he has been dealing with chronic diarrhea condition for approximately 1 month.  He says that he recently started smoking again after being discharged from the SNF facility.  He reports that he is having difficulty ambulating.  He also reports productive cough with yellow sputum.  He denies fever and chills.  He was noted to be hypotensive, with an AKI and afebrile.  He was started on sepsis protocol.  His chest x-ray confirmed findings of pneumonia.  He was  also noted to have electrolyte abnormalities including hypoglycemia, hypocalcemia and severe hypomagnesemia.  He has been started on treatment for severe dehydration, sepsis pneumonia and electrolyte abnormalities.  HOSPITAL COURSE BY PROBLEM   Severe sepsis due to pneumonia - sepsis physiology resolved now - pt presenting with findings on CT scan in [place] consistent with aspiration  - he was treated with IV antibiotics and supportive measures - SLP evaluation for swallow function appreciated, continue dysphagia 3 diet - supplemental oxygen as needed  - sepsis protocol was ordered - treated and trending lactic acid to improvement to 2.1 - aspiration precautions ordered - complete course of antibiotics     AKI and dehydration  - prerenal from frequent diarrhea and ongoing use of diuretics - stop diuretics - IV fluid hydration was used to treat in hospital - renal function improved with hydration - recommend rechecking BMP in 1-2 weeks outpatient   Hypoglycemia - treated  - D50 given  - no recurrences reported since feeding patient   - follow CBG closely  - DC all insulin usage - recommend avoiding insulin usage in future - tradjenta 5 mg daily ordered for DM treatment   Hypocalcemia  - IV replacement given - started tums TID oral replacement   Hypomagnesemia  - IV replacement ordered - recheck Mg in 1-2 weeks    Leukocytosis  - follow CBC and treating pneumonia and sepsis aggressively  - WBC trending down with treatments   Glaucoma  - resume home medications   Tobacco  - Pt declines nicotine patch at this time - he had resumed smoking since DC from SNF recently   Generalized weakness and failure to thrive  -  significant other reports patient can barely walk at home and severely deconditioned  - he likely will need LTC placement as he was just released from SNF - he obviously has failure to thrive despite maximal medical treatments and will appropriately ask for a  palliative care consultation  - PT evaluation requested  - fall precautions  - Pt now DNR after palliative discussions   Type 2 DM with neurological complications - sensitive SSI coverage with frequent CBG monitoring ordered    Chronic diarrhea - we have not found evidence of this since admission - DC enteric precautions for now - stool for C diff order expired due to no diarrhea - GI pathogen panel ordered but expired with no diarrhea - IV fluid rehydration  - replacing electrolytes - this was likely due to metformin usage which has been discontinued     GERD  - protonix ordered for GI protection   Discharge Diagnoses:  Principal Problem:   Sepsis due to pneumonia ([place]) Active Problems:   COPD (chronic obstructive pulmonary disease) ([place])   Tobacco dependence   Type 2 diabetes mellitus without complication ([place])   Essential hypertension   Glaucoma   Severe aortic stenosis   PAD (peripheral artery disease) ([place])   S/P TAVR (transcatheter aortic valve replacement)   Severe sepsis (H[facility])   Chronic diastolic CHF (congestive heart failure) (H[facility])   Mixed hyperlipidemia   AKI (acute kidney injury) ([place])   Dehydration   Hypoalbuminemia due to protein-calorie malnutrition (H[facility])   Lactic acidosis   GERD (gastroesophageal reflux disease)   Pressure injury of skin   Hypocalcemia   Hypomagnesemia   Leukocytosis   Failure to thrive in adult   Protein-calorie malnutrition, severe   Discharge Instructions:  Allergies as of 08/31/2022       Reactions   Macadamia Nut Oil Anaphylaxis   Throat swelling and can't breathe        Medication List     STOP taking these medications    furosemide 40 MG tablet Commonly known as: LASIX   glimepiride 1 MG tablet Commonly known as: AMARYL   hydrochlorothiazide 12.5 MG tablet Commonly known as: HYDRODIURIL   ibuprofen 200 MG tablet Commonly known as: ADVIL   Melatonin 10 MG Caps   metFORMIN 1000 MG tablet Commonly known  as: GLUCOPHAGE   spironolactone 25 MG tablet Commonly known as: ALDACTONE       TAKE these medications    acetaminophen 325 MG tablet Commonly known as: TYLENOL Take 2 tablets (650 mg total) by mouth every 6 (six) hours as needed for mild pain (or Fever >/= 101).   aspirin 81 MG chewable tablet Chew 1 tablet (81 mg total) by mouth daily. Can swallow whole   brimonidine 0.2 % ophthalmic solution Commonly known as: ALPHAGAN Place 1 drop into both eyes 3 (three) times daily.   calcium carbonate 500 MG chewable tablet Commonly known as: TUMS - dosed in mg elemental calcium Chew 1 tablet (200 mg of elemental calcium total) by mouth 2 (two) times daily with a meal.   dextromethorphan 30 MG/5ML liquid Commonly known as: DELSYM Take 5 mLs (30 mg total) by mouth 2 (two) times daily as needed for cough.   doxycycline 100 MG capsule Commonly known as: VIBRAMYCIN Take 1 capsule (100 mg total) by mouth 2 (two) times daily for 4 days.   feeding supplement Liqd Take 237 mLs by mouth 2 (two) times daily between meals.   guaiFENesin 600 MG 12 hr tablet Commonly  known as: MUCINEX Take 2 tablets (1,200 mg total) by mouth 2 (two) times daily for 5 days.   ipratropium-albuterol 0.5-2.5 (3) MG/3ML Soln Commonly known as: DUONEB Take 3 mLs by nebulization 3 (three) times daily.   latanoprost 0.005 % ophthalmic solution Commonly known as: XALATAN Place 1 drop into both eyes at bedtime.   linagliptin 5 MG Tabs tablet Commonly known as: Tradjenta Take 1 tablet (5 mg total) by mouth daily.   loperamide 2 MG capsule Commonly known as: IMODIUM Take 1 capsule (2 mg total) by mouth as needed for diarrhea or loose stools.   magnesium oxide 400 (240 Mg) MG tablet Commonly known as: MAG-OX Take 400 mg by mouth in the morning.   multivitamin with minerals Tabs tablet Take 1 tablet by mouth daily with supper.   pantoprazole 40 MG tablet Commonly known as: PROTONIX TAKE 1 TABLET BY MOUTH  EVERY DAY   pravastatin 40 MG tablet Commonly known as: PRAVACHOL Take 40 mg by mouth every evening.   saccharomyces boulardii 250 MG capsule Commonly known as: FLORASTOR Take 1 capsule (250 mg total) by mouth 2 (two) times daily.   sertraline 25 MG tablet Commonly known as: ZOLOFT Take 1 tablet (25 mg total) by mouth daily. Start taking on: September 01, 2022   timolol 0.5 % ophthalmic solution Commonly known as: TIMOPTIC Place 1 drop into both eyes every evening.        Contact information for follow-up providers     Lemmie Evens, MD. Schedule an appointment as soon as possible for a visit in 2 week(s).   Specialty: Family Medicine Why: Hospital Follow Up Contact information: [place] [address] [phone]         Satira Sark, MD .   Specialty: Cardiology Contact information: [place] [address] [phone]              Contact information for after-discharge care     [facility] .   Service: Skilled Nursing Contact information: [place] [address] [phone]                    Allergies  Allergen Reactions   Macadamia Nut Oil Anaphylaxis    Throat swelling and can't breathe   Allergies as of 08/31/2022       Reactions   Macadamia Nut Oil Anaphylaxis   Throat swelling and can't breathe        Medication List     STOP taking these medications    furosemide 40 MG tablet Commonly known as: LASIX   glimepiride 1 MG tablet Commonly known as: AMARYL   hydrochlorothiazide 12.5 MG tablet Commonly known as: HYDRODIURIL   ibuprofen 200 MG tablet Commonly known as: ADVIL   Melatonin 10 MG Caps   metFORMIN 1000 MG tablet Commonly known as: GLUCOPHAGE   spironolactone 25 MG tablet Commonly known as: ALDACTONE       TAKE these medications    acetaminophen 325 MG tablet Commonly known as:  TYLENOL Take 2 tablets (650 mg total) by mouth every 6 (six) hours as needed for mild pain (or Fever >/= 101).   aspirin 81 MG chewable tablet Chew 1 tablet (81 mg total) by mouth daily. Can swallow whole   brimonidine 0.2 % ophthalmic solution Commonly known as: ALPHAGAN Place 1 drop into both eyes 3 (three) times daily.   calcium carbonate 500  MG chewable tablet Commonly known as: TUMS - dosed in mg elemental calcium Chew 1 tablet (200 mg of elemental calcium total) by mouth 2 (two) times daily with a meal.   dextromethorphan 30 MG/5ML liquid Commonly known as: DELSYM Take 5 mLs (30 mg total) by mouth 2 (two) times daily as needed for cough.   doxycycline 100 MG capsule Commonly known as: VIBRAMYCIN Take 1 capsule (100 mg total) by mouth 2 (two) times daily for 4 days.   feeding supplement Liqd Take 237 mLs by mouth 2 (two) times daily between meals.   guaiFENesin 600 MG 12 hr tablet Commonly known as: MUCINEX Take 2 tablets (1,200 mg total) by mouth 2 (two) times daily for 5 days.   ipratropium-albuterol 0.5-2.5 (3) MG/3ML Soln Commonly known as: DUONEB Take 3 mLs by nebulization 3 (three) times daily.   latanoprost 0.005 % ophthalmic solution Commonly known as: XALATAN Place 1 drop into both eyes at bedtime.   linagliptin 5 MG Tabs tablet Commonly known as: Tradjenta Take 1 tablet (5 mg total) by mouth daily.   loperamide 2 MG capsule Commonly known as: IMODIUM Take 1 capsule (2 mg total) by mouth as needed for diarrhea or loose stools.   magnesium oxide 400 (240 Mg) MG tablet Commonly known as: MAG-OX Take 400 mg by mouth in the morning.   multivitamin with minerals Tabs tablet Take 1 tablet by mouth daily with supper.   pantoprazole 40 MG tablet Commonly known as: PROTONIX TAKE 1 TABLET BY MOUTH EVERY DAY   pravastatin 40 MG tablet Commonly known as: PRAVACHOL Take 40 mg by mouth every evening.   saccharomyces boulardii 250 MG capsule Commonly known  as: FLORASTOR Take 1 capsule (250 mg total) by mouth 2 (two) times daily.   sertraline 25 MG tablet Commonly known as: ZOLOFT Take 1 tablet (25 mg total) by mouth daily. Start taking on: September 01, 2022   timolol 0.5 % ophthalmic solution Commonly known as: TIMOPTIC Place 1 drop into both eyes every evening.        Procedures/Studies: CT Chest Wo Contrast  Result Date: 08/28/2022 CLINICAL DATA:  Pneumonia EXAM: CT CHEST WITHOUT CONTRAST TECHNIQUE: Multidetector CT imaging of the chest was performed following the standard protocol without IV contrast. RADIATION DOSE REDUCTION: This exam was performed according to the departmental dose-optimization program which includes automated exposure control, adjustment of the mA and/or kV according to patient size and/or use of iterative reconstruction technique. COMPARISON:  Chest x-ray dated August 30th 2023; CT chest angio dated April 27, 2021; chest CT dated March 01, 2019 FINDINGS: Cardiovascular: Normal heart size. Prior transcatheter aortic valve replacement. Trace pericardial effusion. Moderate coronary artery calcifications of the LAD and circumflex. Mildly dilated ascending thoracic aorta, measuring up to 4.0 cm unchanged when compared with prior exam. Severe calcified plaque of the thoracic aorta. Mediastinum/Nodes: Esophagus and thyroid are unremarkable. No pathologically enlarged lymph nodes seen in the chest. Lungs/Pleura: Debris seen in the left mainstem bronchus and posterior left lower lobe bronchi. Mild left lower lobe ground-glass opacities. New small ground-glass nodule of the left lower lobe measuring 7 mm on series 2, image 86. Subpleural nodular opacity of the left upper lobe located on series 4, image 83 unchanged when compared with prior chest CTs and likely to scarring. Bibasilar atelectasis. Small left-greater-than-right pleural effusions. Upper Abdomen: Small volume abdominal ascites. Musculoskeletal: Moderate age indeterminate  compression deformities of T8 and T12, new when compared with April 27, 2021 exam. IMPRESSION: 1. Debris seen  in the left mainstem bronchus and posterior left lower lobe bronchi with mild left lower ground-glass opacities, findings are favored to be due to aspiration. 2. New small ground-glass nodule of the left lower lobe measuring 7 mm, likely due to aspiration as described above. Initial follow-up with CT at 6 months is recommended to confirm persistence. If persistent, repeat CT is recommended every 2 years until 5 years of stability has been established. This recommendation follows the consensus statement: Guidelines for Management of Incidental Pulmonary Nodules Detected on CT Images: From the Fleischner Society 2017; Radiology 2017; 284:228-243. 3. Small left-greater-than-right pleural effusions and partially visualized small volume abdominal ascites. 4. Moderate age indeterminate compression deformities of T8 and T12, new when compared with April 27, 2021 exam. Correlate for point tenderness. 5. Aortic Atherosclerosis (ICD10-I70.0) and Emphysema (ICD10-J43.9). Electronically Signed   By: Yetta Glassman M.D.   On: 08/28/2022 15:39   DG Chest Port 1 View  Result Date: 08/28/2022 CLINICAL DATA:  Provided history: Questionable sepsis-evaluate for abnormality. Additional history provided: Shortness of breath, diarrhea. History of CHF, COPD, pneumonia. Current smoker. EXAM: PORTABLE CHEST 1 VIEW COMPARISON:  Prior chest radiographs 07/29/2022 and earlier. Chest CT 04/27/2021. FINDINGS: Heart size within normal limits. Prior TAVR. Aortic atherosclerosis. Multifocal opacity within the left mid and lower lung fields which may reflect atelectasis and/or pneumonia. Superimposed scarring within the left mid lung field. No evidence of pleural effusion or pneumothorax. No acute bony abnormality identified. Dextrocurvature of the thoracic spine. IMPRESSION: Multifocal opacity within the left mid and lower lung fields  which may reflect atelectasis and/or pneumonia. Radiographic follow-up to resolution recommended to exclude underlying malignancy. Redemonstrated foci of scarring within the left mid lung field. Aortic Atherosclerosis (ICD10-I70.0). Electronically Signed   By: Kellie Simmering D.O.   On: 08/28/2022 13:29     Subjective: Pt wanting to eat breakfast, not delivered fast enough today, no SOB, no CP, cough improving.   Discharge Exam: Vitals:   08/31/22 0358 08/31/22 0734  BP: 113/61   Pulse: (!) 59   Resp: 18   Temp: (!) 97.4 F (36.3 C)   SpO2: 94% 99%   Vitals:   08/30/22 2203 08/31/22 0358 08/31/22 0500 08/31/22 0734  BP: 113/65 113/61    Pulse: 76 (!) 59    Resp: 20 18    Temp: (!) 97.5 F (36.4 C) (!) 97.4 F (36.3 C)    TempSrc:  Oral    SpO2: 95% 94%  99%  Weight:   59.3 kg   Height:       General exam: very frail, chronically ill appearing male, Appears calm and comfortable  Respiratory system: improving ales RLL RUL. Respiratory effort normal. Cardiovascular system: normal S1 & S2 heard. No JVD, murmurs, rubs, gallops or clicks. No pedal edema. Gastrointestinal system: Abdomen is nondistended, soft and nontender. No organomegaly or masses felt. Normal bowel sounds heard. Central nervous system: Alert and oriented. No focal neurological deficits. Extremities: Symmetric 5 x 5 power. Skin: No rashes, lesions or ulcers. Psychiatry: Judgement and insight appear normal. Mood & affect flat, to sad.    The results of significant diagnostics from this hospitalization (including imaging, microbiology, ancillary and laboratory) are listed below for reference.     Microbiology: Recent Results (from the past 240 hour(s))  Blood Culture (routine x 2)     Status: None (Preliminary result)   Collection Time: 08/28/22  1:04 PM   Specimen: BLOOD  Result Value Ref Range Status   Specimen Description BLOOD  BLOOD LEFT FOREARM  Final   Special Requests   Final    BOTTLES DRAWN AEROBIC AND  ANAEROBIC Blood Culture results may not be optimal due to an inadequate volume of blood received in culture bottles   Culture   Final    NO GROWTH 3 DAYS Performed at [facility], [address] [place], [place] [address]    Report Status PENDING  Incomplete  Blood Culture (routine x 2)     Status: None (Preliminary result)   Collection Time: 08/28/22  1:28 PM   Specimen: Right Antecubital; Blood  Result Value Ref Range Status   Specimen Description RIGHT ANTECUBITAL  Final   Special Requests   Final    BOTTLES DRAWN AEROBIC AND ANAEROBIC Blood Culture results may not be optimal due to an excessive volume of blood received in culture bottles   Culture   Final    NO GROWTH 3 DAYS Performed at [facility], [address], [place], [place] [address]    Report Status PENDING  Incomplete  MRSA Next Gen by PCR, Nasal     Status: None   Collection Time: 08/28/22  4:42 PM   Specimen: Nasal Mucosa; Nasal Swab  Result Value Ref Range Status   MRSA by PCR Next Gen NOT DETECTED NOT DETECTED Final    Comment: (NOTE) The GeneXpert MRSA Assay (FDA approved for NASAL specimens only), is one component of a comprehensive MRSA colonization surveillance program. It is not intended to diagnose MRSA infection nor to guide or monitor treatment for MRSA infections. Test performance is not FDA approved in patients less than 60 years old. Performed at [facility], [address], [place], [place] [address]      Labs: BNP (last 3 results) Recent Labs    08/28/22 1304  BNP 62.3   Basic Metabolic Panel: Recent Labs  Lab 08/28/22 1304 08/29/22 0440 08/30/22 0528  NA 136 135 135  K 4.1 3.9 4.1  CL 103 103 106  CO2 21* 26 24  GLUCOSE 57* 230* 78  BUN 49* 46* 45*  CREATININE 1.75* 1.56* 1.27*  CALCIUM 5.8* 6.1* 6.4*  MG 0.9* 1.5* 1.4*  PHOS  --  4.4  --    Liver Function Tests: Recent Labs  Lab 08/28/22 1304 08/29/22 0440 08/30/22 0528  AST 23 15 14*  ALT '21 17 15  '$ ALKPHOS 144* 123  113  BILITOT 0.6 0.8 0.7  PROT 4.3* 3.6* 3.4*  ALBUMIN 1.9* 1.5* <1.5*   No results for input(s): "LIPASE", "AMYLASE" in the last 168 hours. No results for input(s): "AMMONIA" in the last 168 hours. CBC: Recent Labs  Lab 08/28/22 1304 08/29/22 0440 08/30/22 0528  WBC 11.5* 10.2 8.8  NEUTROABS 9.5*  --   --   HGB 11.6* 9.7* 9.6*  HCT 35.7* 30.2* 30.3*  MCV 99.7 100.0 102.0*  PLT 171 174 158   Cardiac Enzymes: No results for input(s): "CKTOTAL", "CKMB", "CKMBINDEX", "TROPONINI" in the last 168 hours. BNP: Invalid input(s): "POCBNP" CBG: Recent Labs  Lab 08/31/22 0413 08/31/22 0430 08/31/22 0433 08/31/22 0721 08/31/22 1114  GLUCAP 75 79 177* 212* 193*   D-Dimer No results for input(s): "DDIMER" in the last 72 hours. Hgb A1c No results for input(s): "HGBA1C" in the last 72 hours. Lipid Profile No results for input(s): "CHOL", "HDL", "LDLCALC", "TRIG", "CHOLHDL", "LDLDIRECT" in the last 72 hours. Thyroid function studies Recent Labs    08/28/22 1604  TSH 4.470   Anemia work up No results for input(s): "  VITAMINB12", "FOLATE", "FERRITIN", "TIBC", "IRON", "RETICCTPCT" in the last 72 hours. Urinalysis    Component Value Date/Time   COLORURINE YELLOW 07/29/2022 1803   APPEARANCEUR CLEAR 07/29/2022 1803   LABSPEC 1.010 07/29/2022 1803   PHURINE 5.0 07/29/2022 1803   GLUCOSEU NEGATIVE 07/29/2022 1803   HGBUR NEGATIVE 07/29/2022 1803   BILIRUBINUR NEGATIVE 07/29/2022 1803   KETONESUR NEGATIVE 07/29/2022 1803   PROTEINUR NEGATIVE 07/29/2022 1803   NITRITE NEGATIVE 07/29/2022 1803   LEUKOCYTESUR NEGATIVE 07/29/2022 1803   Sepsis Labs Recent Labs  Lab 08/28/22 1304 08/29/22 0440 08/30/22 0528  WBC 11.5* 10.2 8.8   Microbiology Recent Results (from the past 240 hour(s))  Blood Culture (routine x 2)     Status: None (Preliminary result)   Collection Time: 08/28/22  1:04 PM   Specimen: BLOOD  Result Value Ref Range Status   Specimen Description BLOOD BLOOD  LEFT FOREARM  Final   Special Requests   Final    BOTTLES DRAWN AEROBIC AND ANAEROBIC Blood Culture results may not be optimal due to an inadequate volume of blood received in culture bottles   Culture   Final    NO GROWTH 3 DAYS Performed at [facility], [address], [place], [place] [address]    Report Status PENDING  Incomplete  Blood Culture (routine x 2)     Status: None (Preliminary result)   Collection Time: 08/28/22  1:28 PM   Specimen: Right Antecubital; Blood  Result Value Ref Range Status   Specimen Description RIGHT ANTECUBITAL  Final   Special Requests   Final    BOTTLES DRAWN AEROBIC AND ANAEROBIC Blood Culture results may not be optimal due to an excessive volume of blood received in culture bottles   Culture   Final    NO GROWTH 3 DAYS Performed at [facility], [address], [place], [place] [address]    Report Status PENDING  Incomplete  MRSA Next Gen by PCR, Nasal     Status: None   Collection Time: 08/28/22  4:42 PM   Specimen: Nasal Mucosa; Nasal Swab  Result Value Ref Range Status   MRSA by PCR Next Gen NOT DETECTED NOT DETECTED Final    Comment: (NOTE) The GeneXpert MRSA Assay (FDA approved for NASAL specimens only), is one component of a comprehensive MRSA colonization surveillance program. It is not intended to diagnose MRSA infection nor to guide or monitor treatment for MRSA infections. Test performance is not FDA approved in patients less than 40 years old. Performed at [facility], [address] [place], [place] [address]     Time coordinating discharge: 44 mins   SIGNED:  Irwin Brakeman, MD  [facility] 08/31/2022, 1:14 PM How to contact the [facility] Attending or Consulting provider [place] or covering provider during after hours [place], for this patient?  Check the care team in [facility] and look for a) attending/consulting [facility] provider listed and b) the [facility] team listed Log into [email or web address] and use [place] universal password to  access. If you do not have the password, please contact the hospital operator. Locate the [facility] provider you are looking for under [facility] and page to a number that you can be directly reached. If you still have difficulty reaching the provider, please page the [facility] (Director on Call) for the Hospitalists listed on amion for assistance.

## 2022-08-31 NOTE — TOC Progression Note (Addendum)
Transition of Care [facility]) - Progression Note    Patient Details  Name: Patrick Beck MRN: [id number] Date of Birth: 05/27/1946  Transition of Care [facility]) CM/SW Contact  Joanne Chars, [place] Phone Number: 08/31/2022, 8:41 AM  Clinical Narrative:   SNF auth approved in [place]: [id number], 5 days: 9/2-9/6.   1400: CSW spoke with pt about outpt palliative services and pt is interested.  Agreeable to receiving call from [facility] to describe the service.  Referral made to [facility] [place].   Expected Discharge Plan: Skilled Nursing Facility Barriers to Discharge: Continued Medical Work up  Expected Discharge Plan and Services Expected Discharge Plan: [place]                                               Social Determinants of Health (SDOH) Interventions    Readmission Risk Interventions    08/02/2022   11:42 AM  Readmission Risk Prevention Plan  PCP or Specialist Appt within 5-7 Days Complete  Home Care Screening Complete  Medication Review (RN CM) Complete

## 2022-08-31 NOTE — Discharge Instructions (Signed)
IMPORTANT INFORMATION: PAY CLOSE ATTENTION   PHYSICIAN DISCHARGE INSTRUCTIONS  Follow with Primary care provider  Knowlton, Steve, MD  and other consultants as instructed by your Hospitalist Physician  SEEK MEDICAL CARE OR RETURN TO EMERGENCY ROOM IF SYMPTOMS COME BACK, WORSEN OR NEW PROBLEM DEVELOPS   Please note: You were cared for by a hospitalist during your hospital stay. Every effort will be made to forward records to your primary care provider.  You can request that your primary care provider send for your hospital records if they have not received them.  Once you are discharged, your primary care physician will handle any further medical issues. Please note that NO REFILLS for any discharge medications will be authorized once you are discharged, as it is imperative that you return to your primary care physician (or establish a relationship with a primary care physician if you do not have one) for your post hospital discharge needs so that they can reassess your need for medications and monitor your lab values.  Please get a complete blood count and chemistry panel checked by your Primary MD at your next visit, and again as instructed by your Primary MD.  Get Medicines reviewed and adjusted: Please take all your medications with you for your next visit with your Primary MD  Laboratory/radiological data: Please request your Primary MD to go over all hospital tests and procedure/radiological results at the follow up, please ask your primary care provider to get all Hospital records sent to his/her office.  In some cases, they will be blood work, cultures and biopsy results pending at the time of your discharge. Please request that your primary care provider follow up on these results.  If you are diabetic, please bring your blood sugar readings with you to your follow up appointment with primary care.    Please call and make your follow up appointments as soon as possible.    Also Note  the following: If you experience worsening of your admission symptoms, develop shortness of breath, life threatening emergency, suicidal or homicidal thoughts you must seek medical attention immediately by calling 911 or calling your MD immediately  if symptoms less severe.  You must read complete instructions/literature along with all the possible adverse reactions/side effects for all the Medicines you take and that have been prescribed to you. Take any new Medicines after you have completely understood and accpet all the possible adverse reactions/side effects.   Do not drive when taking Pain medications or sleeping medications (Benzodiazepines)  Do not take more than prescribed Pain, Sleep and Anxiety Medications. It is not advisable to combine anxiety,sleep and pain medications without talking with your primary care practitioner  Special Instructions: If you have smoked or chewed Tobacco  in the last 2 yrs please stop smoking, stop any regular Alcohol  and or any Recreational drug use.  Wear Seat belts while driving.  Do not drive if taking any narcotic, mind altering or controlled substances or recreational drugs or alcohol.       

## 2022-08-31 NOTE — TOC Transition Note (Addendum)
Transition of Care [facility]) - CM/SW Discharge Note   Patient Details  Name: LONDON TARNOWSKI MRN: [id number] Date of Birth: [birth year]  Transition of Care [facility]) CM/SW Contact:  Joanne Chars, LCSW Phone Number: 08/31/2022, 1:37 PM   Clinical Narrative:  Pt discharging to [facility], room A4-1.  RN call report to  [phone].    Final next level of care: Skilled Nursing Facility Barriers to Discharge: Barriers Resolved   Patient Goals and CMS Choice        Discharge Placement              Patient chooses bed at:  [facility]) Patient to be transferred to facility by: [facility] Name of family member notified: friend [name] Patient and family notified of of transfer: 08/31/22  Discharge Plan and Services                                     Social Determinants of Health (SDOH) Interventions     Readmission Risk Interventions    08/02/2022   11:42 AM  Readmission Risk Prevention Plan  PCP or Specialist Appt within 5-7 Days Complete  Home Care Screening Complete  Medication Review (RN CM) Complete

## 2022-08-31 NOTE — Progress Notes (Signed)
Noted patient pale, had cough and congestion. Patient started coughing something up but would not spit into blue emesis bag. Encouraged patient to use blue bag. Patient reported no complaints. MD Patrick Beck made aware. No new orders.

## 2022-08-31 NOTE — Progress Notes (Signed)
Pharmacy Antibiotic Note  Patrick Beck is a 76 y.o. male admitted on 08/28/2022 with pneumonia.  Pharmacy has been consulted for vancomycin/cefepime dosing.  Plan: Cefepime 2000 mg IV every 12 hours. Monitor labs, c/s, and vanco level as indicated.  Height: 6' (182.9 cm) Weight: 59.3 kg (130 lb 11.7 oz) IBW/kg (Calculated) : 77.6  Temp (24hrs), Avg:97.7 F (36.5 C), Min:97.4 F (36.3 C), Max:98.2 F (36.8 C)  Recent Labs  Lab 08/28/22 1304 08/28/22 1447 08/28/22 1600 08/28/22 2010 08/29/22 0440 08/29/22 0726 08/30/22 0528  WBC 11.5*  --   --   --  10.2  --  8.8  CREATININE 1.75*  --   --   --  1.56*  --  1.27*  LATICACIDVEN 4.7* 4.8* 4.0* 6.1*  --  2.1*  --      Estimated Creatinine Clearance: 42.2 mL/min (A) (by C-G formula based on SCr of 1.27 mg/dL (H)).    Allergies  Allergen Reactions   Macadamia Nut Oil Anaphylaxis    Throat swelling and can't breathe    Antimicrobials this admission: Vanco 8/30 >> 8/31 Cefepime 8/30 >>   Microbiology results: 8/30 BCx: ngtd 8/30 MRSA PCR: neg  Thank you for allowing pharmacy to be a part of this patient's care.  Ramond Craver 08/31/2022 8:24 AM

## 2022-09-03 LAB — CULTURE, BLOOD (ROUTINE X 2)
Culture: NO GROWTH
Culture: NO GROWTH

## 2022-09-24 ENCOUNTER — Ambulatory Visit: Payer: Medicare Other | Admitting: Cardiology

## 2022-09-29 DEATH — deceased

## 2022-11-04 ENCOUNTER — Ambulatory Visit: Payer: Medicare Other | Admitting: Gastroenterology

## 2024-01-23 ENCOUNTER — Other Ambulatory Visit: Payer: Self-pay
